# Patient Record
Sex: Female | Born: 1945 | Race: White | Hispanic: No | Marital: Married | Smoking: Never smoker
Health system: Southern US, Community
[De-identification: ages and names within clinical notes are randomized; demographics above are authoritative.]

## PROBLEM LIST (undated history)

## (undated) DIAGNOSIS — K219 Gastro-esophageal reflux disease without esophagitis: Secondary | ICD-10-CM

## (undated) DIAGNOSIS — Z860100 Personal history of colon polyps, unspecified: Secondary | ICD-10-CM

## (undated) DIAGNOSIS — E785 Hyperlipidemia, unspecified: Secondary | ICD-10-CM

## (undated) DIAGNOSIS — M349 Systemic sclerosis, unspecified: Secondary | ICD-10-CM

## (undated) DIAGNOSIS — M810 Age-related osteoporosis without current pathological fracture: Secondary | ICD-10-CM

## (undated) DIAGNOSIS — G3184 Mild cognitive impairment, so stated: Secondary | ICD-10-CM

## (undated) DIAGNOSIS — R4189 Other symptoms and signs involving cognitive functions and awareness: Secondary | ICD-10-CM

## (undated) DIAGNOSIS — I7300 Raynaud's syndrome without gangrene: Secondary | ICD-10-CM

## (undated) DIAGNOSIS — E7800 Pure hypercholesterolemia, unspecified: Secondary | ICD-10-CM

## (undated) DIAGNOSIS — M25572 Pain in left ankle and joints of left foot: Secondary | ICD-10-CM

## (undated) DIAGNOSIS — I609 Nontraumatic subarachnoid hemorrhage, unspecified: Secondary | ICD-10-CM

## (undated) DIAGNOSIS — M21612 Bunion of left foot: Secondary | ICD-10-CM

## (undated) DIAGNOSIS — I10 Essential (primary) hypertension: Secondary | ICD-10-CM

## (undated) DIAGNOSIS — M2010 Hallux valgus (acquired), unspecified foot: Secondary | ICD-10-CM

## (undated) DIAGNOSIS — E0590 Thyrotoxicosis, unspecified without thyrotoxic crisis or storm: Secondary | ICD-10-CM

## (undated) DIAGNOSIS — H3530 Unspecified macular degeneration: Secondary | ICD-10-CM

## (undated) DIAGNOSIS — L821 Other seborrheic keratosis: Secondary | ICD-10-CM

## (undated) DIAGNOSIS — J310 Chronic rhinitis: Secondary | ICD-10-CM

## (undated) DIAGNOSIS — M109 Gout, unspecified: Secondary | ICD-10-CM

## (undated) HISTORY — DX: Systemic sclerosis, unspecified: M34.9

## (undated) HISTORY — PX: CHOLECYSTECTOMY: SHX55

## (undated) HISTORY — DX: Hallux valgus (acquired), unspecified foot: M20.10

## (undated) HISTORY — DX: Personal history of colon polyps, unspecified: Z86.0100

## (undated) HISTORY — PX: MASTECTOMY PARTIAL / LUMPECTOMY: SUR851

## (undated) HISTORY — DX: Mild cognitive impairment of uncertain or unknown etiology: G31.84

## (undated) HISTORY — DX: Raynaud's syndrome without gangrene: I73.00

## (undated) HISTORY — DX: Age-related osteoporosis without current pathological fracture: M81.0

## (undated) HISTORY — DX: Hyperlipidemia, unspecified: E78.5

## (undated) HISTORY — DX: Other seborrheic keratosis: L82.1

## (undated) HISTORY — DX: Personal history of colonic polyps: Z86.010

## (undated) HISTORY — DX: Nontraumatic subarachnoid hemorrhage, unspecified: I60.9

## (undated) HISTORY — DX: Unspecified macular degeneration: H35.30

## (undated) HISTORY — DX: Essential (primary) hypertension: I10

## (undated) HISTORY — DX: Pure hypercholesterolemia, unspecified: E78.00

## (undated) HISTORY — DX: Bunion of left foot: M21.612

## (undated) HISTORY — DX: Gastro-esophageal reflux disease without esophagitis: K21.9

## (undated) HISTORY — DX: Other symptoms and signs involving cognitive functions and awareness: R41.89

## (undated) HISTORY — DX: Gout, unspecified: M10.9

## (undated) HISTORY — DX: Chronic rhinitis: J31.0

## (undated) HISTORY — PX: TUBAL LIGATION: SHX77

## (undated) HISTORY — DX: Thyrotoxicosis, unspecified without thyrotoxic crisis or storm: E05.90

## (undated) HISTORY — DX: Pain in left ankle and joints of left foot: M25.572

---

## 1998-07-25 ENCOUNTER — Encounter: Admission: RE | Admit: 1998-07-25 | Discharge: 1998-10-23 | Payer: Self-pay | Admitting: Family Medicine

## 1998-08-08 ENCOUNTER — Other Ambulatory Visit: Admission: RE | Admit: 1998-08-08 | Discharge: 1998-08-08 | Payer: Self-pay | Admitting: Obstetrics and Gynecology

## 1998-10-27 ENCOUNTER — Ambulatory Visit (HOSPITAL_BASED_OUTPATIENT_CLINIC_OR_DEPARTMENT_OTHER): Admission: RE | Admit: 1998-10-27 | Discharge: 1998-10-27 | Payer: Self-pay | Admitting: General Surgery

## 1999-08-17 ENCOUNTER — Other Ambulatory Visit: Admission: RE | Admit: 1999-08-17 | Discharge: 1999-08-17 | Payer: Self-pay | Admitting: General Surgery

## 1999-08-30 ENCOUNTER — Other Ambulatory Visit: Admission: RE | Admit: 1999-08-30 | Discharge: 1999-08-30 | Payer: Self-pay | Admitting: Obstetrics and Gynecology

## 1999-10-25 ENCOUNTER — Encounter (INDEPENDENT_AMBULATORY_CARE_PROVIDER_SITE_OTHER): Payer: Self-pay | Admitting: *Deleted

## 1999-10-25 ENCOUNTER — Ambulatory Visit (HOSPITAL_BASED_OUTPATIENT_CLINIC_OR_DEPARTMENT_OTHER): Admission: RE | Admit: 1999-10-25 | Discharge: 1999-10-25 | Payer: Self-pay | Admitting: General Surgery

## 2000-09-16 ENCOUNTER — Other Ambulatory Visit: Admission: RE | Admit: 2000-09-16 | Discharge: 2000-09-16 | Payer: Self-pay | Admitting: Family Medicine

## 2001-03-23 ENCOUNTER — Other Ambulatory Visit: Admission: RE | Admit: 2001-03-23 | Discharge: 2001-03-23 | Payer: Self-pay | Admitting: Obstetrics and Gynecology

## 2001-04-30 ENCOUNTER — Ambulatory Visit (HOSPITAL_COMMUNITY): Admission: RE | Admit: 2001-04-30 | Discharge: 2001-04-30 | Payer: Self-pay | Admitting: Gastroenterology

## 2001-06-12 ENCOUNTER — Ambulatory Visit (HOSPITAL_COMMUNITY): Admission: RE | Admit: 2001-06-12 | Discharge: 2001-06-12 | Payer: Self-pay | Admitting: Rheumatology

## 2001-06-29 ENCOUNTER — Encounter: Admission: RE | Admit: 2001-06-29 | Discharge: 2001-06-29 | Payer: Self-pay | Admitting: Rheumatology

## 2001-06-29 ENCOUNTER — Encounter: Payer: Self-pay | Admitting: Rheumatology

## 2002-10-14 ENCOUNTER — Ambulatory Visit (HOSPITAL_COMMUNITY): Admission: RE | Admit: 2002-10-14 | Discharge: 2002-10-14 | Payer: Self-pay | Admitting: Rheumatology

## 2003-12-31 HISTORY — PX: BREAST EXCISIONAL BIOPSY: SUR124

## 2004-01-19 ENCOUNTER — Other Ambulatory Visit: Admission: RE | Admit: 2004-01-19 | Discharge: 2004-01-19 | Payer: Self-pay | Admitting: Family Medicine

## 2004-01-25 ENCOUNTER — Encounter: Admission: RE | Admit: 2004-01-25 | Discharge: 2004-01-25 | Payer: Self-pay | Admitting: Family Medicine

## 2004-03-13 ENCOUNTER — Ambulatory Visit (HOSPITAL_COMMUNITY): Admission: RE | Admit: 2004-03-13 | Discharge: 2004-03-13 | Payer: Self-pay | Admitting: Rheumatology

## 2005-06-20 ENCOUNTER — Encounter: Admission: RE | Admit: 2005-06-20 | Discharge: 2005-06-20 | Payer: Self-pay | Admitting: Rheumatology

## 2006-09-15 ENCOUNTER — Ambulatory Visit: Payer: Self-pay | Admitting: Family Medicine

## 2006-10-09 ENCOUNTER — Encounter: Payer: Self-pay | Admitting: Family Medicine

## 2006-11-05 ENCOUNTER — Ambulatory Visit: Payer: Self-pay | Admitting: Family Medicine

## 2006-11-18 ENCOUNTER — Ambulatory Visit: Payer: Self-pay | Admitting: Family Medicine

## 2006-11-18 LAB — CONVERTED CEMR LAB
Chloride: 107 meq/L (ref 96–112)
Creatinine, Ser: 0.9 mg/dL (ref 0.4–1.2)
Glomerular Filtration Rate, Af Am: 82 mL/min/{1.73_m2}
Glucose, Bld: 71 mg/dL (ref 70–99)
MCHC: 33.3 g/dL (ref 30.0–36.0)
MCV: 91 fL (ref 78.0–100.0)
RBC: 4.48 M/uL (ref 3.87–5.11)
RDW: 11.9 % (ref 11.5–14.6)
Sodium: 143 meq/L (ref 135–145)

## 2006-11-26 ENCOUNTER — Ambulatory Visit: Payer: Self-pay | Admitting: Family Medicine

## 2006-12-17 ENCOUNTER — Ambulatory Visit: Payer: Self-pay | Admitting: Family Medicine

## 2006-12-17 LAB — CONVERTED CEMR LAB
ALT: 20 units/L (ref 0–40)
Creatinine, Ser: 0.9 mg/dL (ref 0.4–1.2)
Creatinine,U: 221 mg/dL
HDL: 38.4 mg/dL — ABNORMAL LOW (ref >39.0)
LDL Cholesterol: 67 mg/dL (ref 0–99)
Potassium: 4.2 meq/L (ref 3.5–5.1)
Sodium: 144 meq/L (ref 135–145)
Triglyceride fasting, serum: 116 mg/dL (ref 0–149)
VLDL: 23 mg/dL (ref 0–40)
Vitamin B-12: 380 pg/mL (ref 211–911)

## 2006-12-25 ENCOUNTER — Encounter: Admission: RE | Admit: 2006-12-25 | Discharge: 2006-12-25 | Payer: Self-pay | Admitting: Family Medicine

## 2007-01-21 DIAGNOSIS — I10 Essential (primary) hypertension: Secondary | ICD-10-CM | POA: Insufficient documentation

## 2007-01-21 DIAGNOSIS — E785 Hyperlipidemia, unspecified: Secondary | ICD-10-CM | POA: Insufficient documentation

## 2007-01-21 DIAGNOSIS — E119 Type 2 diabetes mellitus without complications: Secondary | ICD-10-CM | POA: Insufficient documentation

## 2007-01-21 HISTORY — DX: Essential (primary) hypertension: I10

## 2007-01-21 HISTORY — DX: Type 2 diabetes mellitus without complications: E11.9

## 2007-01-21 HISTORY — DX: Hyperlipidemia, unspecified: E78.5

## 2007-02-17 ENCOUNTER — Encounter (INDEPENDENT_AMBULATORY_CARE_PROVIDER_SITE_OTHER): Payer: Self-pay | Admitting: Family Medicine

## 2007-02-17 ENCOUNTER — Other Ambulatory Visit: Admission: RE | Admit: 2007-02-17 | Discharge: 2007-02-17 | Payer: Self-pay | Admitting: Family Medicine

## 2007-02-17 ENCOUNTER — Ambulatory Visit: Payer: Self-pay | Admitting: Family Medicine

## 2007-02-17 LAB — CONVERTED CEMR LAB
ALT: 24 units/L (ref 0–40)
BUN: 8 mg/dL (ref 6–23)
Basophils Absolute: 0.2 10*3/uL — ABNORMAL HIGH (ref 0.0–0.1)
Cholesterol: 142 mg/dL (ref 0–200)
Eosinophils Absolute: 0.1 10*3/uL (ref 0.0–0.6)
GFR calc Af Amer: 94 mL/min
GFR calc non Af Amer: 78 mL/min
HDL: 39 mg/dL (ref >39.0)
Hemoglobin: 13.4 g/dL (ref 12.0–15.0)
Hgb A1c MFr Bld: 5.6 % (ref 4.6–6.0)
LDL Cholesterol: 79 mg/dL (ref 0–99)
Lymphocytes Relative: 14.9 % (ref 12.0–46.0)
MCHC: 33.2 g/dL (ref 30.0–36.0)
MCV: 90.9 fL (ref 78.0–100.0)
Monocytes Absolute: 0.5 10*3/uL (ref 0.2–0.7)
Monocytes Relative: 5.9 % (ref 3.0–11.0)
Neutro Abs: 6 10*3/uL (ref 1.4–7.7)
Potassium: 4.3 meq/L (ref 3.5–5.1)
RDW: 12 % (ref 11.5–14.6)

## 2007-02-23 ENCOUNTER — Ambulatory Visit: Payer: Self-pay | Admitting: Family Medicine

## 2007-02-27 ENCOUNTER — Ambulatory Visit: Payer: Self-pay | Admitting: Family Medicine

## 2007-04-24 ENCOUNTER — Ambulatory Visit: Payer: Self-pay | Admitting: Family Medicine

## 2007-05-27 ENCOUNTER — Encounter (INDEPENDENT_AMBULATORY_CARE_PROVIDER_SITE_OTHER): Payer: Self-pay | Admitting: Family Medicine

## 2007-05-27 ENCOUNTER — Encounter: Payer: Self-pay | Admitting: Family Medicine

## 2007-05-27 ENCOUNTER — Ambulatory Visit: Payer: Self-pay | Admitting: Family Medicine

## 2007-05-28 LAB — CONVERTED CEMR LAB
Calcium: 9 mg/dL (ref 8.4–10.5)
Chloride: 109 meq/L (ref 96–112)
Creatinine,U: 174.8 mg/dL
GFR calc Af Amer: 93 mL/min
GFR calc non Af Amer: 77 mL/min
Hgb A1c MFr Bld: 6.1 % — ABNORMAL HIGH (ref 4.6–6.0)
LDL Cholesterol: 115 mg/dL — ABNORMAL HIGH (ref 0–99)
Microalb Creat Ratio: 3.4 mg/g (ref 0.0–30.0)
Total CHOL/HDL Ratio: 4.3
VLDL: 21 mg/dL (ref 0–40)

## 2007-05-29 ENCOUNTER — Telehealth (INDEPENDENT_AMBULATORY_CARE_PROVIDER_SITE_OTHER): Payer: Self-pay | Admitting: *Deleted

## 2007-06-09 ENCOUNTER — Telehealth (INDEPENDENT_AMBULATORY_CARE_PROVIDER_SITE_OTHER): Payer: Self-pay | Admitting: *Deleted

## 2007-09-01 ENCOUNTER — Ambulatory Visit: Payer: Self-pay | Admitting: Family Medicine

## 2007-09-01 ENCOUNTER — Telehealth (INDEPENDENT_AMBULATORY_CARE_PROVIDER_SITE_OTHER): Payer: Self-pay | Admitting: *Deleted

## 2007-09-02 ENCOUNTER — Telehealth (INDEPENDENT_AMBULATORY_CARE_PROVIDER_SITE_OTHER): Payer: Self-pay | Admitting: *Deleted

## 2007-09-02 LAB — CONVERTED CEMR LAB
Chloride: 109 meq/L (ref 96–112)
GFR calc Af Amer: 93 mL/min
GFR calc non Af Amer: 77 mL/min
Glucose, Bld: 115 mg/dL — ABNORMAL HIGH (ref 70–99)
HDL: 34.6 mg/dL — ABNORMAL LOW (ref >39.0)
Hgb A1c MFr Bld: 5.7 % (ref 4.6–6.0)
LDL Cholesterol: 82 mg/dL (ref 0–99)
Sodium: 146 meq/L — ABNORMAL HIGH (ref 135–145)
Total CHOL/HDL Ratio: 4.3
VLDL: 33 mg/dL (ref 0–40)

## 2007-09-09 ENCOUNTER — Ambulatory Visit: Payer: Self-pay | Admitting: Family Medicine

## 2007-09-09 LAB — CONVERTED CEMR LAB: Potassium: 4.6 meq/L (ref 3.5–5.1)

## 2007-09-10 ENCOUNTER — Telehealth (INDEPENDENT_AMBULATORY_CARE_PROVIDER_SITE_OTHER): Payer: Self-pay | Admitting: *Deleted

## 2007-10-13 ENCOUNTER — Encounter (INDEPENDENT_AMBULATORY_CARE_PROVIDER_SITE_OTHER): Payer: Self-pay | Admitting: Family Medicine

## 2007-10-27 ENCOUNTER — Ambulatory Visit: Payer: Self-pay | Admitting: Family Medicine

## 2007-11-20 ENCOUNTER — Encounter (INDEPENDENT_AMBULATORY_CARE_PROVIDER_SITE_OTHER): Payer: Self-pay | Admitting: Family Medicine

## 2007-12-02 ENCOUNTER — Ambulatory Visit: Payer: Self-pay | Admitting: Family Medicine

## 2007-12-02 DIAGNOSIS — J309 Allergic rhinitis, unspecified: Secondary | ICD-10-CM

## 2007-12-02 HISTORY — DX: Allergic rhinitis, unspecified: J30.9

## 2007-12-03 ENCOUNTER — Encounter (INDEPENDENT_AMBULATORY_CARE_PROVIDER_SITE_OTHER): Payer: Self-pay | Admitting: Family Medicine

## 2007-12-03 LAB — CONVERTED CEMR LAB
CO2: 28 meq/L (ref 19–32)
Chloride: 104 meq/L (ref 96–112)
Creatinine, Ser: 0.6 mg/dL (ref 0.4–1.2)
Glucose, Bld: 144 mg/dL — ABNORMAL HIGH (ref 70–99)
Hgb A1c MFr Bld: 5.7 % (ref 4.6–6.0)
Potassium: 4.4 meq/L (ref 3.5–5.1)
Sodium: 140 meq/L (ref 135–145)

## 2007-12-04 ENCOUNTER — Telehealth (INDEPENDENT_AMBULATORY_CARE_PROVIDER_SITE_OTHER): Payer: Self-pay | Admitting: *Deleted

## 2007-12-04 ENCOUNTER — Encounter (INDEPENDENT_AMBULATORY_CARE_PROVIDER_SITE_OTHER): Payer: Self-pay | Admitting: *Deleted

## 2007-12-25 ENCOUNTER — Telehealth (INDEPENDENT_AMBULATORY_CARE_PROVIDER_SITE_OTHER): Payer: Self-pay | Admitting: *Deleted

## 2007-12-29 ENCOUNTER — Telehealth (INDEPENDENT_AMBULATORY_CARE_PROVIDER_SITE_OTHER): Payer: Self-pay | Admitting: *Deleted

## 2008-02-02 ENCOUNTER — Telehealth (INDEPENDENT_AMBULATORY_CARE_PROVIDER_SITE_OTHER): Payer: Self-pay | Admitting: *Deleted

## 2008-03-08 ENCOUNTER — Ambulatory Visit: Payer: Self-pay | Admitting: Family Medicine

## 2008-03-08 DIAGNOSIS — L821 Other seborrheic keratosis: Secondary | ICD-10-CM | POA: Insufficient documentation

## 2008-03-08 HISTORY — DX: Other seborrheic keratosis: L82.1

## 2008-03-08 LAB — CONVERTED CEMR LAB
AST: 23 units/L (ref 0–37)
BUN: 10 mg/dL (ref 6–23)
CO2: 31 meq/L (ref 19–32)
Calcium: 9.7 mg/dL (ref 8.4–10.5)
Creatinine,U: 344.2 mg/dL
GFR calc Af Amer: 109 mL/min
GFR calc non Af Amer: 90 mL/min
LDL Cholesterol: 72 mg/dL (ref 0–99)
Microalb Creat Ratio: 18.6 mg/g (ref 0.0–30.0)
Microalb, Ur: 6.4 mg/dL — ABNORMAL HIGH (ref 0.0–1.9)
Potassium: 5 meq/L (ref 3.5–5.1)
Total CHOL/HDL Ratio: 4
Triglycerides: 152 mg/dL — ABNORMAL HIGH (ref 0–149)
VLDL: 30 mg/dL (ref 0–40)

## 2008-03-09 ENCOUNTER — Encounter (INDEPENDENT_AMBULATORY_CARE_PROVIDER_SITE_OTHER): Payer: Self-pay | Admitting: *Deleted

## 2008-04-12 ENCOUNTER — Telehealth (INDEPENDENT_AMBULATORY_CARE_PROVIDER_SITE_OTHER): Payer: Self-pay | Admitting: *Deleted

## 2008-07-11 ENCOUNTER — Ambulatory Visit: Payer: Self-pay | Admitting: Family Medicine

## 2008-07-11 DIAGNOSIS — M349 Systemic sclerosis, unspecified: Secondary | ICD-10-CM | POA: Insufficient documentation

## 2008-07-22 ENCOUNTER — Telehealth (INDEPENDENT_AMBULATORY_CARE_PROVIDER_SITE_OTHER): Payer: Self-pay | Admitting: *Deleted

## 2008-07-22 ENCOUNTER — Encounter (INDEPENDENT_AMBULATORY_CARE_PROVIDER_SITE_OTHER): Payer: Self-pay | Admitting: *Deleted

## 2008-07-22 LAB — CONVERTED CEMR LAB
ALT: 25 units/L (ref 0–35)
CO2: 31 meq/L (ref 19–32)
Calcium: 9.8 mg/dL (ref 8.4–10.5)
Cholesterol: 147 mg/dL (ref 0–200)
Creatinine, Ser: 0.8 mg/dL (ref 0.4–1.2)
Creatinine,U: 81.2 mg/dL
GFR calc Af Amer: 93 mL/min
HDL: 32.5 mg/dL — ABNORMAL LOW (ref >39.0)
Microalb Creat Ratio: 2.5 mg/g (ref 0.0–30.0)
Microalb, Ur: 0.2 mg/dL (ref 0.0–1.9)
Sodium: 143 meq/L (ref 135–145)
Total Bilirubin: 1.1 mg/dL (ref 0.3–1.2)
Total CHOL/HDL Ratio: 4.5
Total Protein: 7.5 g/dL (ref 6.0–8.3)
Triglycerides: 203 mg/dL (ref 0–149)

## 2008-09-22 ENCOUNTER — Ambulatory Visit: Payer: Self-pay | Admitting: Family Medicine

## 2008-10-20 ENCOUNTER — Encounter (INDEPENDENT_AMBULATORY_CARE_PROVIDER_SITE_OTHER): Payer: Self-pay | Admitting: *Deleted

## 2008-10-20 ENCOUNTER — Ambulatory Visit: Payer: Self-pay | Admitting: Family Medicine

## 2008-10-20 ENCOUNTER — Encounter: Payer: Self-pay | Admitting: Family Medicine

## 2008-10-20 DIAGNOSIS — F329 Major depressive disorder, single episode, unspecified: Secondary | ICD-10-CM | POA: Insufficient documentation

## 2008-10-20 HISTORY — DX: Major depressive disorder, single episode, unspecified: F32.9

## 2008-10-20 LAB — CONVERTED CEMR LAB
Alkaline Phosphatase: 63 units/L (ref 39–117)
Bilirubin, Direct: 0.2 mg/dL (ref 0.0–0.3)
Chloride: 107 meq/L (ref 96–112)
GFR calc Af Amer: 81 mL/min
GFR calc non Af Amer: 67 mL/min
LDL Cholesterol: 77 mg/dL (ref 0–99)
Potassium: 4.4 meq/L (ref 3.5–5.1)
Sodium: 142 meq/L (ref 135–145)
Total Bilirubin: 1.1 mg/dL (ref 0.3–1.2)
VLDL: 28 mg/dL (ref 0–40)

## 2008-10-21 ENCOUNTER — Encounter: Payer: Self-pay | Admitting: Family Medicine

## 2008-10-25 ENCOUNTER — Telehealth (INDEPENDENT_AMBULATORY_CARE_PROVIDER_SITE_OTHER): Payer: Self-pay | Admitting: *Deleted

## 2009-01-13 ENCOUNTER — Telehealth (INDEPENDENT_AMBULATORY_CARE_PROVIDER_SITE_OTHER): Payer: Self-pay | Admitting: *Deleted

## 2009-01-26 ENCOUNTER — Ambulatory Visit: Payer: Self-pay | Admitting: Family Medicine

## 2009-01-26 DIAGNOSIS — R252 Cramp and spasm: Secondary | ICD-10-CM | POA: Insufficient documentation

## 2009-01-27 ENCOUNTER — Encounter (INDEPENDENT_AMBULATORY_CARE_PROVIDER_SITE_OTHER): Payer: Self-pay | Admitting: *Deleted

## 2009-01-27 LAB — CONVERTED CEMR LAB
BUN: 9 mg/dL (ref 6–23)
CO2: 31 meq/L (ref 19–32)
Chloride: 108 meq/L (ref 96–112)
GFR calc non Af Amer: 90 mL/min
Glucose, Bld: 111 mg/dL — ABNORMAL HIGH (ref 70–99)
Potassium: 4.6 meq/L (ref 3.5–5.1)

## 2009-02-27 ENCOUNTER — Telehealth (INDEPENDENT_AMBULATORY_CARE_PROVIDER_SITE_OTHER): Payer: Self-pay | Admitting: *Deleted

## 2009-03-02 ENCOUNTER — Telehealth (INDEPENDENT_AMBULATORY_CARE_PROVIDER_SITE_OTHER): Payer: Self-pay | Admitting: *Deleted

## 2009-03-03 ENCOUNTER — Telehealth (INDEPENDENT_AMBULATORY_CARE_PROVIDER_SITE_OTHER): Payer: Self-pay | Admitting: *Deleted

## 2009-03-14 ENCOUNTER — Encounter: Payer: Self-pay | Admitting: Family Medicine

## 2009-03-14 ENCOUNTER — Ambulatory Visit: Payer: Self-pay | Admitting: Family Medicine

## 2009-04-27 ENCOUNTER — Telehealth: Payer: Self-pay | Admitting: Family Medicine

## 2009-04-28 ENCOUNTER — Other Ambulatory Visit: Admission: RE | Admit: 2009-04-28 | Discharge: 2009-04-28 | Payer: Self-pay | Admitting: Family Medicine

## 2009-04-28 ENCOUNTER — Encounter: Payer: Self-pay | Admitting: Family Medicine

## 2009-04-28 ENCOUNTER — Ambulatory Visit: Payer: Self-pay | Admitting: Family Medicine

## 2009-05-01 ENCOUNTER — Encounter (INDEPENDENT_AMBULATORY_CARE_PROVIDER_SITE_OTHER): Payer: Self-pay | Admitting: *Deleted

## 2009-05-01 LAB — CONVERTED CEMR LAB
ALT: 21 units/L (ref 0–35)
Basophils Relative: 0.2 % (ref 0.0–3.0)
Chloride: 107 meq/L (ref 96–112)
Eosinophils Relative: 0.9 % (ref 0.0–5.0)
GFR calc non Af Amer: 66.97 mL/min (ref >60)
Glucose, Bld: 156 mg/dL — ABNORMAL HIGH (ref 70–99)
HDL: 40.9 mg/dL (ref >39.00)
LDL Cholesterol: 102 mg/dL — ABNORMAL HIGH (ref 0–99)
Lymphocytes Relative: 19.6 % (ref 12.0–46.0)
MCV: 92.6 fL (ref 78.0–100.0)
Neutrophils Relative %: 72.5 % (ref 43.0–77.0)
Potassium: 4.9 meq/L (ref 3.5–5.1)
RBC: 4.46 M/uL (ref 3.87–5.11)
Sodium: 144 meq/L (ref 135–145)
Total Bilirubin: 1.2 mg/dL (ref 0.3–1.2)
Total Protein: 7.5 g/dL (ref 6.0–8.3)
VLDL: 21.2 mg/dL (ref 0.0–40.0)
WBC: 6.4 10*3/uL (ref 4.5–10.5)

## 2009-05-03 ENCOUNTER — Encounter (INDEPENDENT_AMBULATORY_CARE_PROVIDER_SITE_OTHER): Payer: Self-pay | Admitting: *Deleted

## 2009-07-27 ENCOUNTER — Ambulatory Visit: Payer: Self-pay | Admitting: Family Medicine

## 2009-07-27 DIAGNOSIS — L989 Disorder of the skin and subcutaneous tissue, unspecified: Secondary | ICD-10-CM | POA: Insufficient documentation

## 2009-07-31 ENCOUNTER — Encounter (INDEPENDENT_AMBULATORY_CARE_PROVIDER_SITE_OTHER): Payer: Self-pay | Admitting: *Deleted

## 2009-07-31 LAB — CONVERTED CEMR LAB: Hgb A1c MFr Bld: 5.7 % (ref 4.6–6.5)

## 2009-08-09 ENCOUNTER — Telehealth (INDEPENDENT_AMBULATORY_CARE_PROVIDER_SITE_OTHER): Payer: Self-pay | Admitting: *Deleted

## 2009-08-21 ENCOUNTER — Telehealth (INDEPENDENT_AMBULATORY_CARE_PROVIDER_SITE_OTHER): Payer: Self-pay | Admitting: *Deleted

## 2009-09-28 ENCOUNTER — Ambulatory Visit: Payer: Self-pay | Admitting: Family Medicine

## 2009-10-23 ENCOUNTER — Encounter: Payer: Self-pay | Admitting: Family Medicine

## 2009-10-25 ENCOUNTER — Encounter (INDEPENDENT_AMBULATORY_CARE_PROVIDER_SITE_OTHER): Payer: Self-pay | Admitting: *Deleted

## 2009-11-14 ENCOUNTER — Ambulatory Visit: Payer: Self-pay | Admitting: Family

## 2009-11-14 LAB — CONVERTED CEMR LAB
ALT: 19 units/L (ref 0–35)
AST: 25 units/L (ref 0–37)
Chloride: 109 meq/L (ref 96–112)
HDL: 40.3 mg/dL (ref >39.00)
Hgb A1c MFr Bld: 5.5 % (ref 4.6–6.5)
Potassium: 5.2 meq/L — ABNORMAL HIGH (ref 3.5–5.1)
Triglycerides: 127 mg/dL (ref 0.0–149.0)

## 2009-11-15 ENCOUNTER — Encounter: Payer: Self-pay | Admitting: Family

## 2009-11-27 ENCOUNTER — Ambulatory Visit: Payer: Self-pay | Admitting: Family

## 2009-11-27 DIAGNOSIS — E875 Hyperkalemia: Secondary | ICD-10-CM | POA: Insufficient documentation

## 2009-11-27 HISTORY — DX: Hyperkalemia: E87.5

## 2009-11-27 LAB — CONVERTED CEMR LAB
CO2: 29 meq/L (ref 19–32)
Calcium: 9.2 mg/dL (ref 8.4–10.5)
Creatinine, Ser: 0.8 mg/dL (ref 0.4–1.2)
Glucose, Bld: 162 mg/dL — ABNORMAL HIGH (ref 70–99)
Sodium: 142 meq/L (ref 135–145)

## 2009-12-12 ENCOUNTER — Ambulatory Visit: Payer: Self-pay | Admitting: Family

## 2009-12-12 DIAGNOSIS — J329 Chronic sinusitis, unspecified: Secondary | ICD-10-CM | POA: Insufficient documentation

## 2009-12-12 HISTORY — DX: Chronic sinusitis, unspecified: J32.9

## 2010-01-25 ENCOUNTER — Telehealth (INDEPENDENT_AMBULATORY_CARE_PROVIDER_SITE_OTHER): Payer: Self-pay | Admitting: *Deleted

## 2010-02-13 ENCOUNTER — Encounter: Payer: Self-pay | Admitting: Family Medicine

## 2010-02-14 ENCOUNTER — Ambulatory Visit: Payer: Self-pay | Admitting: Family

## 2010-02-14 LAB — CONVERTED CEMR LAB
Alkaline Phosphatase: 76 units/L (ref 39–117)
Bilirubin, Direct: 0.1 mg/dL (ref 0.0–0.3)
CO2: 30 meq/L (ref 19–32)
Calcium: 9.2 mg/dL (ref 8.4–10.5)
Creatinine,U: 175.3 mg/dL
HDL: 45 mg/dL (ref >39.00)
Microalb Creat Ratio: 10.3 mg/g (ref 0.0–30.0)
Sodium: 144 meq/L (ref 135–145)
Total CHOL/HDL Ratio: 3
Total Protein: 7.2 g/dL (ref 6.0–8.3)

## 2010-03-14 ENCOUNTER — Ambulatory Visit: Payer: Self-pay | Admitting: Family

## 2010-03-29 ENCOUNTER — Encounter (INDEPENDENT_AMBULATORY_CARE_PROVIDER_SITE_OTHER): Payer: Self-pay | Admitting: *Deleted

## 2010-04-10 ENCOUNTER — Encounter: Payer: Self-pay | Admitting: Family Medicine

## 2010-04-19 ENCOUNTER — Ambulatory Visit: Payer: Self-pay | Admitting: Family Medicine

## 2010-04-19 ENCOUNTER — Telehealth (INDEPENDENT_AMBULATORY_CARE_PROVIDER_SITE_OTHER): Payer: Self-pay | Admitting: *Deleted

## 2010-04-19 DIAGNOSIS — M25579 Pain in unspecified ankle and joints of unspecified foot: Secondary | ICD-10-CM | POA: Insufficient documentation

## 2010-05-23 ENCOUNTER — Telehealth (INDEPENDENT_AMBULATORY_CARE_PROVIDER_SITE_OTHER): Payer: Self-pay | Admitting: *Deleted

## 2010-05-29 ENCOUNTER — Ambulatory Visit: Payer: Self-pay | Admitting: Family Medicine

## 2010-05-30 ENCOUNTER — Telehealth (INDEPENDENT_AMBULATORY_CARE_PROVIDER_SITE_OTHER): Payer: Self-pay | Admitting: *Deleted

## 2010-05-30 LAB — CONVERTED CEMR LAB: Hgb A1c MFr Bld: 6 % (ref 4.6–6.5)

## 2010-08-29 ENCOUNTER — Ambulatory Visit: Payer: Self-pay | Admitting: Family Medicine

## 2010-08-30 LAB — CONVERTED CEMR LAB
ALT: 18 units/L (ref 0–35)
AST: 19 units/L (ref 0–37)
Albumin: 3.7 g/dL (ref 3.5–5.2)
Basophils Absolute: 0.1 10*3/uL (ref 0.0–0.1)
CO2: 28 meq/L (ref 19–32)
Calcium: 9.2 mg/dL (ref 8.4–10.5)
Cholesterol: 145 mg/dL (ref 0–200)
Eosinophils Relative: 1.9 % (ref 0.0–5.0)
GFR calc non Af Amer: 66.69 mL/min (ref >60)
HDL: 40.1 mg/dL (ref >39.00)
Hgb A1c MFr Bld: 6.2 % (ref 4.6–6.5)
Monocytes Absolute: 0.4 10*3/uL (ref 0.1–1.0)
Monocytes Relative: 6.1 % (ref 3.0–12.0)
Neutrophils Relative %: 59.6 % (ref 43.0–77.0)
Platelets: 180 10*3/uL (ref 150.0–400.0)
RDW: 13.1 % (ref 11.5–14.6)
Sodium: 142 meq/L (ref 135–145)
TSH: 2.31 microintl units/mL (ref 0.35–5.50)
Total Protein: 6.9 g/dL (ref 6.0–8.3)
Triglycerides: 113 mg/dL (ref 0.0–149.0)
WBC: 5.9 10*3/uL (ref 4.5–10.5)

## 2010-10-24 ENCOUNTER — Encounter: Payer: Self-pay | Admitting: Family Medicine

## 2010-11-01 ENCOUNTER — Encounter (INDEPENDENT_AMBULATORY_CARE_PROVIDER_SITE_OTHER): Payer: Self-pay | Admitting: *Deleted

## 2010-11-14 ENCOUNTER — Ambulatory Visit: Payer: Self-pay | Admitting: Family Medicine

## 2010-11-14 DIAGNOSIS — J069 Acute upper respiratory infection, unspecified: Secondary | ICD-10-CM

## 2010-11-14 HISTORY — DX: Acute upper respiratory infection, unspecified: J06.9

## 2010-11-29 ENCOUNTER — Ambulatory Visit: Payer: Self-pay | Admitting: Family Medicine

## 2010-11-30 LAB — CONVERTED CEMR LAB: Hgb A1c MFr Bld: 6 % (ref 4.6–6.5)

## 2011-01-20 ENCOUNTER — Encounter: Payer: Self-pay | Admitting: Rheumatology

## 2011-01-22 ENCOUNTER — Telehealth (INDEPENDENT_AMBULATORY_CARE_PROVIDER_SITE_OTHER): Payer: Self-pay | Admitting: *Deleted

## 2011-01-29 NOTE — Assessment & Plan Note (Signed)
Summary: cough/cbs   Vital Signs:  Patient profile:   65 year old female Menstrual status:  postmenopausal Weight:      154 pounds O2 Sat:      98 % on Room air Temp:     98.5 degrees F oral Pulse rate:   94 / minute BP sitting:   124 / 80  (left arm)  Vitals Entered By: Doristine Devoid CMA (November 14, 2010 1:59 PM)  O2 Flow:  Room air CC: cough mainly dry but has been keeping pt up at night   History of Present Illness: 65 yo woman here today for cough.  sxs started Saturday w/ acid reflux but cough has persisted.  no fever.  mild nasal congestion, no ear pain.  cough is intermittantly productive.  + sick contacts.  cough keeping pt awake at night.  using [facility] w/out relief.  heading out of town this weekend.  Current Medications (verified): 1)  Glucotrol Xl 5 Mg  Tb24 (Glipizide) .... Take One Tablet By Mouth Daily 2)  Zocor 80 Mg  Tabs (Simvastatin) .... Take One Tablet Daily 3)  Prilosec 40 Mg  Cpdr (Omeprazole) .... Take 2  Tablets Daily 4)  Fluticasone Propionate 50 Mcg/act  Susp (Fluticasone Propionate) .... Spray 2 Sprays in Each Nostril Daily 5)  Onetouch Ultra Test  Strp (Glucose Blood) .... Test Twice Daily 6)  Bayer Aspirin 325 Mg  Tabs (Aspirin) .Marland Kitchen.. 1 By Mouth Once Daily 7)  Vitamin D 1000 Unit Caps (Cholecalciferol) .... Once A Day By Mouth 8)  Daily Vitamins For Women  Tabs (Multiple Vitamins-Calcium) .Marland Kitchen.. 1 Per Day Po 9)  Lisinopril 10 Mg Tabs (Lisinopril) .... Take One Tablet Daily 10)  Lopressor 50 Mg Tabs (Metoprolol Tartrate) .... Take 1 Tablet By Mouth Once A Day  Allergies (verified): No Known Drug Allergies  Review of Systems      See HPI  Physical Exam  General:  Well-developed,well-nourished,in no acute distress; alert,appropriate and cooperative throughout examination Head:  Normocephalic and atraumatic without obvious abnormalities. No apparent alopecia or balding.  no TTP over sinuses Eyes:  no injxn or inflammation Ears:  External ear exam  shows no significant lesions or deformities.  Otoscopic examination reveals clear canals, tympanic membranes are intact bilaterally without bulging, retraction, inflammation or discharge. Hearing is grossly normal bilaterally. Nose:  External nasal examination shows no deformity or inflammation. Nasal mucosa are pink and moist without lesions or exudates. Mouth:  Oral mucosa and oropharynx without lesions or exudates.  Teeth in good repair.  + PND Neck:  No deformities, masses, or tenderness noted. Lungs:  Normal respiratory effort, chest expands symmetrically. Lungs are clear to auscultation, no crackles or wheezes.  + dry cough Heart:  Normal rate and regular rhythm. S1 and S2 normal without gallop, murmur, click, rub or other extra sounds.   Impression & Recommendations:  Problem # 1:  URI (ICD-465.9) Assessment New pt's sxs likely viral at this time.  as pt is going out of town script give for Azithromycin if no improvement.  reviewed supportive care and red flags that should prompt return.  Pt expresses understanding and is in agreement w/ this plan. Her updated medication list for this problem includes:    Bayer Aspirin 325 Mg Tabs (Aspirin) .Marland Kitchen... 1 by mouth once daily    Tessalon 200 Mg Caps (Benzonatate) .Marland Kitchen... Take one capsule by mouth three times a day as needed for cough    Cheratussin Ac 100-10 Mg/84ml Syrp (Guaifenesin-codeine) .Marland Kitchen... 1-2  tsps q4-6 as needed for cough.  will cause drowsiness.  Complete Medication List: 1)  Glucotrol Xl 5 Mg Tb24 (Glipizide) .... Take one tablet by mouth daily 2)  Zocor 80 Mg Tabs (Simvastatin) .... Take one tablet daily 3)  Prilosec 40 Mg Cpdr (Omeprazole) .... Take 2  tablets daily 4)  Fluticasone Propionate 50 Mcg/act Susp (Fluticasone propionate) .... Spray 2 sprays in each nostril daily 5)  Onetouch Ultra Test Strp (Glucose blood) .... Test twice daily 6)  Bayer Aspirin 325 Mg Tabs (Aspirin) .Marland Kitchen.. 1 by mouth once daily 7)  Vitamin D 1000 Unit  Caps (cholecalciferol)  .... Once a day by mouth 8)  Daily Vitamins For Women Tabs (Multiple vitamins-calcium) .Marland Kitchen.. 1 per day po 9)  Lisinopril 10 Mg Tabs (Lisinopril) .... Take one tablet daily 10)  Lopressor 50 Mg Tabs (Metoprolol tartrate) .... Take 1 tablet by mouth once a day 11)  Azithromycin 250 Mg Tabs (Azithromycin) .... 2 by  mouth today and then 1 daily for 4 days 12)  Tessalon 200 Mg Caps (Benzonatate) .... Take one capsule by mouth three times a day as needed for cough 13)  Cheratussin Ac 100-10 Mg/4ml Syrp (Guaifenesin-codeine) .Marland Kitchen.. 1-2 tsps q4-6 as needed for cough.  will cause drowsiness.  Patient Instructions: 1)  This is likely viral and should improve w/ time 2)  If cough is worsening or not improving by this weekend start the Azithromycin 3)  Use the tessalon as needed for daytime cough and the cheratussin  as needed for night cough (will cause drowsiness) 4)  Drink plenty of fluid 5)  Mucinex to thin congestion 6)  Hang in there!! Prescriptions: TESSALON 200 MG CAPS (BENZONATATE) Take one capsule by mouth three times a day as needed for cough  #60 x 0   Entered and Authorized by:   Neena Rhymes MD   Signed by:   Neena Rhymes MD on 11/14/2010   Method used:   Print then Give to Patient   RxID:   [id number] AZITHROMYCIN 250 MG  TABS (AZITHROMYCIN) 2 by  mouth today and then 1 daily for 4 days  #6 x 0   Entered and Authorized by:   Neena Rhymes MD   Signed by:   Neena Rhymes MD on 11/14/2010   Method used:   Print then Give to Patient   RxID:   [id number] CHERATUSSIN AC 100-10 MG/5ML SYRP (GUAIFENESIN-CODEINE) 1-2 tsps q4-6 as needed for cough.  will cause drowsiness.  #177ml x 0   Entered and Authorized by:   Neena Rhymes MD   Signed by:   Neena Rhymes MD on 11/14/2010   Method used:   Print then Give to Patient   RxID:   [id number]    Orders Added: 1)  Est. Patient Level III [56433]

## 2011-01-29 NOTE — Progress Notes (Signed)
Summary: zocor refill   Phone Note Refill Request Call back at [phone] Message from:  Pharmacy on May 30, 2010 12:40 PM  Refills Requested: Medication #1:  ZOCOR 80 MG  TABS Take one tablet daily   Dosage confirmed as above?Dosage Confirmed   Brand Name Necessary? No   Supply Requested: 3 months   Last Refilled: 02/24/2010 [facility] [address]  Next Appointment Scheduled: 8.31.11 Initial call taken by: Harold Barban,  May 30, 2010 12:40 PM    Prescriptions: ZOCOR 80 MG  TABS (SIMVASTATIN) Take one tablet daily  #90 x 0   Entered by:   Doristine Devoid   Authorized by:   Neena Rhymes MD   Signed by:   Doristine Devoid on 05/30/2010   Method used:   Electronically to        [facility] [phone]* (retail)       [address]       [place], [place]  [address]       Ph: [phone]       Fax: [phone]   RxID:   [id number]

## 2011-01-29 NOTE — Progress Notes (Signed)
Summary: xray results  Phone Note Outgoing Call   Call placed by: Doristine Devoid,  April 19, 2010 4:35 PM Call placed to: Patient Summary of Call: no obvious fx or injury.  should continue w/ plan discussed in office.  Follow-up for Phone Call        patient aware of results.......Marland KitchenDoristine Devoid  April 19, 2010 4:36 PM

## 2011-01-29 NOTE — Miscellaneous (Signed)
  Clinical Lists Changes  Observations: Added new observation of MAMMOGRAM: normal (10/24/2010 11:19)      Preventive Care Screening  Mammogram:    Date:  10/24/2010    Results:  normal

## 2011-01-29 NOTE — Assessment & Plan Note (Signed)
Summary: 3 mth fu/kdc   Vital Signs:  Patient profile:   65 year old female Menstrual status:  postmenopausal Weight:      152 pounds Pulse rate:   66 / minute BP sitting:   110 / 64  (left arm)  Vitals Entered By: Doristine Devoid (May 29, 2010 9:03 AM) CC: 3 month roa    History of Present Illness: 65 yo woman here today for 3 month f/u.  1) HTN- BP excellently controlled.  doing well on Norvasc and Lopressor.  no CP, SOB, HAs, visual changes  2) DM- CBGs are labile, highest has been 190.  lowest 108.  UTD on eye exam.  feels CBGs elevate in times of stress.  Current Medications (verified): 1)  Glucotrol Xl 5 Mg  Tb24 (Glipizide) .... Take One Tablet By Mouth Daily 2)  Zocor 80 Mg  Tabs (Simvastatin) .... Take One Tablet Daily 3)  Prilosec 40 Mg  Cpdr (Omeprazole) .... Take 2  Tablets Daily 4)  Fluticasone Propionate 50 Mcg/act  Susp (Fluticasone Propionate) .... Spray 2 Sprays in Each Nostril Daily 5)  Onetouch Ultra Test  Strp (Glucose Blood) .... Test Twice Daily 6)  Bayer Aspirin 325 Mg  Tabs (Aspirin) .Marland Kitchen.. 1 By Mouth Once Daily 7)  Vitamin D 1000 Unit Caps (Cholecalciferol) .... Once A Day By Mouth 8)  Daily Vitamins For Women  Tabs (Multiple Vitamins-Calcium) .Marland Kitchen.. 1 Per Day Po 9)  Norvasc 10 Mg Tabs (Amlodipine Besylate) .... One Tablet By Mouth Daily 10)  Lopressor 50 Mg Tabs (Metoprolol Tartrate) .... Take 1 Tablet By Mouth Once A Day 11)  Naproxen 500 Mg Tabs (Naproxen) .Marland Kitchen.. 1 Tab By Mouth Two Times A Day X7 Days and Then As Needed.  Take W/ Food.  Allergies (verified): No Known Drug Allergies  Past History:  Past Medical History: Last updated: 07/11/2008 Diabetes mellitus, type II Hyperlipidemia Hypertension Current Problems:  SEBORRHEIC KERATOSIS (ICD-702.19) PREVENTIVE HEALTH CARE (ICD-V70.0) RHINITIS (ICD-477.9) HYPERTENSION (ICD-401.9) HYPERLIPIDEMIA (ICD-272.4) DIABETES MELLITUS, TYPE II (ICD-250.00) Scleraderma  Review of Systems      See  HPI  Physical Exam  General:  Well-developed,well-nourished,in no acute distress; alert,appropriate and cooperative throughout examination Neck:  No deformities, masses, or tenderness noted. Lungs:  Normal respiratory effort, chest expands symmetrically. Lungs are clear to auscultation, no crackles or wheezes. Heart:  Normal rate and regular rhythm. S1 and S2 normal without gallop, murmur, click, rub or other extra sounds. Pulses:  +2 DP/PT Extremities:  some callouses on right second and third toes.  No open areas noted.    Impression & Recommendations:  Problem # 1:  HYPERTENSION (ICD-401.9) Assessment Unchanged BP well controlled.  asymptomatic.  no changes. Her updated medication list for this problem includes:    Norvasc 10 Mg Tabs (Amlodipine besylate) ..... One tablet by mouth daily    Lopressor 50 Mg Tabs (Metoprolol tartrate) .Marland Kitchen... Take 1 tablet by mouth once a day  Problem # 2:  DIABETES MELLITUS, TYPE II (ICD-250.00) Assessment: Unchanged labile CBGs due to stress.  check A1C and adjust meds as needed. Her updated medication list for this problem includes:    Glucotrol Xl 5 Mg Tb24 (Glipizide) .Marland Kitchen... Take one tablet by mouth daily    Bayer Aspirin 325 Mg Tabs (Aspirin) .Marland Kitchen... 1 by mouth once daily  Orders: Venipuncture ([address]) TLB-A1C / Hgb A1C (Glycohemoglobin) (83036-A1C)  Complete Medication List: 1)  Glucotrol Xl 5 Mg Tb24 (Glipizide) .... Take one tablet by mouth daily 2)  Zocor 80 Mg  Tabs (Simvastatin) .... Take one tablet daily 3)  Prilosec 40 Mg Cpdr (Omeprazole) .... Take 2  tablets daily 4)  Fluticasone Propionate 50 Mcg/act Susp (Fluticasone propionate) .... Spray 2 sprays in each nostril daily 5)  Onetouch Ultra Test Strp (Glucose blood) .... Test twice daily 6)  Bayer Aspirin 325 Mg Tabs (Aspirin) .Marland Kitchen.. 1 by mouth once daily 7)  Vitamin D 1000 Unit Caps (cholecalciferol)  .... Once a day by mouth 8)  Daily Vitamins For Women Tabs (Multiple  vitamins-calcium) .Marland Kitchen.. 1 per day po 9)  Norvasc 10 Mg Tabs (Amlodipine besylate) .... One tablet by mouth daily 10)  Lopressor 50 Mg Tabs (Metoprolol tartrate) .... Take 1 tablet by mouth once a day 11)  Naproxen 500 Mg Tabs (Naproxen) .Marland Kitchen.. 1 tab by mouth two times a day x7 days and then as needed.  take w/ food.  Patient Instructions: 1)  Please schedule a follow-up appointment in 3 months for your complete physical- do not eat before this appt. 2)  When you have 2 weeks of Norvasc remaining, please call and we'll call in your Lisinopril 10mg  3)  We'll notify you of your A1C 4)  Keep up the good work on diet and exercise- this will make a huge difference in your A1C 5)  Hang in there!!

## 2011-01-29 NOTE — Progress Notes (Signed)
Summary: glucotrol refill   Phone Note Refill Request Message from:  Fax from Pharmacy on May 23, 2010 2:04 PM  Refills Requested: Medication #1:  GLUCOTROL XL 5 MG  TB24 Take one tablet by mouth daily fax from [facility] - fax [phone]  - phone [phone]   Method Requested: Fax to Local Pharmacy Initial call taken by: Okey Regal [place],  May 23, 2010 2:06 PM    Prescriptions: GLUCOTROL XL 5 MG  TB24 (GLIPIZIDE) Take one tablet by mouth daily  #30 Tablet x 3   Entered by:   Doristine Devoid   Authorized by:   Neena Rhymes MD   Signed by:   Doristine Devoid on 05/23/2010   Method used:   Electronically to        [facility] [phone]* (retail)       [address]       [place], [place]  [address]       Ph: [phone]       Fax: [phone]   RxID:   [id number]

## 2011-01-29 NOTE — Assessment & Plan Note (Signed)
Summary: 3 mth fu/ns/kdc   Vital Signs:  Patient profile:   65 year old female Menstrual status:  postmenopausal Weight:      155 pounds Pulse rate:   72 / minute BP sitting:   140 / 78  (left arm)  Vitals Entered By: Doristine Devoid (February 14, 2010 8:13 AM) CC: 3 month roa    Primary Care Provider:  Laury Axon  CC:  3 month roa .  History of Present Illness: Ms Andrea Heath is a 65 year old female who presents today for routine follow up.  HTN-  has been limiting sodium, takes norvasc routinely.  DM2-  sugars have been "up and down",  Fasting 150 was the highest that she has seen, usually about 110 fasting.  Doesn't check regularly in PM unless AM numbers have been high.  Notes + compliance with DM diet.  Notes that she had a few times while exercising that she felt like her sugar was low- wasn't able to check.  Last dilated eye exam was October,  has not see podiatry- she declines referral to podiatry.    Depression- reports that this is well controlled.    Allergies: No Known Drug Allergies  Review of Systems       Denies edema, denies nocturia  Physical Exam  General:  Well-developed,well-nourished,in no acute distress; alert,appropriate and cooperative throughout examination Lungs:  Normal respiratory effort, chest expands symmetrically. Lungs are clear to auscultation, no crackles or wheezes. Heart:  Normal rate and regular rhythm. S1 and S2 normal without gallop, murmur, click, rub or other extra sounds. Extremities:  some callouses on right second and third toes.  No open areas noted.    Impression & Recommendations:  Problem # 1:  HYPERTENSION (ICD-401.9) Assessment Deteriorated Will add BB.  Patient has been told to avoid diuretics due to history of Lupus.  + history of hyperkalemia, will try to avoid ace Her updated medication list for this problem includes:    Norvasc 10 Mg Tabs (Amlodipine besylate) ..... One tablet by mouth daily    Lopressor 50 Mg Tabs  (Metoprolol tartrate) ..... One tablet by mouth bid  BP today: 140/78 Prior BP: 130/70 (12/12/2009)  Labs Reviewed: K+: 4.6 (11/27/2009) Creat: : 0.8 (11/27/2009)   Chol: 147 (11/14/2009)   HDL: 40.30 (11/14/2009)   LDL: 81 (11/14/2009)   TG: 127.0 (11/14/2009)  Problem # 2:  DIABETES MELLITUS, TYPE II (ICD-250.00) Assessment: Comment Only Repeat f/u A1C, declined podiatry, will also check urine for microalbumin. Her updated medication list for this problem includes:    Glucotrol Xl 5 Mg Tb24 (Glipizide) .Marland Kitchen... Take one tablet by mouth daily    Bayer Aspirin 325 Mg Tabs (Aspirin) .Marland Kitchen... 1 by mouth once daily  Labs Reviewed: Creat: 0.8 (11/27/2009)    Reviewed HgBA1c results: 5.5 (11/14/2009)  5.7 (07/27/2009)  Orders: TLB-BMP (Basic Metabolic Panel-BMET) (80048-METABOL) TLB-A1C / Hgb A1C (Glycohemoglobin) (83036-A1C) TLB-Microalbumin/Creat Ratio, Urine (82043-MALB)  Problem # 3:  HYPERLIPIDEMIA (ICD-272.4) Assessment: Comment Only Check FLP and LFT's Her updated medication list for this problem includes:    Zocor 80 Mg Tabs (Simvastatin) .Marland Kitchen... Take one tablet daily  Labs Reviewed: SGOT: 25 (11/14/2009)   SGPT: 19 (11/14/2009)   HDL:40.30 (11/14/2009), 40.90 (04/28/2009)  LDL:81 (11/14/2009), 102 (16/09/9603)  Chol:147 (11/14/2009), 164 (04/28/2009)  Trig:127.0 (11/14/2009), 106.0 (04/28/2009)  Orders: Venipuncture (54098) TLB-Lipid Panel (80061-LIPID) TLB-Hepatic/Liver Function Pnl (80076-HEPATIC)  Complete Medication List: 1)  Glucotrol Xl 5 Mg Tb24 (Glipizide) .... Take one tablet by mouth daily 2)  Zocor 80 Mg Tabs (Simvastatin) .... Take one tablet daily 3)  Prilosec 40 Mg Cpdr (Omeprazole) .... Take 2  tablets daily 4)  Fluticasone Propionate 50 Mcg/act Susp (Fluticasone propionate) .... Spray 2 sprays in each nostril daily 5)  Onetouch Ultra Test Strp (Glucose blood) .... Test twice daily 6)  Bayer Aspirin 325 Mg Tabs (Aspirin) .Marland Kitchen.. 1 by mouth once daily 7)   Vitamin D 400 Unit Caps (Cholecalciferol) .... Once a day by mouth 8)  Daily Vitamins For Women Tabs (Multiple vitamins-calcium) .Marland Kitchen.. 1 per day po 9)  Norvasc 10 Mg Tabs (Amlodipine besylate) .... One tablet by mouth daily 10)  Lopressor 50 Mg Tabs (Metoprolol tartrate) .... One tablet by mouth bid  Patient Instructions: 1)  Complete lab work today. 2)  Please schedule nurse visit for BP check in 1 month. 3)  Please schedule a follow-up appointment in 3 months. Prescriptions: NORVASC 10 MG TABS (AMLODIPINE BESYLATE) one tablet by mouth daily  #30 x 1   Entered and Authorized by:   Lemont Fillers FNP   Signed by:   Lemont Fillers FNP on 02/14/2010   Method used:   Electronically to        [facility] [phone]* (retail)       [address]       [place], [place]  [address]       Ph: [phone]       Fax: [phone]   RxID:   [id number] FLUTICASONE PROPIONATE 50 MCG/ACT  SUSP (FLUTICASONE PROPIONATE) Spray 2 sprays in each nostril daily  #1 x 3   Entered and Authorized by:   Lemont Fillers FNP   Signed by:   Lemont Fillers FNP on 02/14/2010   Method used:   Electronically to        [facility] [phone]* (retail)       [address]       [place], [place]  [address]       Ph: [phone]       Fax: [phone]   RxID:   [id number] LOPRESSOR 50 MG TABS (METOPROLOL TARTRATE) one tablet by mouth BID  #60 x 1   Entered and Authorized by:   Lemont Fillers FNP   Signed by:   Lemont Fillers FNP on 02/14/2010   Method used:   Electronically to        [facility] [phone]* (retail)       [address]       [place], [place]  [address]       Ph: [phone]       Fax: [phone]   RxID:   [id number]

## 2011-01-29 NOTE — Miscellaneous (Signed)
Summary: colonoscopy  Clinical Lists Changes  Observations: Added new observation of COLONOSCOPY:  Results: Normal. Location: [facility] Dr. Rodena Piety  (03/29/2010 16:27)        Colonoscopy  Procedure date:  03/29/2010  Findings:       Results: Normal. Location: [facility] Dr. Rodena Piety

## 2011-01-29 NOTE — Letter (Signed)
Summary: Letter Regarding Disease Mgmt Program/[facility]  Letter Regarding Disease Mgmt Program/[facility]   Imported By: Lanelle Bal 02/19/2010 12:44:03  _____________________________________________________________________  External Attachment:    Type:   Image     Comment:   External Document

## 2011-01-29 NOTE — Letter (Signed)
Summary: Cancer Screening/[employer] Personal[facility] Risk Profile  Cancer Screening/[facility] [employer] [facility] Risk Profile   Imported By: Lanelle Bal 09/06/2010 13:57:14  _____________________________________________________________________  External Attachment:    Type:   Image     Comment:   External Document

## 2011-01-29 NOTE — Assessment & Plan Note (Signed)
Summary: rto 3 months.pneumovax/cbs   Vital Signs:  Patient profile:   65 year old female Menstrual status:  postmenopausal Weight:      155 pounds BP sitting:   114 / 72  (left arm)  Vitals Entered By: Doristine Devoid CMA (November 29, 2010 9:04 AM) CC: 3 month roa- pneumonia shot    History of Present Illness: 65 yo woman here today for f/u on DM.  CBGs remain labile.  'i just don't eat like i'm supposed'.  going to [place] 3x/[place].  no symptomatic lows, CP, SOB, HAs, visual changes, edema.  Allergies (verified): No Known Drug Allergies  Review of Systems      See HPI  Physical Exam  General:  Well-developed,well-nourished,in no acute distress; alert,appropriate and cooperative throughout examination Head:  Normocephalic and atraumatic without obvious abnormalities. No apparent alopecia or balding. Neck:  No deformities, masses, or tenderness noted. Lungs:  Normal respiratory effort, chest expands symmetrically. Lungs are clear to auscultation, no crackles or wheezes. Heart:  Normal rate and regular rhythm. S1 and S2 normal without gallop, murmur, click, rub or other extra sounds. Pulses:  +2 carotid, radial, DP Extremities:  No clubbing, cyanosis, edema, or deformity noted   Diabetes Management Exam:    Foot Exam (with socks and/or shoes not present):       Sensory-Pinprick/Light touch:          Left medial foot (L-4): normal          Left dorsal foot (L-5): normal          Left lateral foot (S-1): normal          Right medial foot (L-4): normal          Right dorsal foot (L-5): normal          Right lateral foot (S-1): normal       Sensory-Monofilament:          Left foot: normal          Right foot: normal       Inspection:          Left foot: normal          Right foot: normal       Nails:          Left foot: normal          Right foot: normal   Impression & Recommendations:  Problem # 1:  DIABETES MELLITUS, TYPE II (ICD-250.00) Assessment  Unchanged asymptomatic.  check labs.  adjust meds as needed. Her updated medication list for this problem includes:    Glucotrol Xl 5 Mg Tb24 (Glipizide) .Marland Kitchen... Take one tablet by mouth daily    Bayer Aspirin 325 Mg Tabs (Aspirin) .Marland Kitchen... 1 by mouth once daily    Lisinopril 10 Mg Tabs (Lisinopril) .Marland Kitchen... Take one tablet daily  Orders: Venipuncture (32440) TLB-A1C / Hgb A1C (Glycohemoglobin) (83036-A1C) Specimen Handling (10272) Venipuncture (53664) TLB-A1C / Hgb A1C (Glycohemoglobin) (83036-A1C)  Complete Medication List: 1)  Glucotrol Xl 5 Mg Tb24 (Glipizide) .... Take one tablet by mouth daily 2)  Zocor 80 Mg Tabs (Simvastatin) .... Take one tablet daily 3)  Prilosec 40 Mg Cpdr (Omeprazole) .... Take 2  tablets daily 4)  Fluticasone Propionate 50 Mcg/act Susp (Fluticasone propionate) .... Spray 2 sprays in each nostril daily 5)  Onetouch Ultra Test Strp (Glucose blood) .... Test twice daily 6)  Bayer Aspirin 325 Mg Tabs (Aspirin) .Marland Kitchen.. 1 by mouth once daily 7)  Vitamin D 1000  Unit Caps (cholecalciferol)  .... Once a day by mouth 8)  Daily Vitamins For Women Tabs (Multiple vitamins-calcium) .Marland Kitchen.. 1 per day po 9)  Lisinopril 10 Mg Tabs (Lisinopril) .... Take one tablet daily 10)  Lopressor 50 Mg Tabs (Metoprolol tartrate) .... Take 1 tablet by mouth once a day 11)  Tessalon 200 Mg Caps (Benzonatate) .... Take one capsule by mouth three times a day as needed for cough 12)  Cheratussin Ac 100-10 Mg/29ml Syrp (Guaifenesin-codeine) .Marland Kitchen.. 1-2 tsps q4-6 as needed for cough.  will cause drowsiness.  Other Orders: Pneumococcal Vaccine (09735) Admin 1st Vaccine (32992)  Patient Instructions: 1)  Follow up in 3 months for the diabetes and cholesterol- don't eat before this appt 2)  We'll notify you of your lab results 3)  Keep up the good work 4)  Call with any questions or concerns 5)  Happy Holidays!   Orders Added: 1)  Pneumococcal Vaccine [90732] 2)  Admin 1st Vaccine [90471] 3)   Venipuncture [36415] 4)  TLB-A1C / Hgb A1C (Glycohemoglobin) [83036-A1C] 5)  Specimen Handling [99000] 6)  Venipuncture [36415] 7)  TLB-A1C / Hgb A1C (Glycohemoglobin) [83036-A1C] 8)  Est. Patient Level III [42683]   Immunizations Administered:  Pneumonia Vaccine:    Vaccine Type: Pneumovax    Site: right deltoid    Mfr: Merck    Dose: 0.5 ml    Route: IM    Given by: Doristine Devoid CMA    Exp. Date: 04/30/2012    Lot #: [id number]   Immunizations Administered:  Pneumonia Vaccine:    Vaccine Type: Pneumovax    Site: right deltoid    Mfr: Merck    Dose: 0.5 ml    Route: IM    Given by: Doristine Devoid CMA    Exp. Date: 04/30/2012    Lot #: [id number]

## 2011-01-29 NOTE — Assessment & Plan Note (Signed)
Summary: bp check/kdc   Vital Signs:  Patient profile:   65 year old female Menstrual status:  postmenopausal Weight:      155.38 pounds BMI:     26.35 O2 Sat:      99 % on Room air Temp:     97.8 degrees F p Pulse rate:   78 / minute Pulse rhythm:   regular Resp:     16 per minute BP sitting:   120 / 64  (right arm) Cuff size:   regular  Vitals Entered By: Mervin Kung CMA (March 14, 2010 8:07 AM)  O2 Flow:  Room air CC: room 17 Nurse visit for blood Pressure check Is Patient Diabetic? Yes   CC:  room 17 Nurse visit for blood Pressure check.  Preventive Screening-Counseling & Management  Alcohol-Tobacco     Smoking Status: never  Allergies (verified): No Known Drug Allergies   Impression & Recommendations:  Problem # 1:  HYPERTENSION (ICD-401.9) Assessment Improved  Her updated medication list for this problem includes:    Norvasc 10 Mg Tabs (Amlodipine besylate) ..... One tablet by mouth daily    Lopressor 50 Mg Tabs (Metoprolol tartrate) .Marland Kitchen... Take 1 tablet by mouth once a day  Complete Medication List: 1)  Glucotrol Xl 5 Mg Tb24 (Glipizide) .... Take one tablet by mouth daily 2)  Zocor 80 Mg Tabs (Simvastatin) .... Take one tablet daily 3)  Prilosec 40 Mg Cpdr (Omeprazole) .... Take 2  tablets daily 4)  Fluticasone Propionate 50 Mcg/act Susp (Fluticasone propionate) .... Spray 2 sprays in each nostril daily 5)  Onetouch Ultra Test Strp (Glucose blood) .... Test twice daily 6)  Bayer Aspirin 325 Mg Tabs (Aspirin) .Marland Kitchen.. 1 by mouth once daily 7)  Vitamin D 1000 Unit Caps (cholecalciferol)  .... Once a day by mouth 8)  Daily Vitamins For Women Tabs (Multiple vitamins-calcium) .Marland Kitchen.. 1 per day po 9)  Norvasc 10 Mg Tabs (Amlodipine besylate) .... One tablet by mouth daily 10)  Lopressor 50 Mg Tabs (Metoprolol tartrate) .... Take 1 tablet by mouth once a day Prescriptions: LOPRESSOR 50 MG TABS (METOPROLOL TARTRATE) Take 1 tablet by mouth once a day  #60 x 2  Entered and Authorized by:   Lemont Fillers FNP   Signed by:   Lemont Fillers FNP on 03/14/2010   Method used:   Electronically to        [facility] [phone]* (retail)       [address]       [place], [place]  [address]       Ph: [phone]       Fax: [phone]   RxID:   [id number]    Current Allergies (reviewed today): No known allergies

## 2011-01-29 NOTE — Consult Note (Signed)
Summary: [facility]  [facility]   Imported By: Lanelle Bal 04/18/2010 12:46:34  _____________________________________________________________________  External Attachment:    Type:   Image     Comment:   External Document

## 2011-01-29 NOTE — Progress Notes (Signed)
Summary: glipizide refill   Phone Note Refill Request Message from:  Fax from Pharmacy on [address] fax [phone]  glipizide xl 5mg   Initial call taken by: Barb Merino,  January 25, 2010 8:24 AM    Prescriptions: GLUCOTROL XL 5 MG  TB24 (GLIPIZIDE) Take one tablet by mouth daily  #30 Tablet x 3   Entered by:   Doristine Devoid   Authorized by:   Neena Rhymes MD   Signed by:   Doristine Devoid on 01/26/2010   Method used:   Electronically to        [facility] [phone]* (retail)       [address]       [place], [place]  [address]       Ph: [phone]       Fax: [phone]   RxID:   [id number]

## 2011-01-29 NOTE — Assessment & Plan Note (Signed)
Summary: left foot pain//lch   Vital Signs:  Patient profile:   65 year old female Menstrual status:  postmenopausal Weight:      150.06 pounds BP sitting:   134 / 80  (left arm)  Vitals Entered By: Doristine Devoid (April 19, 2010 1:36 PM) CC: L foot painful x4 days no w/ swellen that just started    History of Present Illness: 65 yo woman here today w/ L foot pain.  woke up Monday w/ cramp in leg and when she stood up had foot pain.  swelling developed yesterday.  no injury to foot.  took 2 ibuprofen w/ unknown results- hurting less today but pt not sure if it was the meds.  reports pain increases after periods of rest and then getting up again.  pt thinks the pain is on the top of the foot but isn't sure.  Current Medications (verified): 1)  Glucotrol Xl 5 Mg  Tb24 (Glipizide) .... Take One Tablet By Mouth Daily 2)  Zocor 80 Mg  Tabs (Simvastatin) .... Take One Tablet Daily 3)  Prilosec 40 Mg  Cpdr (Omeprazole) .... Take 2  Tablets Daily 4)  Fluticasone Propionate 50 Mcg/act  Susp (Fluticasone Propionate) .... Spray 2 Sprays in Each Nostril Daily 5)  Onetouch Ultra Test  Strp (Glucose Blood) .... Test Twice Daily 6)  Bayer Aspirin 325 Mg  Tabs (Aspirin) .Marland Kitchen.. 1 By Mouth Once Daily 7)  Vitamin D 1000 Unit Caps (Cholecalciferol) .... Once A Day By Mouth 8)  Daily Vitamins For Women  Tabs (Multiple Vitamins-Calcium) .Marland Kitchen.. 1 Per Day Po 9)  Norvasc 10 Mg Tabs (Amlodipine Besylate) .... One Tablet By Mouth Daily 10)  Lopressor 50 Mg Tabs (Metoprolol Tartrate) .... Take 1 Tablet By Mouth Once A Day 11)  Naproxen 500 Mg Tabs (Naproxen) .Marland Kitchen.. 1 Tab By Mouth Two Times A Day X7 Days and Then As Needed.  Take W/ Food.  Allergies (verified): No Known Drug Allergies  Review of Systems      See HPI  Physical Exam  General:  Well-developed,well-nourished,in no acute distress; alert,appropriate and cooperative throughout examination Msk:  +1 edema of L foot w/ TTP at base of 4th metatarsal.  no  TTP along plantar fascia, either malleolus, or calcaneus Pulses:  +2 PT/PT   Impression & Recommendations:  Problem # 1:  FOOT, PAIN (ICD-719.47) Assessment New pt w/ new onset pain and swelling.  no hx of injury.  pt very active in caring for grandchildren- need to r/o stress fx.  check xrays.  start ice, elevation, scheduled NSAID.  reviewed red flags that should prompt return.  Pt expresses understanding and is in agreement w/ this plan. Orders: T-Foot Left Min 3 Views (73630TC)  Complete Medication List: 1)  Glucotrol Xl 5 Mg Tb24 (Glipizide) .... Take one tablet by mouth daily 2)  Zocor 80 Mg Tabs (Simvastatin) .... Take one tablet daily 3)  Prilosec 40 Mg Cpdr (Omeprazole) .... Take 2  tablets daily 4)  Fluticasone Propionate 50 Mcg/act Susp (Fluticasone propionate) .... Spray 2 sprays in each nostril daily 5)  Onetouch Ultra Test Strp (Glucose blood) .... Test twice daily 6)  Bayer Aspirin 325 Mg Tabs (Aspirin) .Marland Kitchen.. 1 by mouth once daily 7)  Vitamin D 1000 Unit Caps (cholecalciferol)  .... Once a day by mouth 8)  Daily Vitamins For Women Tabs (Multiple vitamins-calcium) .Marland Kitchen.. 1 per day po 9)  Norvasc 10 Mg Tabs (Amlodipine besylate) .... One tablet by mouth daily 10)  Lopressor 50  Mg Tabs (Metoprolol tartrate) .... Take 1 tablet by mouth once a day 11)  Naproxen 500 Mg Tabs (Naproxen) .Marland Kitchen.. 1 tab by mouth two times a day x7 days and then as needed.  take w/ food.  Patient Instructions: 1)  Go to [address] for the xray- we'll call you with those results 2)  Start the Naproxen tonight w/ dinner 3)  Ice for 20 minutes at a time 4)  Elevate as much as possible 5)  Rest if you're able 6)  If no improvement in the next week- call me! 7)  Happy Easter!!! Prescriptions: NAPROXEN 500 MG TABS (NAPROXEN) 1 tab by mouth two times a day x7 days and then as needed.  take w/ food.  #60 x 0   Entered and Authorized by:   Neena Rhymes MD   Signed by:   Neena Rhymes MD on 04/19/2010    Method used:   Electronically to        [facility] [phone]* (retail)       [address]       [place], [place]  [address]       Ph: [phone]       Fax: [phone]   RxID:   [id number]    Orders Added: 1)  T-Foot Left Min 3 Views [73630TC] 2)  Est. Patient Level III [24401]

## 2011-01-29 NOTE — Assessment & Plan Note (Signed)
Summary: cpx & lab/cbs  Flu Vaccine Consent Questions     Do you have a history of severe allergic reactions to this vaccine? no    Any prior history of allergic reactions to egg and/or gelatin? no    Do you have a sensitivity to the preservative Thimersol? no    Do you have a past history of Guillan-Barre Syndrome? no    Do you currently have an acute febrile illness? no    Have you ever had a severe reaction to latex? no    Vaccine information given and explained to patient? yes    Are you currently pregnant? no    Lot Number:[id number]   Exp Date:06/29/2011   Site Given  Left Deltoid IM    Vital Signs:  Patient profile:   65 year old female Menstrual status:  postmenopausal Height:      64.50 inches Weight:      157 pounds BMI:     26.63 Pulse rate:   61 / minute BP sitting:   136 / 67  (left arm)  Vitals Entered By: Doristine Devoid CMA (August 29, 2010 8:41 AM) CC: yearly and labs  Vision Screening:      Vision Comments: eye exam 09/2009   History of Present Illness: 65 yo woman here today for her welcome to medicare physical.  Here for Medicare AWV:  1.   Risk factors based on Past M, S, F history: DM- CBGs remain labile, tend to increase w/ stress.  no symptomatic lows.  UTD on eye exam.  no CP, SOB, HAs, visual changes, numbness/tingling HTN- well controlled on Lopressor and Lisinopril.  asymptomatic. Hyperlipidemia- on Simvastatin 80mg .  no myalgias, N/V, abd pain. 2.   Physical Activities: going to [employer] 3x/week, walking, chases grandchildren 3.   Depression/mood: mood will fluctuate, not on meds.  has a lot of family stress.  denies SI/HI 4.   Hearing: no concerns, normal to whispered voice 5.   ADL's: independent 6.   Fall Risk: not a fall risk 7.   Home Safety: no concerns, husband present, no difficulty w/ stairs 8.   Height, weight, &visual acuity: see vitals, vision corrected to 20/20 w/ glasses 9.   Counseling: reviewed diet and exercise, stress  relief 10.   Labs ordered based on risk factors: see A&P 11.           Referral Coordination: UTD on mammogram, colonoscopy, pap 12.           Care Plan: see A&P 13.           Cognitive Assessment- AAOx3, able to pay bills, balance checkbook, follow a recipe, 3/3 object recall, W-O-R-L-D  Preventive Screening-Counseling & Management  Alcohol-Tobacco     Alcohol drinks/day: <1     Smoking Status: never  Caffeine-Diet-Exercise     Does Patient Exercise: yes     Type of exercise: [employer]     Times/week: 5      Sexual History:  currently monogamous.        Drug Use:  never.    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 Take One Tablet By Mouth Daily 2)  Zocor 80 Mg  Tabs (Simvastatin) .... Take One Tablet Daily 3)  Prilosec 40 Mg  Cpdr (Omeprazole) .... Take 2  Tablets Daily 4)  Fluticasone Propionate 50 Mcg/act  Susp (Fluticasone Propionate) .... Spray 2 Sprays in Each Nostril Daily 5)  Onetouch Ultra Test  Strp (Glucose Blood) .... Test Twice Daily 6)  Bayer Aspirin 325 Mg  Tabs (Aspirin) .Marland Kitchen.. 1 By Mouth Once Daily 7)  Vitamin D 1000 Unit Caps (Cholecalciferol) .... Once A Day By Mouth 8)  Daily Vitamins For Women  Tabs (Multiple Vitamins-Calcium) .Marland Kitchen.. 1 Per Day Po 9)  Lisinopril 10 Mg Tabs (Lisinopril) .... Take One Tablet Daily 10)  Lopressor 50 Mg Tabs (Metoprolol  Tartrate) .... Take 1 Tablet By Mouth Once A Day  Allergies (verified): No Known Drug Allergies  Past History:  Past Medical History: Last updated: 07/11/2008 Diabetes mellitus, type II Hyperlipidemia Hypertension Current Problems:  SEBORRHEIC KERATOSIS (ICD-702.19) PREVENTIVE HEALTH CARE (ICD-V70.0) RHINITIS (ICD-477.9) HYPERTENSION (ICD-401.9) HYPERLIPIDEMIA (ICD-272.4) DIABETES MELLITUS, TYPE II (ICD-250.00) Scleraderma  Past Surgical History: Last updated: 10/20/2008 Cholecystectomy Tubal ligation L breast lumpectomy  Family History: Last updated: 03/08/2008 Mother: CVA, MI,  breast cancer Father: Lung cancer: Siblings: 3 brother: arrythmia  Social History: Reviewed history from 10/20/2008 and no changes required. Retired from school system Married Never Smoked Alcohol use-yes Drug use-no Regular exercise-yes daughter gave birth to triplets in summer 09- 1 boy, identical girls, 1 girl died  Review of Systems  The patient denies anorexia, fever, weight loss, weight gain, vision loss, decreased hearing, hoarseness, chest pain, syncope, dyspnea on exertion, peripheral edema, prolonged cough, headaches, abdominal pain, melena, hematochezia, severe indigestion/heartburn, hematuria, suspicious skin lesions, depression, abnormal bleeding, enlarged lymph nodes, and breast masses.    Physical Exam  General:  Well-developed,well-nourished,in no acute distress; alert,appropriate and cooperative throughout examination Head:  Normocephalic and atraumatic without obvious abnormalities. No apparent alopecia or balding. Eyes:  PERRLA without injection, fundoscopic exam deferred to ophtho Ears:  External ear exam shows no significant lesions or deformities.  Otoscopic examination reveals clear canals, tympanic membranes are intact bilaterally without bulging, retraction, inflammation or discharge. Hearing is grossly normal bilaterally. Nose:  External nasal examination shows  no deformity or inflammation. Nasal mucosa are pink and moist without lesions or exudates. Mouth:  Oral mucosa and oropharynx without lesions or exudates.  Teeth in good repair. Neck:  No deformities, masses, or tenderness noted. Breasts:  No mass, nodules, thickening, tenderness, bulging, retraction, inflamation, nipple discharge or skin changes noted.   Lungs:  Normal respiratory effort, chest expands symmetrically. Lungs are clear to auscultation, no crackles or wheezes. Heart:  Normal rate and regular rhythm. S1 and S2 normal without gallop, murmur, click, rub or other extra sounds. Abdomen:  Bowel sounds positive,abdomen soft and non-tender without masses Genitalia:  deferred Pulses:  +2 carotid, radial, DP Extremities:  No clubbing, cyanosis, edema, or deformity noted with normal full range of motion of all joints.   Neurologic:  No cranial nerve deficits noted. Station and gait are normal. Plantar reflexes are down-going bilaterally. DTRs are symmetrical throughout. Sensory, motor and coordinative functions appear intact. Skin:  Intact without suspicious lesions or rashes Cervical Nodes:  No lymphadenopathy noted Axillary Nodes:  No  palpable lymphadenopathy Psych:  Cognition and judgment appear intact. Alert and cooperative with normal attention span and concentration. No apparent delusions, illusions, hallucinations   Impression & Recommendations:  Problem # 1:  PREVENTIVE HEALTH CARE (ICD-V70.0) Assessment Unchanged pt's PE WNL.  UTD on health maintainence.  will get pneumovax next visit b/c she will get flu today.  check labs.  anticipatory guidance provided. Orders: Specimen Handling (44010) TLB-CBC Platelet - w/Differential (85025-CBCD) TLB-TSH (Thyroid Stimulating Hormone) (84443-TSH) Welcome to Medicare, Physical (U7253)  Problem # 2:  DIABETES MELLITUS, TYPE II (ICD-250.00) Assessment: Unchanged CBGs within range according to pt.  asymptomatic.  check labs and adjust meds  as needed. Her updated medication list for this problem includes:    Glucotrol Xl 5 Mg Tb24 (Glipizide) .Marland Kitchen... Take one tablet by mouth daily    Bayer Aspirin 325 Mg Tabs (Aspirin) .Marland Kitchen... 1 by mouth once daily    Lisinopril 10 Mg Tabs (Lisinopril) .Marland Kitchen... Take one tablet daily  Orders: Venipuncture (66440) Specimen Handling (34742) TLB-A1C / Hgb A1C (Glycohemoglobin) (83036-A1C) TLB-BMP (Basic Metabolic Panel-BMET) (80048-METABOL)  Problem # 3:  HYPERLIPIDEMIA (ICD-272.4) Assessment: Unchanged due for labs.  adjust meds as needed. Her updated medication list for this problem includes:    Zocor 80 Mg Tabs (Simvastatin) .Marland Kitchen... Take one tablet daily  Orders: Specimen Handling (59563) TLB-Lipid Panel (80061-LIPID) TLB-Hepatic/Liver Function Pnl (80076-HEPATIC)  Problem # 4:  HYPERTENSION (ICD-401.9) Assessment: Unchanged well controlled.  asymptomatic.  no changes at this time. Her updated medication list for this problem includes:    Lisinopril 10 Mg Tabs (Lisinopril) .Marland Kitchen... Take one tablet daily    Lopressor 50 Mg Tabs (Metoprolol tartrate) .Marland Kitchen... Take 1 tablet by mouth once a day  Orders: EKG w/ Interpretation (93000)  Problem # 5:  RHINITIS (ICD-477.9) Assessment: Unchanged  refill provided Her updated medication list for this problem includes:    Fluticasone Propionate 50 Mcg/act Susp (Fluticasone propionate) ..... Spray 2 sprays in each nostril daily  Orders: Prescription Created Electronically [phone]  Complete Medication List: 1)  Glucotrol Xl 5 Mg Tb24 (Glipizide) .... Take one tablet by mouth daily 2)  Zocor 80 Mg Tabs (Simvastatin) .... Take one tablet daily 3)  Prilosec 40 Mg Cpdr (Omeprazole) .... Take 2  tablets daily 4)  Fluticasone Propionate 50 Mcg/act Susp (Fluticasone propionate) .... Spray 2 sprays in each nostril daily 5)  Onetouch Ultra Test Strp (Glucose blood) .... Test twice daily 6)  Bayer Aspirin 325 Mg Tabs (Aspirin) .Marland Kitchen.. 1 by mouth once daily 7)   Vitamin D 1000 Unit Caps (cholecalciferol)  .... Once a day by mouth 8)  Daily Vitamins For Women Tabs (Multiple vitamins-calcium) .Marland Kitchen.. 1 per day po 9)  Lisinopril 10 Mg Tabs (Lisinopril) .... Take one tablet daily 10)  Lopressor 50 Mg Tabs (Metoprolol tartrate) .... Take 1 tablet by mouth once a day  Other Orders: Flu Vaccine 65yrs + (33295) Administration Flu vaccine - MCR (J8841)  Patient Instructions: 1)  Follow up in 3 months to recheck diabetes- we'll do the Pneumovax at that time 2)  We'll notify you of your lab results 3)  Keep up the good work on diet and exercise 4)  Call with any questions or concerns 5)  You look great! 6)  Happy Labor Day! Prescriptions: FLUTICASONE PROPIONATE 50 MCG/ACT  SUSP (FLUTICASONE PROPIONATE) Spray 2 sprays in each nostril daily  #1 x 3   Entered by:   Doristine Devoid CMA   Authorized by:   Neena Rhymes MD  Signed by:   Doristine Devoid CMA on 08/29/2010   Method used:   Electronically to        [facility] [phone]* (retail)       [address]       [place], [place]  [address]       Ph: [phone]       Fax: [phone]   RxID:   [id number] LOPRESSOR 50 MG TABS (METOPROLOL TARTRATE) Take 1 tablet by mouth once a day  #180 x 1   Entered by:   Doristine Devoid CMA   Authorized by:   Neena Rhymes MD   Signed by:   Doristine Devoid CMA on 08/29/2010   Method used:   Electronically to        [facility] [phone]* (retail)       [address]       [place], [place]  [address]       Ph: [phone]       Fax: [phone]   RxID:   [id number] LISINOPRIL 10 MG TABS (LISINOPRIL) take one tablet daily  #90 x 1   Entered by:   Doristine Devoid CMA   Authorized by:   Neena Rhymes MD   Signed by:   Doristine Devoid CMA on 08/29/2010   Method used:   Electronically to        [facility] [phone]* (retail)       [address]       [place], [place]   [address]       Ph: [phone]       Fax: [phone]   RxID:   [id number] ONETOUCH ULTRA TEST  STRP (GLUCOSE BLOOD) test twice daily  #100 x 3   Entered by:   Doristine Devoid CMA   Authorized by:   Neena Rhymes MD   Signed by:   Doristine Devoid CMA on 08/29/2010   Method used:   Electronically to        [facility] [phone]* (retail)       [address]       [place], [place]  [address]       Ph: [phone]       Fax: [phone]   RxID:   [id number] GLUCOTROL XL 5 MG  TB24 (GLIPIZIDE) Take one tablet by mouth daily  #30 Tablet x 3   Entered by:   Doristine Devoid CMA   Authorized by:   Neena Rhymes MD   Signed by:   Doristine Devoid CMA on 08/29/2010   Method used:   Electronically to        [facility] [phone]* (retail)       [address]       [place], [place]  [address]       Ph: [phone]       Fax: [phone]   RxID:   [id number] ZOCOR 80 MG  TABS (SIMVASTATIN) Take one tablet daily  #90 x 1   Entered by:   Doristine Devoid CMA   Authorized by:   Neena Rhymes MD   Signed by:   Doristine Devoid CMA on 08/29/2010   Method used:   Electronically to        [facility]  [facility] [phone]* (retail)       [address]       [place], [place]  [address]       Ph: [phone]       Fax: [phone]   RxID:   [id number]

## 2011-01-29 NOTE — Letter (Signed)
   [facility] [address] [place], [place] [address] [phone]    February 14, 2010   Andrea Heath [address] [place], [place] [address]  RE:  LAB RESULTS  Dear  Ms. Dupre,  The following is an interpretation of your most recent lab tests.  Please take note of any instructions provided or changes to medications that have resulted from your lab work.  ELECTROLYTES:  Good - no changes needed  KIDNEY FUNCTION TESTS:  Good - no changes needed  LIVER FUNCTION TESTS:  Good - no changes needed  LIPID PANEL:  Stable - no changes needed Triglyceride: 179.0   Cholesterol: 157   LDL: 76   HDL: 45.00   Chol/HDL%:  3   DIABETIC STUDIES:  Good - no changes needed Blood Glucose: 126   HgbA1C: 6.0   Microalbumin/Creatinine Ratio: 10.3     CBC:  Good - no changes needed   Sincerely Yours,    Lemont Fillers FNP

## 2011-01-31 NOTE — Progress Notes (Signed)
Summary: Refill Request  Phone Note Refill Request Call back at [phone] Message from:  Pharmacy on January 22, 2011 8:54 AM  Refills Requested: Medication #1:  GLUCOTROL XL 5 MG  TB24 Take one tablet by mouth daily   Dosage confirmed as above?Dosage Confirmed   Brand Name Necessary? No   Supply Requested: 30   Last Refilled: 12/26/2010 [facility] [address]  Next Appointment Scheduled: 3.6.12 Initial call taken by: Harold Barban,  January 22, 2011 8:54 AM    Prescriptions: GLUCOTROL XL 5 MG  TB24 (GLIPIZIDE) Take one tablet by mouth daily  #30 Tablet x 6   Entered by:   Doristine Devoid CMA   Authorized by:   Neena Rhymes MD   Signed by:   Doristine Devoid CMA on 01/22/2011   Method used:   Electronically to        [facility] [phone]* (retail)       [address]       [place], [place]  [address]       Ph: [phone]       Fax: [phone]   RxID:   [id number]

## 2011-02-19 ENCOUNTER — Telehealth (INDEPENDENT_AMBULATORY_CARE_PROVIDER_SITE_OTHER): Payer: Self-pay | Admitting: *Deleted

## 2011-02-22 ENCOUNTER — Encounter: Payer: Self-pay | Admitting: Family Medicine

## 2011-02-26 NOTE — Progress Notes (Signed)
Summary: refill  Phone Note Refill Request Message from:  Fax from Pharmacy on February 19, 2011 1:56 PM  Refills Requested: Medication #1:  ZOCOR 80 MG  TABS Take one tablet daily  Medication #2:  LISINOPRIL 10 MG TABS take one tablet daily [facility] [address] - fax [phone]  Initial call taken by: Okey Regal [place],  February 19, 2011 1:57 PM    Prescriptions: LISINOPRIL 10 MG TABS (LISINOPRIL) take one tablet daily  #90 x 1   Entered by:   Doristine Devoid CMA   Authorized by:   Neena Rhymes MD   Signed by:   Doristine Devoid CMA on 02/19/2011   Method used:   Electronically to        [facility] [phone]* (retail)       [address]       [place], [place]  [address]       Ph: [phone]       Fax: [phone]   RxID:   [id number] ZOCOR 80 MG  TABS (SIMVASTATIN) Take one tablet daily  #90 x 1   Entered by:   Doristine Devoid CMA   Authorized by:   Neena Rhymes MD   Signed by:   Doristine Devoid CMA on 02/19/2011   Method used:   Electronically to        [facility] [phone]* (retail)       [address]       [place], [place]  [address]       Ph: [phone]       Fax: [phone]   RxID:   [id number]

## 2011-03-05 ENCOUNTER — Other Ambulatory Visit: Payer: Self-pay | Admitting: Family Medicine

## 2011-03-05 ENCOUNTER — Ambulatory Visit (INDEPENDENT_AMBULATORY_CARE_PROVIDER_SITE_OTHER): Payer: Medicare Other | Admitting: Family Medicine

## 2011-03-05 ENCOUNTER — Encounter: Payer: Self-pay | Admitting: Family Medicine

## 2011-03-05 DIAGNOSIS — I10 Essential (primary) hypertension: Secondary | ICD-10-CM

## 2011-03-05 DIAGNOSIS — E785 Hyperlipidemia, unspecified: Secondary | ICD-10-CM

## 2011-03-05 DIAGNOSIS — E119 Type 2 diabetes mellitus without complications: Secondary | ICD-10-CM

## 2011-03-05 LAB — LIPID PANEL
Cholesterol: 133 mg/dL (ref 0–200)
HDL: 36.3 mg/dL — ABNORMAL LOW
LDL Cholesterol: 69 mg/dL (ref 0–99)
Total CHOL/HDL Ratio: 4
Triglycerides: 140 mg/dL (ref 0.0–149.0)
VLDL: 28 mg/dL (ref 0.0–40.0)

## 2011-03-05 LAB — HEPATIC FUNCTION PANEL
ALT: 22 U/L (ref 0–35)
AST: 26 U/L (ref 0–37)
Alkaline Phosphatase: 64 U/L (ref 39–117)
Bilirubin, Direct: 0.1 mg/dL (ref 0.0–0.3)
Total Protein: 6.8 g/dL (ref 6.0–8.3)

## 2011-03-05 LAB — BASIC METABOLIC PANEL WITH GFR
BUN: 11 mg/dL (ref 6–23)
CO2: 27 meq/L (ref 19–32)
Calcium: 9.1 mg/dL (ref 8.4–10.5)
Chloride: 107 meq/L (ref 96–112)
Creatinine, Ser: 1 mg/dL (ref 0.4–1.2)
GFR: 60.35 mL/min
Glucose, Bld: 135 mg/dL — ABNORMAL HIGH (ref 70–99)
Potassium: 4.9 meq/L (ref 3.5–5.1)
Sodium: 141 meq/L (ref 135–145)

## 2011-03-12 NOTE — Assessment & Plan Note (Signed)
Summary: rto 3 months --CBS   Vital Signs:  Patient profile:   65 year old female Menstrual status:  postmenopausal Height:      64.50 inches (163.83 cm) Weight:      159.50 pounds (72.50 kg) BMI:     27.05 Temp:     98.4 degrees F (36.89 degrees C) oral BP sitting:   112 / 62  (left arm) Cuff size:   regular  Vitals Entered By: Lucious Groves CMA (March 05, 2011 9:19 AM) CC: 3 mo follow up./kb Is Patient Diabetic? Yes Pain Assessment Patient in pain? no        History of Present Illness: 65 yo woman here today for   1) DM- CBGs remain labile.  no symptomatic lows.  exercising regularly at [employer].  watching diet.  2) Hyperlipidemia- on Simvastatin 80mg .  no abd pain, N/V, myalgias.  3) HTN- excellent today.  on Lopressor and Lisinopril.  no CP, SOB, HAs, visual changes, edema.  Current Medications (verified): 1)  Glucotrol Xl 5 Mg  Tb24 (Glipizide) .... Take One Tablet By Mouth Daily 2)  Zocor 80 Mg  Tabs (Simvastatin) .... Take One Tablet Daily 3)  Prilosec 40 Mg  Cpdr (Omeprazole) .... Take 2  Tablets Daily 4)  Fluticasone Propionate 50 Mcg/act  Susp (Fluticasone Propionate) .... Spray 2 Sprays in Each Nostril Daily 5)  Onetouch Ultra Test  Strp (Glucose Blood) .... Test Twice Daily 6)  Bayer Aspirin 325 Mg  Tabs (Aspirin) .Marland Kitchen.. 1 By Mouth Once Daily 7)  Vitamin D 1000 Unit Caps (Cholecalciferol) .... Once A Day By Mouth 8)  Daily Vitamins For Women  Tabs (Multiple Vitamins-Calcium) .Marland Kitchen.. 1 Per Day Po 9)  Lisinopril 10 Mg Tabs (Lisinopril) .... Take One Tablet Daily 10)  Lopressor 50 Mg Tabs (Metoprolol Tartrate) .... Take 1 Tablet By Mouth Once A Day 11)  Tessalon 200 Mg Caps (Benzonatate) .... Take One Capsule By Mouth Three Times A Day As Needed For Cough  Allergies (verified): No Known Drug Allergies  Past History:  Past medical, surgical, family and social histories (including risk factors) reviewed, and no changes noted (except as noted below).  Past Medical  History: Reviewed history from 07/11/2008 and no changes required. Diabetes mellitus, type II Hyperlipidemia Hypertension Current Problems:  SEBORRHEIC KERATOSIS (ICD-702.19) PREVENTIVE HEALTH CARE (ICD-V70.0) RHINITIS (ICD-477.9) HYPERTENSION (ICD-401.9) HYPERLIPIDEMIA (ICD-272.4) DIABETES MELLITUS, TYPE II (ICD-250.00) Scleraderma  Past Surgical History: Reviewed history from 10/20/2008 and no changes required. Cholecystectomy Tubal ligation L breast lumpectomy  Family History: Reviewed history from 03/08/2008 and no changes required. Mother: CVA, MI,  breast cancer Father: Lung cancer: Siblings: 3 brother: arrythmia  Social History: Reviewed history from 08/29/2010 and no changes required. 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 No apparent alopecia or balding. Neck:  No deformities, masses, or tenderness noted. Lungs:  Normal respiratory effort, chest expands symmetrically. Lungs are clear to auscultation, no crackles or wheezes. Heart:  Normal rate and regular rhythm. S1 and S2 normal without gallop, murmur, click, rub or other extra sounds. Abdomen:  Bowel sounds positive,abdomen soft and non-tender without masses Pulses:  +2 carotid, radial, DP Extremities:  No clubbing, cyanosis, edema, or  deformity noted  Psych:  Cognition and judgment appear intact. Alert and cooperative with normal attention span and concentration. No apparent delusions, illusions, hallucinations   Impression & Recommendations:  Problem # 1:  DIABETES MELLITUS, TYPE II (ICD-250.00) Assessment Unchanged check A1C.   adjust meds as needed.  applauded her efforts on diet and exercise. Her updated medication list for this problem includes:    Glucotrol Xl 5 Mg Tb24 (Glipizide) .Marland Kitchen... Take one tablet by mouth daily    Bayer Aspirin 325 Mg Tabs (Aspirin) .Marland Kitchen... 1 by mouth once daily    Lisinopril 10 Mg Tabs (Lisinopril) .Marland Kitchen... Take one tablet daily  Orders: Venipuncture (14782) TLB-A1C / Hgb A1C (Glycohemoglobin) (83036-A1C) Specimen Handling (95621)  Problem # 2:  HYPERLIPIDEMIA (ICD-272.4) Assessment: Unchanged due for labs.  adjust meds as needed. Her updated medication list for this problem includes:    Zocor 80 Mg Tabs (Simvastatin) .Marland Kitchen... Take one tablet daily  Orders: TLB-Lipid Panel (80061-LIPID) TLB-Hepatic/Liver Function Pnl (80076-HEPATIC) Specimen Handling (30865)  Problem # 3:  HYPERTENSION (ICD-401.9) Assessment: Unchanged excellently controlled.  reports HR is very low while exercising.  will decrease Lopressor to 1/2 tab daily. Her updated medication list for this problem includes:    Lisinopril 10 Mg Tabs (Lisinopril) .Marland Kitchen... Take one tablet daily    Lopressor 50 Mg Tabs (Metoprolol tartrate) .Marland Kitchen... Take 1 tablet by mouth once a day  Orders: TLB-BMP (Basic Metabolic Panel-BMET) (80048-METABOL) Specimen Handling (78469)  Complete Medication List: 1)  Glucotrol Xl 5 Mg Tb24 (Glipizide) .... Take one tablet by mouth daily 2)  Zocor 80 Mg Tabs (Simvastatin) .... Take one tablet daily 3)  Prilosec 40 Mg Cpdr (Omeprazole) .... Take 2  tablets daily 4)  Fluticasone Propionate 50 Mcg/act Susp (Fluticasone propionate) .... Spray 2 sprays in each nostril daily 5)  Onetouch Ultra Test Strp (Glucose blood) .... Test twice daily 6)  Bayer Aspirin 325 Mg Tabs (Aspirin) .Marland Kitchen.. 1 by mouth once daily 7)  Vitamin D 1000 Unit Caps (cholecalciferol)  .... Once a day by mouth 8)  Daily Vitamins For Women Tabs (Multiple vitamins-calcium) .Marland Kitchen.. 1 per day po 9)  Lisinopril 10 Mg Tabs (Lisinopril) .... Take  one tablet daily 10)  Lopressor 50 Mg Tabs (Metoprolol tartrate) .... Take 1 tablet by mouth once a day 11)  Tessalon 200 Mg Caps (Benzonatate) .... Take one capsule by mouth three times a day as needed for cough  Patient Instructions: 1)  Follow up in 3 months to recheck diabetes 2)  We'll notify you of your lab results 3)  Keep up the good work- you look great!!! 4)  Decrease the Metoprolol to 25mg  (1/2 tab) 5)  Call with any questions or concerns 6)  Have a great trip!!!!   Orders Added: 1)  Venipuncture [36415] 2)  TLB-A1C / Hgb A1C (Glycohemoglobin) [83036-A1C] 3)  TLB-Lipid Panel [80061-LIPID] 4)  TLB-Hepatic/Liver Function Pnl [80076-HEPATIC] 5)  TLB-BMP (Basic Metabolic Panel-BMET) [80048-METABOL] 6)  Specimen Handling [99000] 7)  Est. Patient Level IV [62952]

## 2011-05-17 NOTE — Assessment & Plan Note (Signed)
[facility]                        [facility] OFFICE NOTE   NAME:Hunnell, ORPAH HAUSNER                     MRN:          [id number]  DATE:12/17/2006                            DOB:          December 11, 1946    Ms. Matteo is a 65 year old female whose visit was initially for a  complete physical examination.  It was later changed to an office visit  due to complaint of numbness and tingling of the face, hands, and upper  extremity.  This was associated with weakness.  She denies any loss of  consciousness but was equivocal on confusion.  Symptoms occurred while  she was a passenger in a car Saturday morning.  The episode lasted for  about an hour to an hour and a half.  She denied any shortness of  breath, dyspnea on exertion, chest pain, diaphoresis, or anxiety.  She  did report one episode of loose stool 30 minutes after the onset of the  above symptoms.  She had a similar episode less than a month ago.  This  most recent episode, she did check her blood sugar and her serum glucose  was 100 to 110, and her blood pressure was 120/70.  She has not had any  recurrence since.  She cannot recall any precipitating factor.   MEDICATIONS:  1. Aciphex 20 mg.  2. Aspirin 325.  3. Glucotrol XL 5 mg.  4. Lisinopril 40 mg.  5. Zocor 40 mg.   ALLERGIES:  NO KNOWN DRUG ALLERGIES.   OBJECTIVE:  VITAL SIGNS:  Blood pressure 132/90, pulse is 76,  respiratory rate of 12, weight is 181.4.  GENERAL:  We have a pleasant female in no acute distress, completely  asymptomatic today.  HEENT:  Unremarkable.  NECK:  Supple.  No lymphadenopathy, carotid bruits, or JVD.  No  thyromegaly.  LUNGS:  Clear.  HEART:  Regular rate and rhythm.  Normal S1 S2.  No murmurs, gallops, or  rubs.  NEUROLOGIC:  Cranial nerves II-XII grossly intact.  No focal sensory  motor deficits were noted.  Deep tendon reflexes were 2+ and equal  bilaterally.  No pronator drift was appreciated.   EKG shows sinus brady, no ST elevation or depression, no Q waves, no  PVCs, no PACs.   IMPRESSION:  A 65 year old female presenting with 2 episodes of sudden  onset of upper extremity and facial paresthesia associated with  weakness.   PLAN:  1. We will obtain a CT with and without contrast to rule out any      obvious pathology.  2. We will obtain TSH, B12.  Previously she did have a CBC and a      comprehensive metabolic profile which was unremarkable on November 18, 2006, after her first episode.  3. Since the patient is fasting, I will get a routine blood work (i.e.      lipids, hemoglobin A1c.)  4. I advised the patient if she has a recurrent episode, she is to be      seen in the emergency      department immediately.  5.  Further recommendations after review of the above.     Leanne Chang, M.D.  Electronically Signed    LA/MedQ  DD: 12/19/2006  DT: 12/20/2006  Job #: [id number]

## 2011-05-21 ENCOUNTER — Telehealth: Payer: Self-pay | Admitting: Family

## 2011-05-21 NOTE — Telephone Encounter (Signed)
Per 03/05/11 office note, pt was to decrease Metoprolol 50mg  in 1/2.  Spoke to pt and she verified that she has been taking 50mg  1/2 tablet daily. Pt states she will be out before her next appt. Please advise if ok to refill and how many refills?

## 2011-05-21 NOTE — Telephone Encounter (Signed)
Refill- metoprolol 50mg  tab teva. Take 1 tablet by mouth once a day. Qty 180. Last fill 4.16.11

## 2011-05-22 MED ORDER — METOPROLOL TARTRATE 50 MG PO TABS: ORAL_TABLET | ORAL | Status: DC

## 2011-05-22 NOTE — Telephone Encounter (Signed)
Ok for #90, 3 refills 

## 2011-06-06 ENCOUNTER — Encounter: Payer: Self-pay | Admitting: Family Medicine

## 2011-06-06 ENCOUNTER — Encounter: Payer: Self-pay | Admitting: *Deleted

## 2011-06-06 ENCOUNTER — Ambulatory Visit (INDEPENDENT_AMBULATORY_CARE_PROVIDER_SITE_OTHER): Payer: Medicare Other | Admitting: Family Medicine

## 2011-06-06 DIAGNOSIS — M25579 Pain in unspecified ankle and joints of unspecified foot: Secondary | ICD-10-CM

## 2011-06-06 DIAGNOSIS — E119 Type 2 diabetes mellitus without complications: Secondary | ICD-10-CM

## 2011-06-06 DIAGNOSIS — I10 Essential (primary) hypertension: Secondary | ICD-10-CM

## 2011-06-06 LAB — BASIC METABOLIC PANEL
Calcium: 8.8 mg/dL (ref 8.4–10.5)
GFR: 64.86 mL/min (ref >60.00)
Glucose, Bld: 153 mg/dL — ABNORMAL HIGH (ref 70–99)
Sodium: 143 mEq/L (ref 135–145)

## 2011-06-06 MED ORDER — METOPROLOL TARTRATE 25 MG PO TABS: ORAL_TABLET | ORAL | Status: DC

## 2011-06-06 NOTE — Patient Instructions (Signed)
Follow up in 3 months to check sugars and cholesterol We'll notify you of your lab results Call with any questions or concerns Good luck this summer!!

## 2011-06-06 NOTE — Assessment & Plan Note (Signed)
Toe obviously red and swollen, + TTP over PIP joint.  No deformity or misalignment.  Pt to buddy tape toes, ice, NSAIDs prn.  If no improvement pt to call.

## 2011-06-06 NOTE — Assessment & Plan Note (Signed)
Pt reports CBGs have been labile- will check A1C and adjust meds prn.  Pt expressed understanding and is in agreement w/ plan.

## 2011-06-06 NOTE — Assessment & Plan Note (Signed)
BP well controlled.  Asymptomatic.  No med changes.

## 2011-06-06 NOTE — Progress Notes (Signed)
  Subjective:    Patient ID: Andrea Heath, female    DOB: [birth year], 65 y.o.   MRN: [id number]  HPI DM- chronic problem for pt, reports sugars are labile due family stress.  On glipizide daily.  Exercising but not regularly.  No symptomatic lows.  HTN- chronic problem for pt, on metoprolol/lisinopril.  Wants to switch to 25 mg tabs to avoid cutting pills.  No CP, SOB, visual changes, HAs, edema.  Foot pain- woke up this AM w/ sore L 3rd toe.  Red and swollen.  Was playing w/ grandkids yesterday.  Doesn't remembering injury but 'i certainly could have'.   Review of Systems For ROS see HPI     Objective:   Physical Exam  Constitutional: She is oriented to person, place, and time. She appears well-developed and well-nourished. No distress.  HENT:  Head: Normocephalic and atraumatic.  Eyes: Conjunctivae and EOM are normal. Pupils are equal, round, and reactive to light.  Neck: Normal range of motion. Neck supple. No thyromegaly present.  Cardiovascular: Normal rate, regular rhythm, normal heart sounds and intact distal pulses.   No murmur heard. Pulmonary/Chest: Effort normal and breath sounds normal. No respiratory distress.  Abdominal: Soft. She exhibits no distension. There is no tenderness.  Musculoskeletal: She exhibits tenderness (L 3rd PIP joint on toe, + erythema and mild swelling). She exhibits no edema.  Lymphadenopathy:    She has no cervical adenopathy.  Neurological: She is alert and oriented to person, place, and time.  Skin: Skin is warm and dry.  Psychiatric: She has a normal mood and affect. Her behavior is normal.          Assessment & Plan:

## 2011-08-19 ENCOUNTER — Other Ambulatory Visit: Payer: Self-pay | Admitting: Family Medicine

## 2011-08-22 ENCOUNTER — Other Ambulatory Visit: Payer: Self-pay | Admitting: Family Medicine

## 2011-08-22 MED ORDER — METOPROLOL TARTRATE 25 MG PO TABS: ORAL_TABLET | ORAL | Status: DC

## 2011-09-10 ENCOUNTER — Ambulatory Visit: Payer: Medicare Other | Admitting: Family Medicine

## 2011-09-17 ENCOUNTER — Ambulatory Visit (INDEPENDENT_AMBULATORY_CARE_PROVIDER_SITE_OTHER): Payer: BC Managed Care – PPO | Admitting: Family Medicine

## 2011-09-17 DIAGNOSIS — E119 Type 2 diabetes mellitus without complications: Secondary | ICD-10-CM

## 2011-09-17 DIAGNOSIS — R252 Cramp and spasm: Secondary | ICD-10-CM

## 2011-09-17 DIAGNOSIS — I10 Essential (primary) hypertension: Secondary | ICD-10-CM

## 2011-09-17 LAB — BASIC METABOLIC PANEL
Calcium: 8.8 mg/dL (ref 8.4–10.5)
GFR: 63.22 mL/min (ref >60.00)
Sodium: 141 mEq/L (ref 135–145)

## 2011-09-17 LAB — HEMOGLOBIN A1C: Hgb A1c MFr Bld: 6.4 % (ref 4.6–6.5)

## 2011-09-24 NOTE — Progress Notes (Signed)
  Subjective:    Patient ID: Andrea Heath, female    DOB: 04-Jan-1946, 65 y.o.   MRN: [id number]  HPI DM- chronic problem, labile CBGs depending on stress.  On metformin, glipizide.  No CP, SOB, HAs, visual changes.  UTD on eye exam (last week).  No symptomatic lows.  HTN- chronic problem, excellent control.  On lisinopril.  Asymptomatic.  Leg cramps- intermittent.  Worse at night.  No new meds.  Reports good water intake.   Review of Systems For ROS see HPI     Objective:   Physical Exam  Vitals reviewed. Constitutional: She is oriented to person, place, and time. She appears well-developed and well-nourished. No distress.  HENT:  Head: Normocephalic and atraumatic.  Eyes: Conjunctivae and EOM are normal. Pupils are equal, round, and reactive to light.  Neck: Normal range of motion. Neck supple. No thyromegaly present.  Cardiovascular: Normal rate, regular rhythm, normal heart sounds and intact distal pulses.   No murmur heard. Pulmonary/Chest: Effort normal and breath sounds normal. No respiratory distress.  Abdominal: Soft. She exhibits no distension. There is no tenderness.  Musculoskeletal: She exhibits no edema.  Lymphadenopathy:    She has no cervical adenopathy.  Neurological: She is alert and oriented to person, place, and time.  Skin: Skin is warm and dry.  Psychiatric: She has a normal mood and affect. Her behavior is normal.          Assessment & Plan:

## 2011-09-24 NOTE — Assessment & Plan Note (Signed)
Stable, excellent control.  Asymptomatic.  No changes.

## 2011-09-24 NOTE — Assessment & Plan Note (Signed)
Intermittent.  Most likely due to low K+ or dehydration.  Increase water and K+ in diet.  Check BMP.

## 2011-09-24 NOTE — Assessment & Plan Note (Signed)
Stable.  Due for labs.  Adjust meds prn.  UTD on eye exam.

## 2011-10-10 ENCOUNTER — Ambulatory Visit (INDEPENDENT_AMBULATORY_CARE_PROVIDER_SITE_OTHER): Payer: Medicare Other

## 2011-10-10 DIAGNOSIS — Z23 Encounter for immunization: Secondary | ICD-10-CM

## 2011-11-13 ENCOUNTER — Other Ambulatory Visit: Payer: Self-pay | Admitting: Family Medicine

## 2011-11-13 NOTE — Telephone Encounter (Signed)
rx sent to pharmacy by e-script  

## 2011-11-19 ENCOUNTER — Other Ambulatory Visit: Payer: Self-pay | Admitting: Family Medicine

## 2011-11-19 ENCOUNTER — Encounter: Payer: Self-pay | Admitting: Family Medicine

## 2011-11-19 MED ORDER — LISINOPRIL 10 MG PO TABS: 10.0000 mg | ORAL_TABLET | Freq: Every day | ORAL | Status: DC

## 2011-11-19 NOTE — Telephone Encounter (Signed)
rx sent to pharmacy by e-script  

## 2011-12-03 ENCOUNTER — Encounter: Payer: Self-pay | Admitting: Family Medicine

## 2011-12-03 ENCOUNTER — Ambulatory Visit (INDEPENDENT_AMBULATORY_CARE_PROVIDER_SITE_OTHER): Payer: Medicare Other | Admitting: Family Medicine

## 2011-12-03 DIAGNOSIS — E119 Type 2 diabetes mellitus without complications: Secondary | ICD-10-CM

## 2011-12-03 DIAGNOSIS — E785 Hyperlipidemia, unspecified: Secondary | ICD-10-CM

## 2011-12-03 DIAGNOSIS — Z0000 Encounter for general adult medical examination without abnormal findings: Secondary | ICD-10-CM | POA: Insufficient documentation

## 2011-12-03 DIAGNOSIS — I10 Essential (primary) hypertension: Secondary | ICD-10-CM

## 2011-12-03 LAB — HEPATIC FUNCTION PANEL
ALT: 21 U/L (ref 0–35)
AST: 22 U/L (ref 0–37)
Bilirubin, Direct: 0.1 mg/dL (ref 0.0–0.3)
Total Protein: 7.4 g/dL (ref 6.0–8.3)

## 2011-12-03 LAB — BASIC METABOLIC PANEL
BUN: 11 mg/dL (ref 6–23)
CO2: 27 mEq/L (ref 19–32)
Chloride: 109 mEq/L (ref 96–112)
Creatinine, Ser: 0.9 mg/dL (ref 0.4–1.2)
Potassium: 4.5 mEq/L (ref 3.5–5.1)

## 2011-12-03 LAB — HEMOGLOBIN A1C: Hgb A1c MFr Bld: 6.1 % (ref 4.6–6.5)

## 2011-12-03 LAB — LIPID PANEL
LDL Cholesterol: 80 mg/dL (ref 0–99)
Total CHOL/HDL Ratio: 4
VLDL: 24 mg/dL (ref 0.0–40.0)

## 2011-12-03 LAB — CBC WITH DIFFERENTIAL/PLATELET
Basophils Absolute: 0 10*3/uL (ref 0.0–0.1)
Basophils Relative: 0.4 % (ref 0.0–3.0)
Hemoglobin: 13.5 g/dL (ref 12.0–15.0)
Lymphocytes Relative: 34.9 % (ref 12.0–46.0)
Monocytes Relative: 7 % (ref 3.0–12.0)
Neutro Abs: 2.9 10*3/uL (ref 1.4–7.7)
RBC: 4.37 Mil/uL (ref 3.87–5.11)

## 2011-12-03 NOTE — Assessment & Plan Note (Signed)
Pt's PE WNL.  UTD on health maintenance.  Check labs.  Anticipatory guidance provided.  

## 2011-12-03 NOTE — Assessment & Plan Note (Signed)
Chronic problem.  Tolerating meds w/out difficulty.  Asymptomatic.  Check labs.  Adjust meds prn

## 2011-12-03 NOTE — Progress Notes (Signed)
  Subjective:    Patient ID: Andrea Heath, female    DOB: Aug 11, 1946, 65 y.o.   MRN: [id number]  HPI Here today for CPE.  Risk Factors: DM- chronic problem, on Glipizide.  Sugars are labile day to day but A1Cs are typically well controlled.  No symptomatic lows.  UTD on eye exam. HTN- chronic problem, on Metoprolol, lisinopril.  Well controlled today.  Denies CP, SOB, HAs, visual changes. Hyperlipidemia- chronic problem, on Zocor.  Denies abd pain, N/V, myalgias. Physical Activity: very active Fall Risk: low risk Depression: chronic problem, well managed w/ church and friends.  Not on meds. Hearing: normal to conversational tones and whispered voice at 6 ft ADL's: independent Cognitive: normal linear thought process, memory and attention intact Home Safety: safe at home, lives w/ husband Height, Weight, BMI, Visual Acuity: see vitals, vision corrected to 20/20 w/ glasses Counseling: UTD on mammo, colonoscopy, due for pap next year. Labs Ordered: See A&P Care Plan: See A&P    Review of Systems Patient reports no vision/ hearing changes, adenopathy,fever, weight change,  persistant/recurrent hoarseness , swallowing issues, chest pain, palpitations, edema, persistant/recurrent cough, hemoptysis, dyspnea (rest/exertional/paroxysmal nocturnal), gastrointestinal bleeding (melena, rectal bleeding), abdominal pain, significant heartburn, bowel changes, GU symptoms (dysuria, hematuria, incontinence), Gyn symptoms (abnormal  bleeding, pain),  syncope, focal weakness, memory loss, numbness & tingling, skin/hair/nail changes, abnormal bruising or bleeding, anxiety, or depression.      Objective:   Physical Exam General Appearance:    Alert, cooperative, no distress, appears stated age  Head:    Normocephalic, without obvious abnormality, atraumatic  Eyes:    PERRL, conjunctiva/corneas clear, EOM's intact, fundi    benign, both eyes  Ears:    Normal TM's and external ear canals, both ears    Nose:   Nares normal, septum midline, mucosa normal, no drainage    or sinus tenderness  Throat:   Lips, mucosa, and tongue normal; teeth and gums normal  Neck:   Supple, symmetrical, trachea midline, no adenopathy;    Thyroid: no enlargement/tenderness/nodules  Back:     Symmetric, no curvature, ROM normal, no CVA tenderness  Lungs:     Clear to auscultation bilaterally, respirations unlabored  Chest Wall:    No tenderness or deformity   Heart:    Regular rate and rhythm, S1 and S2 normal, no murmur, rub   or gallop  Breast Exam:    Deferred due to recent mammo  Abdomen:     Soft, non-tender, bowel sounds active all four quadrants,    no masses, no organomegaly  Genitalia:    Deferred   Rectal:    Extremities:   Extremities normal, atraumatic, no cyanosis or edema  Pulses:   2+ and symmetric all extremities  Skin:   Skin color, texture, turgor normal, no rashes or lesions  Lymph nodes:   Cervical, supraclavicular, and axillary nodes normal  Neurologic:   CNII-XII intact, normal strength, sensation and reflexes    throughout          Assessment & Plan:

## 2011-12-03 NOTE — Assessment & Plan Note (Signed)
Chronic problem.  Well controlled.  Asymptomatic.  No changes. 

## 2011-12-03 NOTE — Assessment & Plan Note (Signed)
Chronic problem, typically well controlled.  UTD on eye exam.  Asymptomatic.  Check labs.  Adjust meds prn

## 2011-12-03 NOTE — Patient Instructions (Signed)
Follow up in 3 months to recheck diabetes We'll notify you of your lab results Keep up the good work!  You look great! Call with any questions or concerns Happy Holidays!!!

## 2011-12-04 ENCOUNTER — Encounter: Payer: Self-pay | Admitting: *Deleted

## 2011-12-04 LAB — TSH: TSH: 2.08 u[IU]/mL (ref 0.35–5.50)

## 2012-02-18 ENCOUNTER — Other Ambulatory Visit: Payer: Self-pay | Admitting: *Deleted

## 2012-02-18 MED ORDER — METOPROLOL TARTRATE 25 MG PO TABS: ORAL_TABLET | ORAL | Status: DC

## 2012-02-18 MED ORDER — SIMVASTATIN 80 MG PO TABS: 80.0000 mg | ORAL_TABLET | Freq: Every day | ORAL | Status: DC

## 2012-02-18 MED ORDER — GLIPIZIDE ER 5 MG PO TB24: 5.0000 mg | ORAL_TABLET | Freq: Every day | ORAL | Status: DC

## 2012-02-18 NOTE — Telephone Encounter (Signed)
Rx sent 

## 2012-03-03 ENCOUNTER — Encounter: Payer: Self-pay | Admitting: Family Medicine

## 2012-03-03 ENCOUNTER — Ambulatory Visit (INDEPENDENT_AMBULATORY_CARE_PROVIDER_SITE_OTHER): Payer: Medicare Other | Admitting: Family Medicine

## 2012-03-03 VITALS — BP 118/84 | HR 62 | Temp 98.2°F | Ht 65.5 in | Wt 156.8 lb

## 2012-03-03 DIAGNOSIS — E119 Type 2 diabetes mellitus without complications: Secondary | ICD-10-CM

## 2012-03-03 DIAGNOSIS — I10 Essential (primary) hypertension: Secondary | ICD-10-CM

## 2012-03-03 LAB — BASIC METABOLIC PANEL
CO2: 26 mEq/L (ref 19–32)
Calcium: 9 mg/dL (ref 8.4–10.5)
GFR: 66.38 mL/min (ref >60.00)
Sodium: 136 mEq/L (ref 135–145)

## 2012-03-03 LAB — HEMOGLOBIN A1C: Hgb A1c MFr Bld: 6.2 % (ref 4.6–6.5)

## 2012-03-03 NOTE — Assessment & Plan Note (Signed)
Chronic problem.  Well controlled.  Asymptomatic.  No changes. 

## 2012-03-03 NOTE — Progress Notes (Signed)
  Subjective:    Patient ID: Andrea Heath, female    DOB: 1946-01-21, 66 y.o.   MRN: [id number]  HPI DM- chronic problem.  On Glipizide daily.  Denies symptomatic lows.  No CP, SOB, HAs, visual changes, edema.  HTN- chronic problem.  Well controlled on lisinopril, metoprolol.  Asymptomatic.   Review of Systems For ROS see HPI     Objective:   Physical Exam  Vitals reviewed. Constitutional: She is oriented to person, place, and time. She appears well-developed and well-nourished. No distress.  HENT:  Head: Normocephalic and atraumatic.  Eyes: Conjunctivae and EOM are normal. Pupils are equal, round, and reactive to light.  Neck: Normal range of motion. Neck supple. No thyromegaly present.  Cardiovascular: Normal rate, regular rhythm, normal heart sounds and intact distal pulses.   No murmur heard. Pulmonary/Chest: Effort normal and breath sounds normal. No respiratory distress.  Abdominal: Soft. She exhibits no distension. There is no tenderness.  Musculoskeletal: She exhibits no edema.  Lymphadenopathy:    She has no cervical adenopathy.  Neurological: She is alert and oriented to person, place, and time.  Skin: Skin is warm and dry.  Psychiatric: She has a normal mood and affect. Her behavior is normal.          Assessment & Plan:

## 2012-03-03 NOTE — Assessment & Plan Note (Signed)
Chronic problem.  Typically well controlled.  Asymptomatic.  UTD on eye exam.  Check labs.  Adjust meds prn

## 2012-03-03 NOTE — Patient Instructions (Signed)
Follow up in 3 months to recheck DM and cholesterol You look great!  Keep up the good work! We'll notify you of your lab results Call with any questions or concerns Happy Birthday!!!

## 2012-03-04 ENCOUNTER — Encounter: Payer: Self-pay | Admitting: *Deleted

## 2012-03-19 ENCOUNTER — Other Ambulatory Visit: Payer: Self-pay | Admitting: *Deleted

## 2012-03-19 MED ORDER — GLUCOSE BLOOD VI STRP: ORAL_STRIP | Status: DC

## 2012-03-19 NOTE — Telephone Encounter (Signed)
Rx sent 

## 2012-03-23 ENCOUNTER — Other Ambulatory Visit: Payer: Self-pay | Admitting: *Deleted

## 2012-03-23 MED ORDER — GLUCOSE BLOOD VI STRP: ORAL_STRIP | Status: DC

## 2012-03-24 ENCOUNTER — Telehealth: Payer: Self-pay | Admitting: *Deleted

## 2012-03-24 MED ORDER — ONETOUCH ULTRASOFT LANCETS MISC: Status: AC

## 2012-03-24 MED ORDER — GLUCOSE BLOOD VI STRP: ORAL_STRIP | Status: DC

## 2012-03-24 NOTE — Telephone Encounter (Signed)
Spoke to Pharmacy Rx sent.

## 2012-03-24 NOTE — Telephone Encounter (Signed)
[facility] Triage Call Report Triage Record Num: [id number] Operator: Jeraldine Loots Patient Name: Andrea Heath Call Date & Time: 03/24/2012 2:52:31PM Patient Phone: [phone] PCP: Lezlie Octave Patient Gender: Female PCP Fax : [phone] Patient DOB: 1946/06/09 Practice Name: [facility] Reason for Call: Caller: Steven/; PCP: Sheliah Hatch.; CB#: [phone] calling about medication issue. Needs a dx code for pt. insurance. PLEASE CALL with this information. They are not approving the medication prescribed until they get the code. Thanks Protocol(s) Used: Office Note Recommended Outcome per Protocol: Information Noted and Sent to Office Reason for Outcome: Caller information to office Care Advice: ~ 03/24/2012 2:57:59PM

## 2012-04-30 ENCOUNTER — Encounter: Payer: Self-pay | Admitting: Family Medicine

## 2012-04-30 ENCOUNTER — Ambulatory Visit (INDEPENDENT_AMBULATORY_CARE_PROVIDER_SITE_OTHER): Payer: Medicare Other | Admitting: Family Medicine

## 2012-04-30 VITALS — BP 121/77 | HR 69 | Temp 98.2°F | Ht 65.0 in | Wt 160.8 lb

## 2012-04-30 DIAGNOSIS — H01139 Eczematous dermatitis of unspecified eye, unspecified eyelid: Secondary | ICD-10-CM

## 2012-04-30 HISTORY — DX: Eczematous dermatitis of unspecified eye, unspecified eyelid: H01.139

## 2012-04-30 NOTE — Patient Instructions (Signed)
Apply vasoline to both eyelids in a thin layer Call your eye doctor and let him know that you have eczema rash of the upper eyelids and we would like to do topical steroids but need him on board w/ this Call with any questions or concerns Hang in there!!!

## 2012-04-30 NOTE — Assessment & Plan Note (Signed)
New.  Pt needs topical steroids but due to possible eye absorption through the thin skin will have pt contact ophtho prior to starting tx.  Start vasoline in the interim.  Pt expressed understanding and is in agreement w/ plan.

## 2012-04-30 NOTE — Progress Notes (Signed)
  Subjective:    Patient ID: Andrea Heath, female    DOB: 05-Jun-1946, 66 y.o.   MRN: [id number]  HPI Rash- on eyelids bilaterally, dry, itchy, will crack and peel.  Applying neosporin daily w/out relief.  Eyes themselves not red or itchy.  No hx of eczema.  Hx of scleroderma.   Review of Systems For ROS see HPI     Objective:   Physical Exam  Vitals reviewed. Constitutional: She appears well-developed and well-nourished. No distress.  Skin: Skin is warm and dry. There is erythema (pt w/ eczematous areas on both upper lids).          Assessment & Plan:

## 2012-05-01 ENCOUNTER — Telehealth: Payer: Self-pay | Admitting: *Deleted

## 2012-05-01 NOTE — Telephone Encounter (Signed)
Called pt to clarify her request for records to be faxed to her eye doctor, pt advised that she explained to her eye MD assistant all the instructions MD Beverely Low had given her word for word however they still want to see her in the office, pt advised they offered her an apt for next week however she will be out of town and unable to attend the apt given, pt was then offered a later apt on 05-16-12 of this month, delegated instructions to pt that we can fax over her office notes to the eye doctor however MD Beverely Low does agree that the pt should be seen  by her eye MD per protocol not to dx someone over the phone is a practice that she follows as well, pt understood and advised she will call her eye MD and schedule an apt

## 2012-05-19 ENCOUNTER — Telehealth: Payer: Self-pay | Admitting: Family Medicine

## 2012-05-19 MED ORDER — SIMVASTATIN 80 MG PO TABS: 80.0000 mg | ORAL_TABLET | Freq: Every day | ORAL | Status: DC

## 2012-05-19 MED ORDER — GLIPIZIDE ER 5 MG PO TB24: 5.0000 mg | ORAL_TABLET | Freq: Every day | ORAL | Status: DC

## 2012-05-19 NOTE — Telephone Encounter (Signed)
rx sent to pharmacy by e-script  

## 2012-05-19 NOTE — Telephone Encounter (Signed)
Refill: simvastatin 80mg  tab #90. Take 1 tablet by mouth at bedtime. Last fill 02-18-12  Glipizide xl 5mg  tab #90. Take 1 tablet by mouth daily. Last fill 02-18-12

## 2012-05-20 ENCOUNTER — Other Ambulatory Visit: Payer: Self-pay | Admitting: Family Medicine

## 2012-05-20 MED ORDER — METOPROLOL TARTRATE 25 MG PO TABS: ORAL_TABLET | ORAL | Status: DC

## 2012-05-20 NOTE — Telephone Encounter (Signed)
rx sent to pharmacy by e-script  

## 2012-06-04 ENCOUNTER — Encounter: Payer: Self-pay | Admitting: *Deleted

## 2012-06-04 ENCOUNTER — Ambulatory Visit (INDEPENDENT_AMBULATORY_CARE_PROVIDER_SITE_OTHER): Payer: Medicare Other | Admitting: Family Medicine

## 2012-06-04 ENCOUNTER — Encounter: Payer: Self-pay | Admitting: Family Medicine

## 2012-06-04 VITALS — BP 124/78 | HR 66 | Temp 97.9°F | Ht 65.5 in | Wt 164.8 lb

## 2012-06-04 DIAGNOSIS — I10 Essential (primary) hypertension: Secondary | ICD-10-CM

## 2012-06-04 DIAGNOSIS — E119 Type 2 diabetes mellitus without complications: Secondary | ICD-10-CM

## 2012-06-04 DIAGNOSIS — R61 Generalized hyperhidrosis: Secondary | ICD-10-CM

## 2012-06-04 DIAGNOSIS — E785 Hyperlipidemia, unspecified: Secondary | ICD-10-CM

## 2012-06-04 LAB — CBC WITH DIFFERENTIAL/PLATELET
Basophils Relative: 0.3 % (ref 0.0–3.0)
Hemoglobin: 13.6 g/dL (ref 12.0–15.0)
Lymphocytes Relative: 31.4 % (ref 12.0–46.0)
Monocytes Relative: 7.4 % (ref 3.0–12.0)
Neutro Abs: 2.8 10*3/uL (ref 1.4–7.7)
Neutrophils Relative %: 59.4 % (ref 43.0–77.0)
RBC: 4.4 Mil/uL (ref 3.87–5.11)
WBC: 4.7 10*3/uL (ref 4.5–10.5)

## 2012-06-04 LAB — HEPATIC FUNCTION PANEL
ALT: 19 U/L (ref 0–35)
AST: 24 U/L (ref 0–37)
Bilirubin, Direct: 0.1 mg/dL (ref 0.0–0.3)
Total Protein: 6.9 g/dL (ref 6.0–8.3)

## 2012-06-04 LAB — TSH: TSH: 2.34 u[IU]/mL (ref 0.35–5.50)

## 2012-06-04 LAB — BASIC METABOLIC PANEL
BUN: 13 mg/dL (ref 6–23)
CO2: 28 mEq/L (ref 19–32)
Chloride: 110 mEq/L (ref 96–112)
Creatinine, Ser: 0.9 mg/dL (ref 0.4–1.2)
Potassium: 5 mEq/L (ref 3.5–5.1)

## 2012-06-04 LAB — LIPID PANEL
Cholesterol: 125 mg/dL (ref 0–200)
LDL Cholesterol: 60 mg/dL (ref 0–99)

## 2012-06-04 LAB — HEMOGLOBIN A1C: Hgb A1c MFr Bld: 6.2 % (ref 4.6–6.5)

## 2012-06-04 NOTE — Assessment & Plan Note (Signed)
Chronic problem.  Tolerating statin w/out difficulty.  Check labs.  Adjust meds prn  

## 2012-06-04 NOTE — Progress Notes (Signed)
  Subjective:    Patient ID: Andrea Heath, female    DOB: [birth year], 66 y.o.   MRN: [id number]  HPI DM- chronic problem, on Glipizide XL.  Denies CP, SOB, HAs, visual changes, edema.  Hyperlipidemia- chronic problem, on Zocor.  Denies abd pain, N/V, myalgias  HTN- chronic problem, well controlled today.  On lisinopril, metoprolol.  Asymptomatic.  Night sweats- chronic problem but increasing in frequency.  Now occuring 2-3x/week.  Menopausal by age 44.  Not checking sugars when they occur.  No nausea.  Not occuring during the day.   Review of Systems For ROS see HPI     Objective:   Physical Exam  Vitals reviewed. Constitutional: She is oriented to person, place, and time. She appears well-developed and well-nourished. No distress.  HENT:  Head: Normocephalic and atraumatic.  Eyes: Conjunctivae and EOM are normal. Pupils are equal, round, and reactive to light.  Neck: Normal range of motion. Neck supple. No thyromegaly present.  Cardiovascular: Normal rate, regular rhythm, normal heart sounds and intact distal pulses.   No murmur heard. Pulmonary/Chest: Effort normal and breath sounds normal. No respiratory distress.  Abdominal: Soft. She exhibits no distension. There is no tenderness.  Musculoskeletal: She exhibits no edema.  Lymphadenopathy:    She has no cervical adenopathy.  Neurological: She is alert and oriented to person, place, and time.  Skin: Skin is warm and dry.  Psychiatric: She has a normal mood and affect. Her behavior is normal.          Assessment & Plan:

## 2012-06-04 NOTE — Assessment & Plan Note (Signed)
Chronic problem.  Typically well controlled.  UTD on eye exam.  Foot exam done today.  Concern for symptomatic overnight lows.  Encouraged snack before bed.  Check labs.  Adjust meds prn.

## 2012-06-04 NOTE — Assessment & Plan Note (Signed)
Chronic for pt but worsening in frequency and severity recently.  Concern for low blood sugar.  Encouraged snack before bed.  Check labs to r/o infxn, thyroid, electrolyte abnormality, etc.  Reviewed supportive care and red flags that should prompt return.  Pt expressed understanding and is in agreement w/ plan.

## 2012-06-04 NOTE — Patient Instructions (Signed)
Follow up in 3 months to recheck diabetes We'll notify you of your lab results I'm worried that your hot flashes are your blood sugar dropping low Start having a small snack before bed w/ some protein (apples/peanut butter, cheese/crackers, yogurt, etc) Call with any questions or concerns Hang in there!!!

## 2012-06-04 NOTE — Assessment & Plan Note (Signed)
Chronic problem.  Excellent control.  Asymptomatic.  No changes. 

## 2012-08-14 ENCOUNTER — Other Ambulatory Visit: Payer: Self-pay | Admitting: Family Medicine

## 2012-08-18 ENCOUNTER — Telehealth: Payer: Self-pay | Admitting: *Deleted

## 2012-08-18 NOTE — Telephone Encounter (Signed)
Pt advised that she had 2 refills that our office has not sent yet that were requested last week for Glipizide and zocor, advised pt that she needs to contact her pharmacy per we sent both the refills on 08-14-12 for #90 with 1 refill, pt advised [facility] told her to call us, called [facility] and spoke to rep April whom advised they had problems with their fax machine on Friday and did not receive some of their fax machine was messed up on Friday and took a verbal order for the RX's Glipizide and Zocor #90 with 1 refill on both, take as directed. Called pt to advise and noted that they told her it was our office, she will give them time to refill and go pick up RX, pt thanked me and understood all instructions

## 2012-09-08 ENCOUNTER — Ambulatory Visit (INDEPENDENT_AMBULATORY_CARE_PROVIDER_SITE_OTHER): Payer: Medicare Other | Admitting: Family Medicine

## 2012-09-08 ENCOUNTER — Encounter: Payer: Self-pay | Admitting: Family Medicine

## 2012-09-08 VITALS — BP 115/75 | HR 64 | Temp 98.3°F | Ht 65.25 in | Wt 154.8 lb

## 2012-09-08 DIAGNOSIS — M349 Systemic sclerosis, unspecified: Secondary | ICD-10-CM

## 2012-09-08 DIAGNOSIS — M2550 Pain in unspecified joint: Secondary | ICD-10-CM | POA: Insufficient documentation

## 2012-09-08 DIAGNOSIS — E785 Hyperlipidemia, unspecified: Secondary | ICD-10-CM

## 2012-09-08 DIAGNOSIS — Z23 Encounter for immunization: Secondary | ICD-10-CM

## 2012-09-08 DIAGNOSIS — E119 Type 2 diabetes mellitus without complications: Secondary | ICD-10-CM

## 2012-09-08 HISTORY — DX: Pain in unspecified joint: M25.50

## 2012-09-08 LAB — BASIC METABOLIC PANEL
BUN: 11 mg/dL (ref 6–23)
Calcium: 9.1 mg/dL (ref 8.4–10.5)
Creatinine, Ser: 0.9 mg/dL (ref 0.4–1.2)
GFR: 63.81 mL/min (ref >60.00)
Glucose, Bld: 133 mg/dL — ABNORMAL HIGH (ref 70–99)
Potassium: 4.5 mEq/L (ref 3.5–5.1)

## 2012-09-08 LAB — SEDIMENTATION RATE: Sed Rate: 19 mm/hr (ref 0–22)

## 2012-09-08 MED ORDER — ATORVASTATIN CALCIUM 20 MG PO TABS: 20.0000 mg | ORAL_TABLET | Freq: Every day | ORAL | Status: DC

## 2012-09-08 NOTE — Assessment & Plan Note (Signed)
Chronic problem.  Due to pain will switch from Zocor 80 mg to Lipitor 20mg .

## 2012-09-08 NOTE — Assessment & Plan Note (Signed)
Chronic problem.  Typically well controlled.  UTD on eye exam.  Tolerating meds w/out difficulty.  Check labs.  Adjust meds prn

## 2012-09-08 NOTE — Patient Instructions (Addendum)
Schedule your complete physical for December We'll notify you of your lab results and make any changes if needed Someone will call you with your rheumatology appt Call with any questions or concerns Enjoy the wedding!!!!

## 2012-09-08 NOTE — Assessment & Plan Note (Signed)
New.  No TTP over multiple trigger points making fibromyalgia less likely.  Pt w/ already dx'd rheumatologic dz (scleroderma).  Check some preliminary labs and refer to Rheum.  Must consider possibility of statin related pain.  Based on this, pt would like to switch to lower dose of more potent med.  Switch from Zocor to Lipitor.

## 2012-09-08 NOTE — Assessment & Plan Note (Signed)
Chronic problem.  Not currently following w/ rheum.  Will refer.

## 2012-09-08 NOTE — Progress Notes (Signed)
  Subjective:    Patient ID: Andrea Heath, female    DOB: 1946/02/03, 66 y.o.   MRN: [id number]  HPI DM- chronic problem, typically well controlled.  On Glipizide daily.  CBGs remain labile.  UTD on eye exam (upcoming in Oct).  Denies numbness/tingling in feet.  No CP, SOB, HAs, edema.  Myalgias/arthralgias- hx of scleroderma, previously saw Dr Coral Spikes (not interested in going back).  Having leg pain, hip pain, back pain.  Rarely the arms.  Unable to determine if it's more bone or muscle.   Review of Systems For ROS see HPI     Objective:   Physical Exam  Vitals reviewed. Constitutional: She is oriented to person, place, and time. She appears well-developed and well-nourished. No distress.  HENT:  Head: Normocephalic and atraumatic.  Eyes: Conjunctivae and EOM are normal. Pupils are equal, round, and reactive to light.  Neck: Normal range of motion. Neck supple. No thyromegaly present.  Cardiovascular: Normal rate, regular rhythm, normal heart sounds and intact distal pulses.   No murmur heard. Pulmonary/Chest: Effort normal and breath sounds normal. No respiratory distress.  Abdominal: Soft. She exhibits no distension. There is no tenderness.  Musculoskeletal: She exhibits no edema and no tenderness (no TTP over multiple trigger points).  Lymphadenopathy:    She has no cervical adenopathy.  Neurological: She is alert and oriented to person, place, and time.  Skin: Skin is warm and dry.  Psychiatric: She has a normal mood and affect. Her behavior is normal.          Assessment & Plan:

## 2012-09-09 ENCOUNTER — Encounter: Payer: Self-pay | Admitting: *Deleted

## 2012-10-06 ENCOUNTER — Telehealth: Payer: Self-pay | Admitting: Family Medicine

## 2012-10-06 NOTE — Telephone Encounter (Signed)
Please advise per notations in pt recent ov as follows:  Pain, joint, multiple sites - Neena Rhymes, MD 09/08/2012 1:45 PM Signed  New. No TTP over multiple trigger points making fibromyalgia less likely. Pt w/ already dx'd rheumatologic dz (scleroderma). Check some preliminary labs and refer to Rheum. Must consider possibility of statin related pain. Based on this, pt would like to switch to lower dose of more potent med. Switch from Zocor to Lipitor.

## 2012-10-06 NOTE — Telephone Encounter (Signed)
pt called stated Rheumatologist does not do blood work that she says dr.tabori requested to be done. I do not see orders in system can you call patient back & review with her cb# [phone]

## 2012-10-06 NOTE — Telephone Encounter (Signed)
I don't want rheum to do any particular labs.  Pt already has rheumatologic dx (scleroderma) and wanted her to see them for her joint pains in case this was rheum related.  They will determine after evaluating pt whether labs are required

## 2012-10-07 ENCOUNTER — Other Ambulatory Visit: Payer: Medicare Other

## 2012-10-07 DIAGNOSIS — D6861 Antiphospholipid syndrome: Secondary | ICD-10-CM

## 2012-10-07 DIAGNOSIS — D841 Defects in the complement system: Secondary | ICD-10-CM

## 2012-10-07 DIAGNOSIS — E8589 Other amyloidosis: Secondary | ICD-10-CM

## 2012-10-07 LAB — COMPREHENSIVE METABOLIC PANEL
Alkaline Phosphatase: 69 U/L (ref 39–117)
BUN: 13 mg/dL (ref 6–23)
CO2: 29 mEq/L (ref 19–32)
Creat: 0.92 mg/dL (ref 0.50–1.10)
Glucose, Bld: 118 mg/dL — ABNORMAL HIGH (ref 70–99)
Sodium: 143 mEq/L (ref 135–145)
Total Bilirubin: 0.5 mg/dL (ref 0.3–1.2)
Total Protein: 7.2 g/dL (ref 6.0–8.3)

## 2012-10-07 LAB — URINALYSIS, ROUTINE W REFLEX MICROSCOPIC
Bilirubin Urine: NEGATIVE
Glucose, UA: NEGATIVE mg/dL
Hgb urine dipstick: NEGATIVE
Ketones, ur: NEGATIVE mg/dL
Protein, ur: NEGATIVE mg/dL
Urobilinogen, UA: 0.2 mg/dL (ref 0.0–1.0)

## 2012-10-07 LAB — CBC WITH DIFFERENTIAL/PLATELET
Basophils Absolute: 0 10*3/uL (ref 0.0–0.1)
Basophils Relative: 0 % (ref 0–1)
Eosinophils Absolute: 0.1 10*3/uL (ref 0.0–0.7)
Eosinophils Relative: 2 % (ref 0–5)
Lymphocytes Relative: 31 % (ref 12–46)
MCH: 30.5 pg (ref 26.0–34.0)
MCHC: 34.2 g/dL (ref 30.0–36.0)
MCV: 89.3 fL (ref 78.0–100.0)
Monocytes Absolute: 0.4 10*3/uL (ref 0.1–1.0)
Platelets: 217 10*3/uL (ref 150–400)
RDW: 12.8 % (ref 11.5–15.5)
WBC: 5.1 10*3/uL (ref 4.0–10.5)

## 2012-10-07 NOTE — Telephone Encounter (Signed)
Noted pt came into office and had labs/UA drawn per order form brought in with pt from [facility], MD Beverely Low made aware of labs drawn and will send to rheum when resulted,

## 2012-10-07 NOTE — Telephone Encounter (Signed)
.  left message to have patient return my call with family member 

## 2012-10-07 NOTE — Progress Notes (Unsigned)
Talked to [facility] Dr Corliss Skains assistant to get diagnosis code for pt,  she was not a 100 percent sure as well  with codes.Marland Kitchen  st

## 2012-10-08 ENCOUNTER — Telehealth: Payer: Self-pay | Admitting: Family Medicine

## 2012-10-08 LAB — ANA: Anti Nuclear Antibody(ANA): POSITIVE — AB

## 2012-10-08 NOTE — Telephone Encounter (Signed)
Incoming labs noted by MD Beverely Low to fax to MD Devashawar, manually faxed to Arsenio Loader MD

## 2012-10-08 NOTE — Telephone Encounter (Signed)
FYI

## 2012-10-08 NOTE — Telephone Encounter (Signed)
[facility] Triage Call Report Triage Record Num: [id number] Operator: Albertine Grates Patient Name: Andrea Heath Call Date & Time: 10/07/2012 6:48:19PM Patient Phone: [phone] PCP: Lezlie Octave Patient Gender: Female PCP Fax : [phone] Patient DOB: [birth year] Practice Name: [facility] Reason for Call: Caller: Zella Ball; PCP: Other; CB#: [phone]; Call regarding Zella Ball is calling from [facility] regarding a CMP and CBC; labs ordered by Dr. Deveshwar/Rheumatologist. No critical results. UA negative. Will send results to office as per [facility], MD will fax result to rheumatologist when resulted. Protocol(s) Used: Office Note Recommended Outcome per Protocol: Information Noted and Sent to Office Reason for Outcome: Caller information to office Care Advice: ~

## 2012-10-20 ENCOUNTER — Other Ambulatory Visit: Payer: Self-pay | Admitting: Family Medicine

## 2012-10-20 ENCOUNTER — Telehealth: Payer: Self-pay | Admitting: Family Medicine

## 2012-10-20 DIAGNOSIS — Z780 Asymptomatic menopausal state: Secondary | ICD-10-CM

## 2012-10-20 NOTE — Telephone Encounter (Signed)
Not sure why the rush but Rheum can also order it if needed.  Will do orders encounter and put this in.

## 2012-10-20 NOTE — Telephone Encounter (Signed)
Please advise if pt needs to wait til 12-13 CPE to discuss bone density scan per wants now if possible

## 2012-10-20 NOTE — Telephone Encounter (Signed)
Does need DEXA- will order at upcoming CPE

## 2012-10-20 NOTE — Telephone Encounter (Signed)
Noted MD Tabori placed orders in chart, pt aware someone from this office will call her with this apt

## 2012-10-20 NOTE — Telephone Encounter (Signed)
Called pt to advise MD Tabori instructions to discuss at upcoming apt, pt stated that her Rhuem MD wants her to have this performed before her next OV with MD Beverely Low, please advise

## 2012-10-20 NOTE — Telephone Encounter (Signed)
Patient states she needs referral for bone density scan. She can be reached at her home number or mobile # [phone]. Ok to leave message on either line.

## 2012-10-22 ENCOUNTER — Ambulatory Visit (INDEPENDENT_AMBULATORY_CARE_PROVIDER_SITE_OTHER): Payer: Medicare Other | Admitting: Internal Medicine

## 2012-10-22 DIAGNOSIS — M349 Systemic sclerosis, unspecified: Secondary | ICD-10-CM

## 2012-10-22 LAB — PULMONARY FUNCTION TEST

## 2012-10-22 NOTE — Progress Notes (Signed)
PFT done today. 

## 2012-10-27 ENCOUNTER — Ambulatory Visit
Admission: RE | Admit: 2012-10-27 | Discharge: 2012-10-27 | Disposition: A | Discharge status: Home / Self Care | Payer: BC Managed Care – PPO | Source: Ambulatory Visit | Attending: Family Medicine | Admitting: Family Medicine

## 2012-10-27 DIAGNOSIS — Z780 Asymptomatic menopausal state: Secondary | ICD-10-CM

## 2012-11-11 ENCOUNTER — Telehealth: Payer: Self-pay | Admitting: Family Medicine

## 2012-11-11 MED ORDER — LISINOPRIL 10 MG PO TABS: 10.0000 mg | ORAL_TABLET | Freq: Every day | ORAL | Status: DC

## 2012-11-11 MED ORDER — METOPROLOL TARTRATE 25 MG PO TABS: ORAL_TABLET | ORAL | Status: DC

## 2012-11-11 MED ORDER — FLUTICASONE PROPIONATE 50 MCG/ACT NA SUSP: 2.0000 | Freq: Every day | NASAL | Status: DC

## 2012-11-11 NOTE — Telephone Encounter (Signed)
add 2 more Lisinopril #90 take 1 tablet by mouth daily  & fluticasone #16 Use 2 sprays in each nostril daily  both last fill 8.16.13 Last OV 9.10.13 follow up

## 2012-11-11 NOTE — Telephone Encounter (Signed)
Refill: Metoprolol tartrate 25 mg. Take 1 tablet by mouth daily. Qty 90. Last fill 08-14-12

## 2012-11-11 NOTE — Telephone Encounter (Signed)
Rx sent.    MW 

## 2012-11-16 ENCOUNTER — Encounter: Payer: Self-pay | Admitting: Family Medicine

## 2012-12-08 ENCOUNTER — Ambulatory Visit (INDEPENDENT_AMBULATORY_CARE_PROVIDER_SITE_OTHER): Payer: Medicare Other | Admitting: Family Medicine

## 2012-12-08 ENCOUNTER — Encounter: Payer: Self-pay | Admitting: Family Medicine

## 2012-12-08 VITALS — BP 140/78 | HR 63 | Temp 97.8°F | Ht 65.25 in | Wt 159.8 lb

## 2012-12-08 DIAGNOSIS — E785 Hyperlipidemia, unspecified: Secondary | ICD-10-CM

## 2012-12-08 DIAGNOSIS — I10 Essential (primary) hypertension: Secondary | ICD-10-CM

## 2012-12-08 DIAGNOSIS — Z0000 Encounter for general adult medical examination without abnormal findings: Secondary | ICD-10-CM

## 2012-12-08 DIAGNOSIS — E119 Type 2 diabetes mellitus without complications: Secondary | ICD-10-CM

## 2012-12-08 LAB — BASIC METABOLIC PANEL WITH GFR
BUN: 11 mg/dL (ref 6–23)
CO2: 26 meq/L (ref 19–32)
Calcium: 8.7 mg/dL (ref 8.4–10.5)
Chloride: 108 meq/L (ref 96–112)
Creatinine, Ser: 1 mg/dL (ref 0.4–1.2)
GFR: 56.05 mL/min — ABNORMAL LOW
Glucose, Bld: 158 mg/dL — ABNORMAL HIGH (ref 70–99)
Potassium: 4.2 meq/L (ref 3.5–5.1)
Sodium: 140 meq/L (ref 135–145)

## 2012-12-08 LAB — LIPID PANEL
Cholesterol: 138 mg/dL (ref 0–200)
HDL: 33.8 mg/dL — ABNORMAL LOW
LDL Cholesterol: 75 mg/dL (ref 0–99)
Total CHOL/HDL Ratio: 4
Triglycerides: 146 mg/dL (ref 0.0–149.0)
VLDL: 29.2 mg/dL (ref 0.0–40.0)

## 2012-12-08 LAB — CBC WITH DIFFERENTIAL/PLATELET
Basophils Relative: 0.3 % (ref 0.0–3.0)
Eosinophils Relative: 1.5 % (ref 0.0–5.0)
HCT: 40 % (ref 36.0–46.0)
Hemoglobin: 13.1 g/dL (ref 12.0–15.0)
Lymphocytes Relative: 30.6 % (ref 12.0–46.0)
Lymphs Abs: 1.6 10*3/uL (ref 0.7–4.0)
Monocytes Relative: 6.7 % (ref 3.0–12.0)
Neutro Abs: 3.1 10*3/uL (ref 1.4–7.7)
RBC: 4.35 Mil/uL (ref 3.87–5.11)
RDW: 13.1 % (ref 11.5–14.6)
WBC: 5.1 10*3/uL (ref 4.5–10.5)

## 2012-12-08 LAB — HEMOGLOBIN A1C: Hgb A1c MFr Bld: 6.3 % (ref 4.6–6.5)

## 2012-12-08 LAB — HEPATIC FUNCTION PANEL
ALT: 20 U/L (ref 0–35)
Albumin: 3.6 g/dL (ref 3.5–5.2)
Bilirubin, Direct: 0.1 mg/dL (ref 0.0–0.3)
Total Protein: 6.8 g/dL (ref 6.0–8.3)

## 2012-12-08 LAB — MICROALBUMIN / CREATININE URINE RATIO
Microalb Creat Ratio: 0.4 mg/g (ref 0.0–30.0)
Microalb, Ur: 1.1 mg/dL (ref 0.0–1.9)

## 2012-12-08 NOTE — Assessment & Plan Note (Signed)
Chronic problem.  Tolerating statin w/out difficulty.  Plans to finish remaining simvastatin and then switch to lipitor.  Check labs.  Adjust meds prn

## 2012-12-08 NOTE — Assessment & Plan Note (Signed)
Pt's PE WNL.  UTD on health maintenance.  Check labs.  Anticipatory guidance provided.  

## 2012-12-08 NOTE — Patient Instructions (Addendum)
Follow up in 3 months to recheck diabetes We'll notify you of your lab results and make any changes if needed Keep up the good work!  You look great!! Call with any questions or concerns Happy Holidays!!!

## 2012-12-08 NOTE — Assessment & Plan Note (Signed)
Chronic problem.  Typically well controlled.  UTD on eye exam.  Foot exam done today.  Asymptomatic.  On ACE for renal protection but will get microalbumin.  Check labs.  Adjust meds prn

## 2012-12-08 NOTE — Progress Notes (Signed)
  Subjective:    Patient ID: Andrea Heath, female    DOB: August 16, 1946, 66 y.o.   MRN: [id number]  HPI Here today for CPE.  Risk Factors: DM- chronic problem, typically well controlled on Glipizide.  CBGs remain labile.  Denies symptomatic lows.  No numbness/tingling in hands/feet. Hyperlipidemia- chronic problem, currently on Simvastatin b/c she gets 90 days worth, plans to start Lipitor when she runs out.  No abd pain, N/V. HTN- chronic problem, minimally elevated today.  No CP, SOB, HAs, visual changes, edema.  On metoprolol and lisinopril Physical Activity: exercising regularly at [employer] Fall Risk: low risk Depression: chronic problem, sxs well controlled Hearing: normal to conversational tones and whispered voice at 6 ft ADL's: independent Cognitive: normal linear thought process, memory and attention intact Home Safety: safe at home, lives w/ husband Height, Weight, BMI, Visual Acuity: see vitals, vision corrected to 20/20 w/ glasses Counseling: UTD on colonoscopy, mammo, DEXA.  No need for paps at this time Labs Ordered: See A&P Care Plan: See A&P    Review of Systems Patient reports no vision/ hearing changes, adenopathy,fever, weight change,  persistant/recurrent hoarseness , swallowing issues, chest pain, palpitations, edema, persistant/recurrent cough, hemoptysis, dyspnea (rest/exertional/paroxysmal nocturnal), gastrointestinal bleeding (melena, rectal bleeding), abdominal pain, significant heartburn, bowel changes, GU symptoms (dysuria, hematuria, incontinence), Gyn symptoms (abnormal  bleeding, pain),  syncope, focal weakness, memory loss, numbness & tingling, skin/hair/nail changes, abnormal bruising or bleeding, anxiety, or depression.     Objective:   Physical Exam General Appearance:    Alert, cooperative, no distress, appears stated age  Head:    Normocephalic, without obvious abnormality, atraumatic  Eyes:    PERRL, conjunctiva/corneas clear, EOM's intact, fundi   benign, both eyes  Ears:    Normal TM's and external ear canals, both ears  Nose:   Nares normal, septum midline, mucosa normal, no drainage    or sinus tenderness  Throat:   Lips, mucosa, and tongue normal; teeth and gums normal  Neck:   Supple, symmetrical, trachea midline, no adenopathy;    Thyroid: no enlargement/tenderness/nodules  Back:     Symmetric, no curvature, ROM normal, no CVA tenderness  Lungs:     Clear to auscultation bilaterally, respirations unlabored  Chest Wall:    No tenderness or deformity   Heart:    Regular rate and rhythm, S1 and S2 normal, no murmur, rub   or gallop  Breast Exam:    Deferred to mammo  Abdomen:     Soft, non-tender, bowel sounds active all four quadrants,    no masses, no organomegaly  Genitalia:    Deferred due to age  Rectal:    Extremities:   Extremities normal, atraumatic, no cyanosis or edema  Pulses:   2+ and symmetric all extremities  Skin:   Skin color, texture, turgor normal, no rashes or lesions  Lymph nodes:   Cervical, supraclavicular, and axillary nodes normal  Neurologic:   CNII-XII intact, normal strength, sensation and reflexes    throughout          Assessment & Plan:

## 2012-12-08 NOTE — Assessment & Plan Note (Signed)
Chronic problem.  Adequate control.  Slightly elevated today- pt admits to hurrying this AM to get here.  No med changes but will continue to follow closely.

## 2012-12-11 LAB — VITAMIN D 1,25 DIHYDROXY
Vitamin D2 1, 25 (OH)2: <8 pg/mL
Vitamin D3 1, 25 (OH)2: 58 pg/mL

## 2013-02-09 ENCOUNTER — Ambulatory Visit (INDEPENDENT_AMBULATORY_CARE_PROVIDER_SITE_OTHER): Payer: Medicare Other | Admitting: Family Medicine

## 2013-02-09 ENCOUNTER — Encounter: Payer: Self-pay | Admitting: Family Medicine

## 2013-02-09 VITALS — BP 126/68 | HR 68 | Temp 98.1°F | Wt 176.0 lb

## 2013-02-09 DIAGNOSIS — E785 Hyperlipidemia, unspecified: Secondary | ICD-10-CM

## 2013-02-09 DIAGNOSIS — E119 Type 2 diabetes mellitus without complications: Secondary | ICD-10-CM

## 2013-02-09 NOTE — Assessment & Plan Note (Signed)
Chronic problem, tolerating switch to Lipitor w/out difficulty.  Will repeat labs in 3 months.  No changes at this time.

## 2013-02-09 NOTE — Progress Notes (Signed)
  Subjective:    Patient ID: Andrea Heath, female    DOB: [birth year], 67 y.o.   MRN: [id number]  HPI DM- pt's last A1C was 6.3 (2 months ago), CBGs ranging 89-175.  UTD on eye exam, no numbness/tingling.  No CP, SOB, HAs, visual changes.  Will have some symptomatic lows w/ exercise.  On glipizide daily.  Hyperlipidemia- chronic problem, switched to Lipitor from Simvastatin.  No abd pain, N/V, myalgias.   Review of Systems For ROS see HPI     Objective:   Physical Exam  Constitutional: She is oriented to person, place, and time. She appears well-developed and well-nourished. No distress.  HENT:  Head: Normocephalic and atraumatic.  Eyes: Conjunctivae and EOM are normal. Pupils are equal, round, and reactive to light.  Neck: Normal range of motion. Neck supple. No thyromegaly present.  Cardiovascular: Normal rate, regular rhythm, normal heart sounds and intact distal pulses.   No murmur heard. Pulmonary/Chest: Effort normal and breath sounds normal. No respiratory distress.  Abdominal: Soft. She exhibits no distension. There is no tenderness.  Musculoskeletal: She exhibits no edema.  Lymphadenopathy:    She has no cervical adenopathy.  Neurological: She is alert and oriented to person, place, and time.  Skin: Skin is warm and dry.  Psychiatric: She has a normal mood and affect. Her behavior is normal.          Assessment & Plan:

## 2013-02-09 NOTE — Patient Instructions (Addendum)
Follow up in 3 months to recheck diabetes Keep up the good work!  You look great! Call with any questions or concerns Drive safe if you decide to go Happy Early Birthday!

## 2013-02-09 NOTE — Assessment & Plan Note (Signed)
Chronic problem.  CBGs remain labile but pt's last A1C indicated good control.  Pt too early for repeat A1C today.  Encouraged her to continue healthy diet and regular exercise.  Will follow.

## 2013-02-15 ENCOUNTER — Telehealth: Payer: Self-pay | Admitting: Family Medicine

## 2013-02-15 NOTE — Telephone Encounter (Signed)
refill  GlipiZIDE XL 5 MG TAKE 1 TABLET BY MOUTH ONCE A DAY #90 last fill 11.13.13

## 2013-02-16 MED ORDER — GLIPIZIDE ER 5 MG PO TB24: 5.0000 mg | ORAL_TABLET | Freq: Every day | ORAL | Status: DC

## 2013-02-16 NOTE — Telephone Encounter (Signed)
Rx sent to the pharmacy([facility]) by e-script.//AB/CMA

## 2013-05-11 ENCOUNTER — Ambulatory Visit (INDEPENDENT_AMBULATORY_CARE_PROVIDER_SITE_OTHER): Payer: Medicare Other | Admitting: Family Medicine

## 2013-05-11 ENCOUNTER — Encounter: Payer: Self-pay | Admitting: Family Medicine

## 2013-05-11 VITALS — BP 128/78 | HR 68 | Temp 98.3°F | Ht 64.75 in | Wt 164.8 lb

## 2013-05-11 DIAGNOSIS — E119 Type 2 diabetes mellitus without complications: Secondary | ICD-10-CM

## 2013-05-11 LAB — BASIC METABOLIC PANEL
BUN: 9 mg/dL (ref 6–23)
CO2: 28 mEq/L (ref 19–32)
Calcium: 9 mg/dL (ref 8.4–10.5)
Glucose, Bld: 164 mg/dL — ABNORMAL HIGH (ref 70–99)
Sodium: 140 mEq/L (ref 135–145)

## 2013-05-11 NOTE — Patient Instructions (Addendum)
Follow up in 3 months to recheck sugar and cholesterol We'll notify you of your lab results and make any changes if needed Keep up the good work!!!  You look great! Happy Belated Birthday!

## 2013-05-11 NOTE — Progress Notes (Signed)
  Subjective:    Patient ID: Andrea Heath, female    DOB: July 27, 1946, 67 y.o.   MRN: [id number]  HPI DM- chronic problem.  On Glipizide daily.  Typically well controlled.  UTD on eye exam.  On Lisinopril for renal protection.  Pt reports CBGs are labile.  Exercising regularly.  No symptomatic lows.  No CP, SOB, HAs, edema, N/V/D.  Review of Systems For ROS see HPI     Objective:   Physical Exam  Vitals reviewed. Constitutional: She is oriented to person, place, and time. She appears well-developed and well-nourished. No distress.  HENT:  Head: Normocephalic and atraumatic.  Eyes: Conjunctivae and EOM are normal. Pupils are equal, round, and reactive to light.  Neck: Normal range of motion. Neck supple. No thyromegaly present.  Cardiovascular: Normal rate, regular rhythm, normal heart sounds and intact distal pulses.   No murmur heard. Pulmonary/Chest: Effort normal and breath sounds normal. No respiratory distress.  Abdominal: Soft. She exhibits no distension. There is no tenderness.  Musculoskeletal: She exhibits no edema.  Lymphadenopathy:    She has no cervical adenopathy.  Neurological: She is alert and oriented to person, place, and time.  Skin: Skin is warm and dry.  Psychiatric: She has a normal mood and affect. Her behavior is normal.          Assessment & Plan:

## 2013-05-11 NOTE — Assessment & Plan Note (Signed)
Chronic problem, on glipizide.  Asymptomatic.  Check labs.  Adjust meds prn

## 2013-05-12 ENCOUNTER — Other Ambulatory Visit: Payer: Self-pay | Admitting: Family Medicine

## 2013-08-12 ENCOUNTER — Ambulatory Visit: Payer: Medicare Other | Admitting: Family Medicine

## 2013-08-17 ENCOUNTER — Ambulatory Visit (INDEPENDENT_AMBULATORY_CARE_PROVIDER_SITE_OTHER): Payer: Medicare Other | Admitting: Family Medicine

## 2013-08-17 ENCOUNTER — Encounter: Payer: Self-pay | Admitting: Family Medicine

## 2013-08-17 VITALS — BP 130/80 | HR 59 | Temp 98.4°F | Ht 64.75 in | Wt 159.6 lb

## 2013-08-17 DIAGNOSIS — E785 Hyperlipidemia, unspecified: Secondary | ICD-10-CM

## 2013-08-17 DIAGNOSIS — I10 Essential (primary) hypertension: Secondary | ICD-10-CM

## 2013-08-17 DIAGNOSIS — E119 Type 2 diabetes mellitus without complications: Secondary | ICD-10-CM

## 2013-08-17 LAB — LIPID PANEL
HDL: 34.7 mg/dL — ABNORMAL LOW (ref >39.00)
Total CHOL/HDL Ratio: 4
VLDL: 24.8 mg/dL (ref 0.0–40.0)

## 2013-08-17 LAB — BASIC METABOLIC PANEL
BUN: 10 mg/dL (ref 6–23)
Creatinine, Ser: 1 mg/dL (ref 0.4–1.2)
GFR: 55.93 mL/min — ABNORMAL LOW (ref >60.00)
Glucose, Bld: 148 mg/dL — ABNORMAL HIGH (ref 70–99)
Potassium: 4.5 mEq/L (ref 3.5–5.1)

## 2013-08-17 LAB — HEPATIC FUNCTION PANEL
AST: 20 U/L (ref 0–37)
Total Bilirubin: 0.8 mg/dL (ref 0.3–1.2)

## 2013-08-17 NOTE — Progress Notes (Signed)
  Subjective:    Patient ID: Andrea Heath, female    DOB: [birth year], 67 y.o.   MRN: [id number]  HPI DM- chronic problem, typically well controlled on glipizide.  No CP, SOB, HAs, visual changes, edema.  No numbness/tingling.  No symptomatic lows.  Hyperlipidemia- chronic problem, on Lipitor.  No abd pain, N/V, myalgias.  HTN- chronic problem, on Metoprolol and Lisinopril.  Asymptomatic.   Review of Systems For ROS see HPI     Objective:   Physical Exam  Vitals reviewed. Constitutional: She is oriented to person, place, and time. She appears well-developed and well-nourished. No distress.  HENT:  Head: Normocephalic and atraumatic.  Eyes: Conjunctivae and EOM are normal. Pupils are equal, round, and reactive to light.  Neck: Normal range of motion. Neck supple. No thyromegaly present.  Cardiovascular: Normal rate, regular rhythm, normal heart sounds and intact distal pulses.   No murmur heard. Pulmonary/Chest: Effort normal and breath sounds normal. No respiratory distress.  Abdominal: Soft. She exhibits no distension. There is no tenderness.  Musculoskeletal: She exhibits no edema.  Lymphadenopathy:    She has no cervical adenopathy.  Neurological: She is alert and oriented to person, place, and time.  Skin: Skin is warm and dry.  Psychiatric: She has a normal mood and affect. Her behavior is normal.          Assessment & Plan:

## 2013-08-17 NOTE — Assessment & Plan Note (Signed)
Chronic problem.  Asymptomatic.  Typically well controlled.  UTD on eye exam.  Check labs.  Adjust meds prn

## 2013-08-17 NOTE — Patient Instructions (Addendum)
Schedule your complete physical for December We'll notify you of your lab results and make any changes if needed Keep up the good work!!  You look great! Call with any questions or concerns Happy Labor Day!!!

## 2013-08-17 NOTE — Assessment & Plan Note (Signed)
Chronic problem.  Tolerating statin w/out difficulty.  Check labs.  Adjust meds prn  

## 2013-08-17 NOTE — Assessment & Plan Note (Signed)
Chronic problem.  Well controlled.  Asymptomatic.  Check labs.  No anticipated med changes. 

## 2013-09-02 ENCOUNTER — Other Ambulatory Visit: Payer: Self-pay | Admitting: Family Medicine

## 2013-09-03 NOTE — Telephone Encounter (Signed)
Medication filled. SW, CMA

## 2013-09-21 ENCOUNTER — Ambulatory Visit (INDEPENDENT_AMBULATORY_CARE_PROVIDER_SITE_OTHER): Payer: Medicare Other

## 2013-09-21 DIAGNOSIS — Z23 Encounter for immunization: Secondary | ICD-10-CM

## 2013-11-08 ENCOUNTER — Other Ambulatory Visit: Payer: Self-pay | Admitting: Family Medicine

## 2013-11-08 NOTE — Telephone Encounter (Signed)
Med filled.  

## 2013-11-11 ENCOUNTER — Telehealth: Payer: Self-pay | Admitting: *Deleted

## 2013-11-11 NOTE — Telephone Encounter (Signed)
If it's hair loss- she may need her thyroid checked

## 2013-11-11 NOTE — Telephone Encounter (Signed)
Patients states that she is not able to get the Bystolic until January according to her insurance company. Patient has an upcoming appointment in December and will discuss this with Dr. Beverely Low. Patient would like to have her thyroid  Check at this appointment.

## 2013-11-11 NOTE — Telephone Encounter (Signed)
We can switch to Bystolic 5mg  daily but there may be an issue w/ insurance coverage since it's not generic.

## 2013-11-11 NOTE — Telephone Encounter (Signed)
Patient called and that she is having significant hair loss. Patient went to pick up medication at pharmacy and was told that the lisinopril and her lopressor could be the cause of her hair loss. Patient states that the pharmacy suggests that she try bystolic and see if that helps. Patient would like to know you opinion. Please advise. SW

## 2013-11-18 ENCOUNTER — Other Ambulatory Visit: Payer: Self-pay | Admitting: Family Medicine

## 2013-11-18 NOTE — Telephone Encounter (Signed)
Med filled.  

## 2013-12-10 ENCOUNTER — Telehealth: Payer: Self-pay

## 2013-12-10 NOTE — Telephone Encounter (Signed)
Medication and allergies:  Reviewed and updated  90 day supply/mail order: na Local pharmacy: [facility]   Immunizations due:  Tdap  A/P:   No changes to FH or PSH Pap--gyn MMG--10/2012--benign findings Bone Density--09/2022 CCS--02/2010--next due 2016 Flu vaccine--08/2013 Shingles--06/2008 PNA--11/2010  To Discuss with Provider: Possibility of changing BP meds. Feels the one she is on is causing hair loss.

## 2013-12-13 ENCOUNTER — Encounter: Payer: Self-pay | Admitting: Family Medicine

## 2013-12-13 ENCOUNTER — Ambulatory Visit (INDEPENDENT_AMBULATORY_CARE_PROVIDER_SITE_OTHER): Payer: Medicare Other | Admitting: Family Medicine

## 2013-12-13 VITALS — BP 126/80 | HR 69 | Temp 98.2°F | Resp 16 | Ht 65.0 in | Wt 161.0 lb

## 2013-12-13 DIAGNOSIS — Z0000 Encounter for general adult medical examination without abnormal findings: Secondary | ICD-10-CM

## 2013-12-13 DIAGNOSIS — E119 Type 2 diabetes mellitus without complications: Secondary | ICD-10-CM

## 2013-12-13 DIAGNOSIS — Z23 Encounter for immunization: Secondary | ICD-10-CM

## 2013-12-13 DIAGNOSIS — E785 Hyperlipidemia, unspecified: Secondary | ICD-10-CM

## 2013-12-13 DIAGNOSIS — I10 Essential (primary) hypertension: Secondary | ICD-10-CM

## 2013-12-13 LAB — CBC WITH DIFFERENTIAL/PLATELET
Basophils Absolute: 0 K/uL (ref 0.0–0.1)
Basophils Relative: 0.5 % (ref 0.0–3.0)
Eosinophils Absolute: 0.1 K/uL (ref 0.0–0.7)
Eosinophils Relative: 1.4 % (ref 0.0–5.0)
HCT: 41.8 % (ref 36.0–46.0)
Hemoglobin: 14.2 g/dL (ref 12.0–15.0)
Lymphocytes Relative: 29.3 % (ref 12.0–46.0)
Lymphs Abs: 1.8 K/uL (ref 0.7–4.0)
MCHC: 33.9 g/dL (ref 30.0–36.0)
MCV: 89.6 fl (ref 78.0–100.0)
Monocytes Absolute: 0.3 K/uL (ref 0.1–1.0)
Monocytes Relative: 5.7 % (ref 3.0–12.0)
Neutro Abs: 3.9 K/uL (ref 1.4–7.7)
Neutrophils Relative %: 63.1 % (ref 43.0–77.0)
Platelets: 165 K/uL (ref 150.0–400.0)
RBC: 4.66 Mil/uL (ref 3.87–5.11)
RDW: 13.2 % (ref 11.5–14.6)
WBC: 6.1 K/uL (ref 4.5–10.5)

## 2013-12-13 LAB — HEPATIC FUNCTION PANEL
ALT: 26 U/L (ref 0–35)
AST: 23 U/L (ref 0–37)
Albumin: 4 g/dL (ref 3.5–5.2)
Alkaline Phosphatase: 72 U/L (ref 39–117)
Bilirubin, Direct: 0.2 mg/dL (ref 0.0–0.3)
Total Bilirubin: 0.5 mg/dL (ref 0.3–1.2)
Total Protein: 7.4 g/dL (ref 6.0–8.3)

## 2013-12-13 LAB — BASIC METABOLIC PANEL
CO2: 25 mEq/L (ref 19–32)
Calcium: 9.2 mg/dL (ref 8.4–10.5)
Chloride: 109 mEq/L (ref 96–112)
Creatinine, Ser: 1 mg/dL (ref 0.4–1.2)
Potassium: 4.5 mEq/L (ref 3.5–5.1)
Sodium: 142 mEq/L (ref 135–145)

## 2013-12-13 LAB — LIPID PANEL
Cholesterol: 189 mg/dL (ref 0–200)
HDL: 38.7 mg/dL — ABNORMAL LOW (ref >39.00)
LDL Cholesterol: 117 mg/dL — ABNORMAL HIGH (ref 0–99)
Total CHOL/HDL Ratio: 5
Triglycerides: 166 mg/dL — ABNORMAL HIGH (ref 0.0–149.0)
VLDL: 33.2 mg/dL (ref 0.0–40.0)

## 2013-12-13 MED ORDER — NEBIVOLOL HCL 5 MG PO TABS: 5.0000 mg | ORAL_TABLET | Freq: Every day | ORAL | Status: DC

## 2013-12-13 NOTE — Assessment & Plan Note (Signed)
Chronic problem.  Typically well controlled.  UTD on eye exam.  On ACE.  Check labs.  Adjust meds prn

## 2013-12-13 NOTE — Assessment & Plan Note (Signed)
Chronic problem.  Tolerating statin w/out difficulty.  Check labs.  Adjust meds prn  

## 2013-12-13 NOTE — Progress Notes (Signed)
Pre visit review using our clinic review tool, if applicable. No additional management support is needed unless otherwise documented below in the visit note. 

## 2013-12-13 NOTE — Assessment & Plan Note (Signed)
Chronic problem.  Well controlled.  Pt would like to switch to Bystolic due to hair loss (she attributes this to metoprolol).  Pt to monitor BP at home after changing med.  Will follow.

## 2013-12-13 NOTE — Patient Instructions (Signed)
Follow up in 3-4 months to recheck diabetes We'll notify you of your lab results and make any changes if needed Call and schedule your mammogram Call with any questions or concerns Keep up the good work! Happy Holidays!!!

## 2013-12-13 NOTE — Assessment & Plan Note (Signed)
Pt's PE WNL.  UTD on health maintenance w/ exception of mammo- pt reports she will schedule.  Check labs.  Anticipatory guidance provided.

## 2013-12-13 NOTE — Progress Notes (Signed)
   Subjective:    Patient ID: Andrea Heath, female    DOB: June 21, 1946, 67 y.o.   MRN: [id number]  HPI Here today for CPE.  Risk Factors: DM- chronic problem, continues to have labile CBGs.  On Glipizide XL.  On ACE for renal protection.  UTD on eye exam, has f/u scheduled in Jan.  No numbness or tingling HTN- chronic problem, on Lisinopril and Metoprolol, well controlled today.  No CP, SOB, HAs, visual changes, edema.  Wants to switch from Metoprolol to Bystolic due to hair loss Hyperlipidemia- chronic problem, on Lipitor.  Needs refill.  No abd pain, N/V, myalgias. Physical Activity: going to [employer] regularly Fall Risk: low risk Depression: denies current sxs Hearing: normal to conversational tones and whispered voice at 6 ft ADL's: independent Cognitive: normal linear thought process, memory and attention intact Home Safety: safe at home, lives w/ husband Height, Weight, BMI, Visual Acuity: see vitals, vision corrected to 20/20 w/ glasses Counseling:  UTD on colonoscopy, DEXA, pap, due to schedule mammo. Labs Ordered: See A&P Care Plan: See A&P    Review of Systems Patient reports no vision/ hearing changes, adenopathy,fever, weight change,  persistant/recurrent hoarseness , swallowing issues, chest pain, palpitations, edema, persistant/recurrent cough, hemoptysis, dyspnea (rest/exertional/paroxysmal nocturnal), gastrointestinal bleeding (melena, rectal bleeding), abdominal pain, significant heartburn, bowel changes, GU symptoms (dysuria, hematuria, incontinence), Gyn symptoms (abnormal  bleeding, pain),  syncope, focal weakness, memory loss, numbness & tingling, skin/hair/nail changes, abnormal bruising or bleeding, anxiety, or depression.     Objective:   Physical Exam General Appearance:    Alert, cooperative, no distress, appears stated age  Head:    Normocephalic, without obvious abnormality, atraumatic  Eyes:    PERRL, conjunctiva/corneas clear, EOM's intact, fundi   benign, both eyes  Ears:    Normal TM's and external ear canals, both ears  Nose:   Nares normal, septum midline, mucosa normal, no drainage    or sinus tenderness  Throat:   Lips, mucosa, and tongue normal; teeth and gums normal  Neck:   Supple, symmetrical, trachea midline, no adenopathy;    Thyroid: no enlargement/tenderness/nodules  Back:     Symmetric, no curvature, ROM normal, no CVA tenderness  Lungs:     Clear to auscultation bilaterally, respirations unlabored  Chest Wall:    No tenderness or deformity   Heart:    Regular rate and rhythm, S1 and S2 normal, no murmur, rub   or gallop  Breast Exam:    Deferred to GYN  Abdomen:     Soft, non-tender, bowel sounds active all four quadrants,    no masses, no organomegaly  Genitalia:    Deferred to GYN  Rectal:    Extremities:   Extremities normal, atraumatic, no cyanosis or edema  Pulses:   2+ and symmetric all extremities  Skin:   Skin color, texture, turgor normal, no rashes or lesions  Lymph nodes:   Cervical, supraclavicular, and axillary nodes normal  Neurologic:   CNII-XII intact, normal strength, sensation and reflexes    throughout          Assessment & Plan:

## 2013-12-14 ENCOUNTER — Encounter: Payer: Self-pay | Admitting: General Practice

## 2013-12-14 MED ORDER — ATORVASTATIN CALCIUM 20 MG PO TABS: 20.0000 mg | ORAL_TABLET | Freq: Every day | ORAL | Status: DC

## 2013-12-16 ENCOUNTER — Encounter: Payer: Self-pay | Admitting: General Practice

## 2013-12-16 ENCOUNTER — Other Ambulatory Visit: Payer: Self-pay | Admitting: Family Medicine

## 2013-12-16 LAB — VITAMIN D 1,25 DIHYDROXY: Vitamin D3 1, 25 (OH)2: 70 pg/mL

## 2013-12-16 NOTE — Telephone Encounter (Signed)
Med filled.  

## 2013-12-21 ENCOUNTER — Telehealth: Payer: Self-pay | Admitting: *Deleted

## 2013-12-21 NOTE — Telephone Encounter (Signed)
Patient was made aware

## 2013-12-21 NOTE — Telephone Encounter (Signed)
Needs to start Immodium OTC- start w/ 2 tabs and then wait at least 4 hrs until taking the next tab.  The only med we can call in for diarrhea is stronger than immodium and is used after immodium fails

## 2013-12-21 NOTE — Telephone Encounter (Signed)
Patient called and stated that she has had diarrhea since Sunday. Patient states that he thought it was getting better but this morning it returned. Patient denies any fever, abdominal pain,nausea or vomiting. Patient states that she doesn't believe she can in to the office because she is afraid to leave the house. Patient states that she has not tried anything over the counter yet, was waiting to see if anything can be called in for her. Please advise. SW

## 2014-01-05 LAB — HM MAMMOGRAPHY: HM Mammogram: NORMAL

## 2014-01-11 ENCOUNTER — Encounter: Payer: Self-pay | Admitting: Family Medicine

## 2014-02-07 ENCOUNTER — Other Ambulatory Visit: Payer: Self-pay | Admitting: Family Medicine

## 2014-02-07 NOTE — Telephone Encounter (Signed)
Med filled.  

## 2014-03-29 ENCOUNTER — Ambulatory Visit (INDEPENDENT_AMBULATORY_CARE_PROVIDER_SITE_OTHER): Payer: Medicare Other | Admitting: Family Medicine

## 2014-03-29 ENCOUNTER — Encounter: Payer: Self-pay | Admitting: General Practice

## 2014-03-29 ENCOUNTER — Encounter: Payer: Self-pay | Admitting: Family Medicine

## 2014-03-29 VITALS — BP 122/82 | HR 58 | Temp 98.2°F | Resp 16 | Wt 161.0 lb

## 2014-03-29 DIAGNOSIS — E119 Type 2 diabetes mellitus without complications: Secondary | ICD-10-CM

## 2014-03-29 LAB — BASIC METABOLIC PANEL
BUN: 13 mg/dL (ref 6–23)
CO2: 26 mEq/L (ref 19–32)
CREATININE: 1 mg/dL (ref 0.4–1.2)
Calcium: 9.5 mg/dL (ref 8.4–10.5)
Chloride: 105 mEq/L (ref 96–112)
GFR: 61.23 mL/min (ref >60.00)
GLUCOSE: 144 mg/dL — AB (ref 70–99)
POTASSIUM: 4.5 meq/L (ref 3.5–5.1)
Sodium: 141 mEq/L (ref 135–145)

## 2014-03-29 LAB — HM DIABETES FOOT EXAM: HM Diabetic Foot Exam: NORMAL

## 2014-03-29 LAB — HEMOGLOBIN A1C: Hgb A1c MFr Bld: 6.4 % (ref 4.6–6.5)

## 2014-03-29 NOTE — Assessment & Plan Note (Signed)
Pt is compliant w/ meds, asymptomatic, typically well controlled.  UTD on eye exam.  On ACE for renal protection.  Check labs.  Adjust meds prn

## 2014-03-29 NOTE — Patient Instructions (Signed)
Follow up in 3-4 months to recheck diabetes and cholesterol Keep up the good work!  You look great! We'll notify you of your lab results and make any changes if needed Call with any questions or concerns Happy Andrea Heath!!!

## 2014-03-29 NOTE — Progress Notes (Signed)
   Subjective:    Patient ID: Andrea Heath, female    DOB: 01/02/1946, 68 y.o.   MRN: [id number]  HPI DM- chronic problem, typically well controlled.  UTD on eye exam.  On ACE for renal protection.  On Glipizide.  Denies numbness/tingling of hands/feet.  No CP, SOB, HAs, edema, N/V/D.    Review of Systems For ROS see HPI     Objective:   Physical Exam  Vitals reviewed. Constitutional: She is oriented to person, place, and time. She appears well-developed and well-nourished. No distress.  HENT:  Head: Normocephalic and atraumatic.  Eyes: Conjunctivae and EOM are normal. Pupils are equal, round, and reactive to light.  Neck: Normal range of motion. Neck supple. No thyromegaly present.  Cardiovascular: Normal rate, regular rhythm, normal heart sounds and intact distal pulses.   No murmur heard. Pulmonary/Chest: Effort normal and breath sounds normal. No respiratory distress.  Abdominal: Soft. She exhibits no distension. There is no tenderness.  Musculoskeletal: She exhibits no edema.  Lymphadenopathy:    She has no cervical adenopathy.  Neurological: She is alert and oriented to person, place, and time.  Skin: Skin is warm and dry.  Psychiatric: She has a normal mood and affect. Her behavior is normal.          Assessment & Plan:

## 2014-03-29 NOTE — Progress Notes (Signed)
Pre visit review using our clinic review tool, if applicable. No additional management support is needed unless otherwise documented below in the visit note. 

## 2014-04-22 ENCOUNTER — Telehealth: Payer: Self-pay

## 2014-04-22 NOTE — Telephone Encounter (Signed)
Relevant patient education mailed to patient.  

## 2014-05-30 LAB — HM DIABETES EYE EXAM

## 2014-06-21 ENCOUNTER — Encounter: Payer: Self-pay | Admitting: Family Medicine

## 2014-06-21 ENCOUNTER — Encounter: Payer: Self-pay | Admitting: General Practice

## 2014-06-21 ENCOUNTER — Ambulatory Visit (INDEPENDENT_AMBULATORY_CARE_PROVIDER_SITE_OTHER): Payer: Medicare Other | Admitting: Family Medicine

## 2014-06-21 VITALS — BP 118/78 | HR 78 | Temp 98.2°F | Resp 16 | Wt 164.4 lb

## 2014-06-21 DIAGNOSIS — E119 Type 2 diabetes mellitus without complications: Secondary | ICD-10-CM

## 2014-06-21 DIAGNOSIS — I10 Essential (primary) hypertension: Secondary | ICD-10-CM

## 2014-06-21 DIAGNOSIS — E785 Hyperlipidemia, unspecified: Secondary | ICD-10-CM

## 2014-06-21 LAB — LIPID PANEL
CHOL/HDL RATIO: 4
Cholesterol: 162 mg/dL (ref 0–200)
HDL: 40.3 mg/dL (ref >39.00)
LDL Cholesterol: 84 mg/dL (ref 0–99)
NonHDL: 121.7
TRIGLYCERIDES: 189 mg/dL — AB (ref 0.0–149.0)
VLDL: 37.8 mg/dL (ref 0.0–40.0)

## 2014-06-21 LAB — BASIC METABOLIC PANEL
BUN: 13 mg/dL (ref 6–23)
CHLORIDE: 108 meq/L (ref 96–112)
CO2: 24 mEq/L (ref 19–32)
Calcium: 9.1 mg/dL (ref 8.4–10.5)
Creatinine, Ser: 1 mg/dL (ref 0.4–1.2)
GFR: 57.06 mL/min — ABNORMAL LOW (ref >60.00)
Glucose, Bld: 172 mg/dL — ABNORMAL HIGH (ref 70–99)
Potassium: 4.2 mEq/L (ref 3.5–5.1)
Sodium: 143 mEq/L (ref 135–145)

## 2014-06-21 LAB — HEPATIC FUNCTION PANEL
ALBUMIN: 4.1 g/dL (ref 3.5–5.2)
ALT: 24 U/L (ref 0–35)
AST: 23 U/L (ref 0–37)
Alkaline Phosphatase: 73 U/L (ref 39–117)
Bilirubin, Direct: 0.1 mg/dL (ref 0.0–0.3)
Total Bilirubin: 0.7 mg/dL (ref 0.2–1.2)
Total Protein: 7.4 g/dL (ref 6.0–8.3)

## 2014-06-21 LAB — HEMOGLOBIN A1C: Hgb A1c MFr Bld: 6.9 % — ABNORMAL HIGH (ref 4.6–6.5)

## 2014-06-21 LAB — TSH: TSH: 4.95 u[IU]/mL — ABNORMAL HIGH (ref 0.35–4.50)

## 2014-06-21 NOTE — Assessment & Plan Note (Signed)
Chronic problem.  Typically well controlled.  UTD on eye exam.  Asymptomatic.  Check labs.  Adjust meds prn

## 2014-06-21 NOTE — Progress Notes (Signed)
Pre visit review using our clinic review tool, if applicable. No additional management support is needed unless otherwise documented below in the visit note. 

## 2014-06-21 NOTE — Assessment & Plan Note (Signed)
Chronic problem.  Excellent control today.  Pt would like to stop Bystolic due to cost and side effect of hair loss.  Will stop med and have pt monitor her BP at home.  If readings are consistently >140/90 will double lisinopril.  Pt expressed understanding and is in agreement w/ plan.

## 2014-06-21 NOTE — Progress Notes (Signed)
   Subjective:    Patient ID: Andrea Heath, female    DOB: 09/19/1946, 68 y.o.   MRN: [id number]  HPI HTN- chronic problem, on Bystolic, Lisinopril.  Denies CP, SOB, HAs, visual changes, edema.  Hyperlipidemia- chronic problem, on Lipitor.  Denies abd pain, N/V, myalgias.  DM- chronic problem, on Glipizide daily.  UTD on eye exam.  Denies symptomatic lows, numbness/tingling hands/feet.  Home CBGs 'up and down'.   Review of Systems For ROS see HPI     Objective:   Physical Exam  Vitals reviewed. Constitutional: She is oriented to person, place, and time. She appears well-developed and well-nourished. No distress.  HENT:  Head: Normocephalic and atraumatic.  Eyes: Conjunctivae and EOM are normal. Pupils are equal, round, and reactive to light.  Neck: Normal range of motion. Neck supple. No thyromegaly present.  Cardiovascular: Normal rate, regular rhythm, normal heart sounds and intact distal pulses.   No murmur heard. Pulmonary/Chest: Effort normal and breath sounds normal. No respiratory distress.  Abdominal: Soft. She exhibits no distension. There is no tenderness.  Musculoskeletal: She exhibits no edema.  Lymphadenopathy:    She has no cervical adenopathy.  Neurological: She is alert and oriented to person, place, and time.  Skin: Skin is warm and dry.  Psychiatric: She has a normal mood and affect. Her behavior is normal.          Assessment & Plan:

## 2014-06-21 NOTE — Patient Instructions (Signed)
Schedule a diabetes visit in 3-4 months STOP the Bystolic daily Check your BP at home and if consistently higher than 140/90, call me and we'll double the Lisinopril Keep up the good work!  You look great! We'll notify you of your lab results and make any changes if needed Call with any questions or concerns Have a great summer!

## 2014-06-21 NOTE — Assessment & Plan Note (Signed)
Chronic problem.  Tolerating statin w/o difficulty.  Check labs.  Adjust meds prn  

## 2014-06-22 ENCOUNTER — Other Ambulatory Visit: Payer: Self-pay | Admitting: General Practice

## 2014-06-22 ENCOUNTER — Telehealth: Payer: Self-pay | Admitting: Family Medicine

## 2014-06-22 MED ORDER — LEVOTHYROXINE SODIUM 75 MCG PO TABS: 75.0000 ug | ORAL_TABLET | Freq: Every day | ORAL | Status: DC

## 2014-06-22 NOTE — Telephone Encounter (Signed)
Relevant patient education assigned to patient using Emmi. ° °

## 2014-09-27 ENCOUNTER — Ambulatory Visit (INDEPENDENT_AMBULATORY_CARE_PROVIDER_SITE_OTHER): Payer: Medicare Other | Admitting: Family Medicine

## 2014-09-27 ENCOUNTER — Encounter: Payer: Self-pay | Admitting: General Practice

## 2014-09-27 ENCOUNTER — Encounter: Payer: Self-pay | Admitting: Family Medicine

## 2014-09-27 VITALS — BP 118/74 | HR 95 | Temp 98.1°F | Resp 16 | Wt 177.5 lb

## 2014-09-27 DIAGNOSIS — Z23 Encounter for immunization: Secondary | ICD-10-CM

## 2014-09-27 DIAGNOSIS — E119 Type 2 diabetes mellitus without complications: Secondary | ICD-10-CM

## 2014-09-27 LAB — BASIC METABOLIC PANEL
BUN: 10 mg/dL (ref 6–23)
CO2: 27 mEq/L (ref 19–32)
Calcium: 9 mg/dL (ref 8.4–10.5)
Chloride: 107 mEq/L (ref 96–112)
Creatinine, Ser: 1 mg/dL (ref 0.4–1.2)
GFR: 57.67 mL/min — ABNORMAL LOW (ref >60.00)
Glucose, Bld: 149 mg/dL — ABNORMAL HIGH (ref 70–99)
Potassium: 4.1 mEq/L (ref 3.5–5.1)
Sodium: 140 mEq/L (ref 135–145)

## 2014-09-27 LAB — HEMOGLOBIN A1C: Hgb A1c MFr Bld: 6.8 % — ABNORMAL HIGH (ref 4.6–6.5)

## 2014-09-27 NOTE — Assessment & Plan Note (Signed)
Chronic problem.  Typically well controlled.  On ACE for renal protection.  UTD on eye exam.  Asymptomatic.  Check labs.  Adjust meds prn.

## 2014-09-27 NOTE — Patient Instructions (Signed)
Schedule your complete physical in 3 months We'll notify you of your lab results and make any changes if needed Keep up the good work!  You look great! Call with any questions or concerns Happy Fall!!!

## 2014-09-27 NOTE — Progress Notes (Signed)
Pre visit review using our clinic review tool, if applicable. No additional management support is needed unless otherwise documented below in the visit note. 

## 2014-09-27 NOTE — Progress Notes (Signed)
   Subjective:    Patient ID: Andrea Heath, female    DOB: 03/21/1946, 68 y.o.   MRN: [id number]  HPI DM- chronic problem, on Glipizide.  On ACE for renal protection.  UTD on eye exam.  Denies symptomatic lows.  No CP, SOB, HAs, visual changes, edema, N/V/D.  No numbness/tingling of hands/feet.   Review of Systems For ROS see HPI     Objective:   Physical Exam  Vitals reviewed. Constitutional: She is oriented to person, place, and time. She appears well-developed and well-nourished. No distress.  HENT:  Head: Normocephalic and atraumatic.  Eyes: Conjunctivae and EOM are normal. Pupils are equal, round, and reactive to light.  Neck: Normal range of motion. Neck supple. No thyromegaly present.  Cardiovascular: Normal rate, regular rhythm, normal heart sounds and intact distal pulses.   No murmur heard. Pulmonary/Chest: Effort normal and breath sounds normal. No respiratory distress.  Abdominal: Soft. She exhibits no distension. There is no tenderness.  Musculoskeletal: She exhibits no edema.  Lymphadenopathy:    She has no cervical adenopathy.  Neurological: She is alert and oriented to person, place, and time.  Skin: Skin is warm and dry.  Psychiatric: She has a normal mood and affect. Her behavior is normal.          Assessment & Plan:

## 2014-11-05 ENCOUNTER — Other Ambulatory Visit: Payer: Self-pay | Admitting: Family Medicine

## 2014-11-07 NOTE — Telephone Encounter (Signed)
Med filled.  

## 2014-11-12 ENCOUNTER — Other Ambulatory Visit: Payer: Self-pay | Admitting: Family Medicine

## 2014-11-14 NOTE — Telephone Encounter (Signed)
Med filled.  

## 2014-11-21 ENCOUNTER — Telehealth: Payer: Self-pay | Admitting: Family Medicine

## 2014-11-21 NOTE — Telephone Encounter (Signed)
Caller name: Ester Relation to pt: self Call back number: [phone] Pharmacy: [facility]  Reason for call:   Patient saw her orthopedic dr today and he recommends that patient start norvasc for the winter regarding raynards.

## 2014-11-21 NOTE — Telephone Encounter (Signed)
Please call pt

## 2014-11-21 NOTE — Telephone Encounter (Signed)
I understand that the Norvasc will help the Raynaud's but the Lisinopril is protecting the kidneys from diabetes.  Do they want to use Norvasc in addition to the Lisinopril?  We could possibly lower the dose on Lisinopril and add Norvasc

## 2014-11-22 MED ORDER — AMLODIPINE BESYLATE 5 MG PO TABS: 5.0000 mg | ORAL_TABLET | Freq: Every day | ORAL | Status: DC

## 2014-11-22 MED ORDER — LISINOPRIL 5 MG PO TABS: 5.0000 mg | ORAL_TABLET | Freq: Every day | ORAL | Status: DC

## 2014-11-22 NOTE — Telephone Encounter (Signed)
Pt notified and meds filled. Filled for 90 days at pt request.

## 2014-11-22 NOTE — Telephone Encounter (Signed)
Pt states they do want her to be on both to assist with both situations.  Pt also wants to know if she starts Norvasc during the winter months, will she be taken off during the summer months?   Please advise.

## 2014-11-22 NOTE — Telephone Encounter (Signed)
Ok to decrease Lisinopril to 5mg  daily and add Norvasc 5mg  daily.  #30 of each, 6 refills. The Norvasc would continue through the summer to avoid multiple med switches throughout the year.

## 2014-12-05 ENCOUNTER — Telehealth: Payer: Self-pay | Admitting: Family Medicine

## 2014-12-05 MED ORDER — GLUCOSE BLOOD VI STRP: ORAL_STRIP | Status: DC

## 2014-12-05 NOTE — Telephone Encounter (Signed)
Med filled.  

## 2014-12-05 NOTE — Telephone Encounter (Signed)
Caller name: Attie Relation to pt: self Call back number: [phone] Pharmacy: [facility] on [address]  Reason for call:   Patient requesting a refill on one touch test strips

## 2014-12-06 MED ORDER — GLUCOSE BLOOD VI STRP: ORAL_STRIP | Status: DC

## 2014-12-06 NOTE — Telephone Encounter (Signed)
Resent with ICD-10 code

## 2014-12-06 NOTE — Telephone Encounter (Signed)
Pharmacy states they need the ICD-10 code in order to fill the rx

## 2014-12-06 NOTE — Addendum Note (Signed)
Addended by: Jackson LatinoYLER, JESSICA L on: 12/06/2014 11:04 AM   Modules accepted: Orders

## 2014-12-09 ENCOUNTER — Ambulatory Visit (INDEPENDENT_AMBULATORY_CARE_PROVIDER_SITE_OTHER): Payer: Medicare Other | Admitting: Physician Assistant

## 2014-12-09 ENCOUNTER — Encounter: Payer: Self-pay | Admitting: Physician Assistant

## 2014-12-09 VITALS — BP 143/80 | HR 91 | Temp 98.1°F | Wt 172.0 lb

## 2014-12-09 DIAGNOSIS — R252 Cramp and spasm: Secondary | ICD-10-CM

## 2014-12-09 NOTE — Patient Instructions (Signed)
Please increase fluid intake and intake of foods high in potassium.  See information below. Apply topical Asprecreme to the lower legs. I will call you with your lab results.  We will treat based on those results.    Potassium Content of Foods Potassium is a mineral found in many foods and drinks. It helps keep fluids and minerals balanced in your body and affects how steadily your heart beats. Potassium also helps control your blood pressure and keep your muscles and nervous system healthy. Certain health conditions and medicines may change the balance of potassium in your body. When this happens, you can help balance your level of potassium through the foods that you do or do not eat. Your health care provider or dietitian may recommend an amount of potassium that you should have each day. The following lists of foods provide the amount of potassium (in parentheses) per serving in each item. HIGH IN POTASSIUM  The following foods and beverages have 200 mg or more of potassium per serving:  Apricots, 2 raw or 5 dry (200 mg).  Artichoke, 1 medium (345 mg).  Avocado, raw,  each (245 mg).  Banana, 1 medium (425 mg).  Beans, lima, or baked beans, canned,  cup (280 mg).  Beans, white, canned,  cup (595 mg).  Beef roast, 3 oz (320 mg).  Beef, ground, 3 oz (270 mg).  Beets, raw or cooked,  cup (260 mg).  Bran muffin, 2 oz (300 mg).  Broccoli,  cup (230 mg).  Brussels sprouts,  cup (250 mg).  Cantaloupe,  cup (215 mg).  Cereal, 100% bran,  cup (200-400 mg).  Cheeseburger, single, fast food, 1 each (225-400 mg).  Chicken, 3 oz (220 mg).  Clams, canned, 3 oz (535 mg).  Crab, 3 oz (225 mg).  Dates, 5 each (270 mg).  Dried beans and peas,  cup (300-475 mg).  Figs, dried, 2 each (260 mg).  Fish: halibut, tuna, cod, snapper, 3 oz (480 mg).  Fish: salmon, haddock, swordfish, perch, 3 oz (300 mg).  Fish, tuna, canned 3 oz (200 mg).  Jamaica[place] fries, fast food, 3 oz  (470 mg).  Granola with fruit and nuts,  cup (200 mg).  Grapefruit juice,  cup (200 mg).  Greens, beet,  cup (655 mg).  Honeydew melon,  cup (200 mg).  Kale, raw, 1 cup (300 mg).  Kiwi, 1 medium (240 mg).  Kohlrabi, rutabaga, parsnips,  cup (280 mg).  Lentils,  cup (365 mg).  Mango, 1 each (325 mg).  Milk, chocolate, 1 cup (420 mg).  Milk: nonfat, low-fat, whole, buttermilk, 1 cup (350-380 mg).  Molasses, 1 Tbsp (295 mg).  Mushrooms,  cup (280) mg.  Nectarine, 1 each (275 mg).  Nuts: almonds, peanuts, hazelnuts, [place], cashew, mixed, 1 oz (200 mg).  Nuts, pistachios, 1 oz (295 mg).  Orange, 1 each (240 mg).  Orange juice,  cup (235 mg).  Papaya, medium,  fruit (390 mg).  Peanut butter, chunky, 2 Tbsp (240 mg).  Peanut butter, smooth, 2 Tbsp (210 mg).  Pear, 1 medium (200 mg).  Pomegranate, 1 whole (400 mg).  Pomegranate juice,  cup (215 mg).  Pork, 3 oz (350 mg).  Potato chips, salted, 1 oz (465 mg).  Potato, baked with skin, 1 medium (925 mg).  Potatoes, boiled,  cup (255 mg).  Potatoes, mashed,  cup (330 mg).  Prune juice,  cup (370 mg).  Prunes, 5 each (305 mg).  Pudding, chocolate,  cup (230 mg).  Pumpkin, canned,  cup (250 mg).  Raisins, seedless,  cup (270 mg).  Seeds, sunflower or pumpkin, 1 oz (240 mg).  Soy milk, 1 cup (300 mg).  Spinach,  cup (420 mg).  Spinach, canned,  cup (370 mg).  Sweet potato, baked with skin, 1 medium (450 mg).  Swiss chard,  cup (480 mg).  Tomato or vegetable juice,  cup (275 mg).  Tomato sauce or puree,  cup (400-550 mg).  Tomato, raw, 1 medium (290 mg).  Tomatoes, canned,  cup (200-300 mg).  [place]key, 3 oz (250 mg).  Wheat germ, 1 oz (250 mg).  Winter squash,  cup (250 mg).  Yogurt, plain or fruited, 6 oz (260-435 mg).  Zucchini,  cup (220 mg). MODERATE IN POTASSIUM The following foods and beverages have 50-200 mg of potassium per serving:  Apple, 1 each  (150 mg).  Apple juice,  cup (150 mg).  Applesauce,  cup (90 mg).  Apricot nectar,  cup (140 mg).  Asparagus, small spears,  cup or 6 spears (155 mg).  Bagel, cinnamon raisin, 1 each (130 mg).  Bagel, egg or plain, 4 in., 1 each (70 mg).  Beans, green,  cup (90 mg).  Beans, yellow,  cup (190 mg).  Beer, regular, 12 oz (100 mg).  Beets, canned,  cup (125 mg).  Blackberries,  cup (115 mg).  Blueberries,  cup (60 mg).  Bread, whole wheat, 1 slice (70 mg).  Broccoli, raw,  cup (145 mg).  Cabbage,  cup (150 mg).  Carrots, cooked or raw,  cup (180 mg).  Cauliflower, raw,  cup (150 mg).  Celery, raw,  cup (155 mg).  Cereal, bran flakes, cup (120-150 mg).  Cheese, cottage,  cup (110 mg).  Cherries, 10 each (150 mg).  Chocolate, 1 oz bar (165 mg).  Coffee, brewed 6 oz (90 mg).  Corn,  cup or 1 ear (195 mg).  Cucumbers,  cup (80 mg).  Egg, large, 1 each (60 mg).  Eggplant,  cup (60 mg).  Endive, raw, cup (80 mg).  English muffin, 1 each (65 mg).  Fish, orange roughy, 3 oz (150 mg).  Frankfurter, beef or pork, 1 each (75 mg).  Fruit cocktail,  cup (115 mg).  Grape juice,  cup (170 mg).  Grapefruit,  fruit (175 mg).  Grapes,  cup (155 mg).  Greens: kale, turnip, collard,  cup (110-150 mg).  Ice cream or frozen yogurt, chocolate,  cup (175 mg).  Ice cream or frozen yogurt, vanilla,  cup (120-150 mg).  Lemons, limes, 1 each (80 mg).  Lettuce, all types, 1 cup (100 mg).  Mixed vegetables,  cup (150 mg).  Mushrooms, raw,  cup (110 mg).  Nuts: walnuts, pecans, or macadamia, 1 oz (125 mg).  Oatmeal,  cup (80 mg).  Okra,  cup (110 mg).  Onions, raw,  cup (120 mg).  Peach, 1 each (185 mg).  Peaches, canned,  cup (120 mg).  Pears, canned,  cup (120 mg).  Peas, green, frozen,  cup (90 mg).  Peppers, green,  cup (130 mg).  Peppers, red,  cup (160 mg).  Pineapple juice,  cup (165  mg).  Pineapple, fresh or canned,  cup (100 mg).  Plums, 1 each (105 mg).  Pudding, vanilla,  cup (150 mg).  Raspberries,  cup (90 mg).  Rhubarb,  cup (115 mg).  Rice, wild,  cup (80 mg).  Shrimp, 3 oz (155 mg).  Spinach, raw, 1 cup (170 mg).  Strawberries,  cup (125 mg).  Summer squash  cup (175-200 mg).  Swiss chard, raw, 1 cup (135 mg).  Tangerines, 1 each (140 mg).  Tea, brewed, 6 oz (65 mg).  Turnips,  cup (140 mg).  Watermelon,  cup (85 mg).  Wine, red, table, 5 oz (180 mg).  Wine, white, table, 5 oz (100 mg). LOW IN POTASSIUM The following foods and beverages have less than 50 mg of potassium per serving.  Bread, white, 1 slice (30 mg).  Carbonated beverages, 12 oz (less than 5 mg).  Cheese, 1 oz (20-30 mg).  Cranberries,  cup (45 mg).  Cranberry juice cocktail,  cup (20 mg).  Fats and oils, 1 Tbsp (less than 5 mg).  Hummus, 1 Tbsp (32 mg).  Nectar: papaya, mango, or pear,  cup (35 mg).  Rice, white or brown,  cup (50 mg).  Spaghetti or macaroni,  cup cooked (30 mg).  Tortilla, flour or corn, 1 each (50 mg).  Waffle, 4 in., 1 each (50 mg).  Water chestnuts,  cup (40 mg). Document Released: 07/30/2005 Document Revised: 12/21/2013 Document Reviewed: 11/12/2013 [facility] Patient Information 2015 [place], [place]. This information is not intended to replace advice given to you by your health care provider. Make sure you discuss any questions you have with your health care provider.

## 2014-12-09 NOTE — Progress Notes (Signed)
Patient presents to clinic today c/o bilateral lower leg cramping intermittently that began last night.  Patient endorses she recently recovered from a "stomach bug".  Was having several episodes of non-bloody emesis and loose stools.  States those symptoms have resolved for > 28 hours, but now the cramps have developed.  Denies change to diet. Has increased intake of bananas.  Denies hx of hypokalemia.  Past Medical History  Diagnosis Date  . Diabetes mellitus   . Hyperlipidemia   . Hypertension   . Keratosis seborrheica   . Rhinitis   . Scleroderma   . Macular degeneration of both eyes     Current Outpatient Prescriptions on File Prior to Visit  Medication Sig Dispense Refill  . amLODipine (NORVASC) 5 MG tablet Take 1 tablet (5 mg total) by mouth daily. 90 tablet 1  . aspirin (BAYER ASPIRIN) 325 MG tablet Take 325 mg by mouth daily.      Marland Kitchen. atorvastatin (LIPITOR) 20 MG tablet TAKE 1 TABLET (20 MG TOTAL) BY MOUTH DAILY. 90 tablet 3  . BESIVANCE 0.6 % SUSP     . Biotin 10 MG TABS Take by mouth.    . cholecalciferol (VITAMIN D) 1000 UNITS tablet Take 1,000 Units by mouth daily.      . DUREZOL 0.05 % EMUL     . fluticasone (FLONASE) 50 MCG/ACT nasal spray USE 2 SPRAYS IN EACH NOSTRIL ONCE A DAY 16 g 2  . glipiZIDE (GLUCOTROL XL) 5 MG 24 hr tablet TAKE 1 TABLET BY MOUTH ONCE DAILY 90 tablet 3  . glucose blood (ONE TOUCH ULTRA TEST) test strip USE ONE TEST STRIP  EVERY DAY. DX. E11.9 100 each 6  . ILEVRO 0.3 % SUSP     . levothyroxine (SYNTHROID, LEVOTHROID) 75 MCG tablet Take 1 tablet (75 mcg total) by mouth daily. 90 tablet 1  . lisinopril (PRINIVIL,ZESTRIL) 5 MG tablet Take 1 tablet (5 mg total) by mouth daily. 90 tablet 1  . Lutein 20 MG CAPS Take 1 capsule by mouth daily.     No current facility-administered medications on file prior to visit.    No Known Allergies  Family History  Problem Relation Age of Onset  . Stroke Mother   . Heart attack Mother   . Cancer Mother    breast cancer  . Lung cancer Father   . Achalasia Brother   . Arrhythmia Brother   . Arrhythmia Brother     History   Social History  . Marital Status: Married    Spouse Name: N/A    Number of Children: N/A  . Years of Education: N/A   Social History Main Topics  . Smoking status: Never Smoker   . Smokeless tobacco: None  . Alcohol Use: No  . Drug Use: No  . Sexual Activity: None   Other Topics Concern  . None   Social History Narrative   Review of Systems - See HPI.  All other ROS are negative.  BP 143/80 mmHg  Pulse 91  Temp(Src) 98.1 F (36.7 C)  Wt 172 lb (78.019 kg)  SpO2 98%  Physical Exam  Constitutional: She is oriented to person, place, and time and well-developed, well-nourished, and in no distress.  HENT:  Head: Normocephalic and atraumatic.  Eyes: Conjunctivae are normal.  Neck: Neck supple.  Cardiovascular: Normal rate, regular rhythm, normal heart sounds and intact distal pulses.   Pulmonary/Chest: Effort normal and breath sounds normal. No respiratory distress. She has no wheezes. She has  no rales. She exhibits no tenderness.  Musculoskeletal: Normal range of motion.  Neurological: She is alert and oriented to person, place, and time.  Skin: Skin is warm and dry. No rash noted.  Psychiatric: Affect normal.  Vitals reviewed.  Recent Results (from the past 2160 hour(s))  Hemoglobin A1c     Status: Abnormal   Collection Time: 09/27/14  8:56 AM  Result Value Ref Range   Hgb A1c MFr Bld 6.8 (H) 4.6 - 6.5 %    Comment: Glycemic Control Guidelines for People with Diabetes:Non Diabetic:  <6%Goal of Therapy: <7%Additional Action Suggested:  >8%   Basic metabolic panel     Status: Abnormal   Collection Time: 09/27/14  8:56 AM  Result Value Ref Range   Sodium 140 135 - 145 mEq/L   Potassium 4.1 3.5 - 5.1 mEq/L   Chloride 107 96 - 112 mEq/L   CO2 27 19 - 32 mEq/L   Glucose, Bld 149 (H) 70 - 99 mg/dL   BUN 10 6 - 23 mg/dL   Creatinine, Ser 1.0 0.4 -  1.2 mg/dL   Calcium 9.0 8.4 - 09.810.5 mg/dL   GFR 11.9157.67 (L) >47.82>60.00 mL/min  CBC     Status: None   Collection Time: 12/09/14  4:17 PM  Result Value Ref Range   WBC CANCELED 4.0 - 10.5 K/uL    Comment: Specimen clotted, unable to perform test(s).  Result canceled by the ancillary    RBC CANCELED 3.87 - 5.11 MIL/uL    Comment: Result canceled by the ancillary   Hemoglobin CANCELED 12.0 - 15.0 g/dL    Comment: Result canceled by the ancillary   HCT CANCELED 36.0 - 46.0 %    Comment: Result canceled by the ancillary   MCV CANCELED 78.0 - 100.0 fL    Comment: Result canceled by the ancillary   MCH CANCELED 26.0 - 34.0 pg    Comment: Result canceled by the ancillary   MCHC CANCELED 30.0 - 36.0 g/dL    Comment: Result canceled by the ancillary   RDW CANCELED 11.5 - 15.5 %    Comment: Result canceled by the ancillary   Platelets CANCELED 150 - 400 K/uL    Comment: Result canceled by the ancillary   MPV CANCELED 9.4 - 12.4 fL    Comment: Result canceled by the ancillary  Basic Metabolic Panel (BMET)     Status: Abnormal   Collection Time: 12/09/14  4:17 PM  Result Value Ref Range   Sodium 140 135 - 145 mEq/L   Potassium 4.0 3.5 - 5.3 mEq/L   Chloride 106 96 - 112 mEq/L   CO2 23 19 - 32 mEq/L   Glucose, Bld 166 (H) 70 - 99 mg/dL   BUN 10 6 - 23 mg/dL   Creat 9.560.87 2.130.50 - 0.861.10 mg/dL   Calcium 9.4 8.4 - 57.810.5 mg/dL   Assessment/Plan: Leg cramp Examination unremarkable. Suspect mild cramping due to depleted cellular stores of potassium giving recent gastroenteritis.  Supportive measures discussed. Increase dietary intake of potassium-rich foods.  Will check CBC and BMP.  Follow-up 1 week.

## 2014-12-09 NOTE — Progress Notes (Signed)
Pre visit review using our clinic review tool, if applicable. No additional management support is needed unless otherwise documented below in the visit note. 

## 2014-12-10 ENCOUNTER — Other Ambulatory Visit: Payer: Self-pay | Admitting: Family Medicine

## 2014-12-10 LAB — BASIC METABOLIC PANEL
BUN: 10 mg/dL (ref 6–23)
CO2: 23 mEq/L (ref 19–32)
Calcium: 9.4 mg/dL (ref 8.4–10.5)
Chloride: 106 mEq/L (ref 96–112)
Creat: 0.87 mg/dL (ref 0.50–1.10)
GLUCOSE: 166 mg/dL — AB (ref 70–99)
Potassium: 4 mEq/L (ref 3.5–5.3)
Sodium: 140 mEq/L (ref 135–145)

## 2014-12-10 LAB — CBC

## 2014-12-12 DIAGNOSIS — R252 Cramp and spasm: Secondary | ICD-10-CM | POA: Insufficient documentation

## 2014-12-12 NOTE — Telephone Encounter (Signed)
Med filled.  

## 2014-12-12 NOTE — Assessment & Plan Note (Signed)
Examination unremarkable. Suspect mild cramping due to depleted cellular stores of potassium giving recent gastroenteritis.  Supportive measures discussed. Increase dietary intake of potassium-rich foods.  Will check CBC and BMP.  Follow-up 1 week.

## 2014-12-24 ENCOUNTER — Other Ambulatory Visit: Payer: Self-pay | Admitting: Family Medicine

## 2014-12-26 NOTE — Telephone Encounter (Signed)
Rx request to pharmacy/[facility]  

## 2014-12-28 ENCOUNTER — Telehealth: Payer: Self-pay | Admitting: Family Medicine

## 2014-12-28 DIAGNOSIS — Z1231 Encounter for screening mammogram for malignant neoplasm of breast: Secondary | ICD-10-CM

## 2014-12-28 NOTE — Telephone Encounter (Signed)
Just specify 3D mammo in comments when ordered

## 2014-12-28 NOTE — Telephone Encounter (Signed)
Pt has an appt tomorrow will ask where she gets mammo completed at.

## 2014-12-28 NOTE — Telephone Encounter (Signed)
Is there a certain way I need to order this?

## 2014-12-28 NOTE — Telephone Encounter (Signed)
Caller name: Andrea Heath Relation to pt: self Call back number: [phone] Pharmacy:  Reason for call:   Patient states that she is due for her mammogram in January and would like a 3D mammogram and will need referral for this because of her ins. Ok to leave detailed message

## 2014-12-29 NOTE — Telephone Encounter (Signed)
Referral placed.

## 2015-01-06 ENCOUNTER — Telehealth: Payer: Self-pay | Admitting: Family Medicine

## 2015-01-06 NOTE — Telephone Encounter (Signed)
Can we print the previous order and fax?

## 2015-01-06 NOTE — Telephone Encounter (Signed)
Caller name: Nirel Relation to pt: self Call back number: [phone] Pharmacy:  Reason for call:   Patient calling in stating that she has a order [facility] a mammogram to [facility] but would like to go to [facility] instead.

## 2015-01-06 NOTE — Telephone Encounter (Signed)
Order faxed to [facility]. 

## 2015-01-06 NOTE — Telephone Encounter (Signed)
Error

## 2015-01-20 ENCOUNTER — Encounter: Payer: BC Managed Care – PPO | Admitting: Family Medicine

## 2015-01-23 ENCOUNTER — Encounter: Payer: Self-pay | Admitting: Family Medicine

## 2015-01-24 ENCOUNTER — Other Ambulatory Visit: Payer: Self-pay | Admitting: Family Medicine

## 2015-01-24 NOTE — Telephone Encounter (Signed)
Medication filled.  

## 2015-01-25 ENCOUNTER — Telehealth: Payer: Self-pay | Admitting: Family Medicine

## 2015-01-25 NOTE — Telephone Encounter (Signed)
Dismissal Letter sent by Certified Mail 01/25/2015  Received the Return Receipt showing someone picked up the Dismissal 02/06/2015

## 2015-01-26 ENCOUNTER — Encounter: Payer: BC Managed Care – PPO | Admitting: Family Medicine

## 2015-03-17 ENCOUNTER — Encounter: Payer: BC Managed Care – PPO | Admitting: Family Medicine

## 2015-03-28 ENCOUNTER — Telehealth: Payer: Self-pay | Admitting: *Deleted

## 2015-03-28 NOTE — Telephone Encounter (Signed)
Medical record request received via fax from [facility]. Forwarded to [facility] to scan/email to medical records. JG//CMA  

## 2015-04-26 ENCOUNTER — Other Ambulatory Visit: Payer: Self-pay | Admitting: Family Medicine

## 2015-04-26 NOTE — Telephone Encounter (Signed)
Med denied, pt dismissed from practice.  

## 2015-06-06 ENCOUNTER — Telehealth (HOSPITAL_COMMUNITY): Payer: Self-pay | Admitting: *Deleted

## 2015-06-06 ENCOUNTER — Telehealth (HOSPITAL_COMMUNITY): Payer: Self-pay | Admitting: Vascular Surgery

## 2015-06-06 DIAGNOSIS — M349 Systemic sclerosis, unspecified: Secondary | ICD-10-CM

## 2015-06-06 NOTE — Telephone Encounter (Signed)
New pt referred to Dr Gala RomneyBensimhon for scleroderma per Dr Corliss Skainseveshwar, per Dr Gala RomneyBensimhon pt will need echo and pfts prior to appt, orders placed will sch test

## 2015-06-06 NOTE — Telephone Encounter (Signed)
Left pt a message about pft appt 6/21 @ 9

## 2015-06-12 ENCOUNTER — Other Ambulatory Visit: Payer: Self-pay | Admitting: Rheumatology

## 2015-06-12 DIAGNOSIS — E2839 Other primary ovarian failure: Secondary | ICD-10-CM

## 2015-06-15 ENCOUNTER — Other Ambulatory Visit: Payer: Self-pay | Admitting: Family Medicine

## 2015-06-15 NOTE — Telephone Encounter (Signed)
Pt has been dismissed from practice.

## 2015-06-19 ENCOUNTER — Other Ambulatory Visit: Payer: Self-pay | Admitting: Family Medicine

## 2015-06-19 NOTE — Telephone Encounter (Signed)
Med denied, pt no longer under prescriber care.

## 2015-06-20 ENCOUNTER — Ambulatory Visit (HOSPITAL_COMMUNITY)
Admission: RE | Admit: 2015-06-20 | Discharge: 2015-06-20 | Disposition: A | Discharge status: Home / Self Care | Payer: Medicare PPO | Source: Ambulatory Visit | Attending: Internal Medicine | Admitting: Internal Medicine

## 2015-06-20 DIAGNOSIS — M349 Systemic sclerosis, unspecified: Secondary | ICD-10-CM | POA: Diagnosis not present

## 2015-06-20 LAB — PULMONARY FUNCTION TEST
DL/VA % pred: 100 %
DL/VA: 4.94 ml/min/mmHg/L
DLCO UNC % PRED: 77 %
DLCO unc: 19.96 ml/min/mmHg
FEF 25-75 POST: 3.8 L/s
FEF 25-75 Pre: 2.99 L/sec
FEF2575-%Change-Post: 26 %
FEF2575-%PRED-PRE: 154 %
FEF2575-%Pred-Post: 195 %
FEV1-%Change-Post: 2 %
FEV1-%Pred-Post: 100 %
FEV1-%Pred-Pre: 97 %
FEV1-Post: 2.37 L
FEV1-Pre: 2.3 L
FEV1FVC-%Change-Post: 4 %
FEV1FVC-%Pred-Pre: 110 %
FEV6-%CHANGE-POST: 0 %
FEV6-%PRED-POST: 90 %
FEV6-%PRED-PRE: 91 %
FEV6-Post: 2.7 L
FEV6-Pre: 2.72 L
FEV6FVC-%CHANGE-POST: 0 %
FEV6FVC-%Pred-Post: 104 %
FEV6FVC-%Pred-Pre: 104 %
FVC-%CHANGE-POST: -1 %
FVC-%PRED-POST: 87 %
FVC-%PRED-PRE: 88 %
FVC-POST: 2.71 L
FVC-PRE: 2.74 L
PRE FEV1/FVC RATIO: 84 %
Post FEV1/FVC ratio: 87 %
Post FEV6/FVC ratio: 100 %
Pre FEV6/FVC Ratio: 99 %
RV % pred: 61 %
RV: 1.38 L
TLC % pred: 83 %
TLC: 4.36 L

## 2015-06-20 MED ORDER — ALBUTEROL SULFATE (2.5 MG/3ML) 0.083% IN NEBU
2.5000 mg | INHALATION_SOLUTION | Freq: Once | RESPIRATORY_TRACT | Status: AC
  Administered 2015-06-20: 2.5 mg via RESPIRATORY_TRACT

## 2015-06-29 ENCOUNTER — Encounter (HOSPITAL_COMMUNITY): Payer: Self-pay

## 2015-06-29 ENCOUNTER — Other Ambulatory Visit: Payer: Self-pay

## 2015-06-29 ENCOUNTER — Ambulatory Visit (HOSPITAL_COMMUNITY)
Admission: RE | Admit: 2015-06-29 | Discharge: 2015-06-29 | Disposition: A | Discharge status: Home / Self Care | Payer: Medicare PPO | Source: Ambulatory Visit | Attending: Internal Medicine | Admitting: Internal Medicine

## 2015-06-29 ENCOUNTER — Ambulatory Visit (HOSPITAL_COMMUNITY)
Admission: RE | Admit: 2015-06-29 | Discharge: 2015-06-29 | Disposition: A | Discharge status: Home / Self Care | Payer: BC Managed Care – PPO | Source: Ambulatory Visit | Attending: Internal Medicine | Admitting: Internal Medicine

## 2015-06-29 ENCOUNTER — Ambulatory Visit (HOSPITAL_BASED_OUTPATIENT_CLINIC_OR_DEPARTMENT_OTHER)
Admission: RE | Admit: 2015-06-29 | Discharge: 2015-06-29 | Disposition: A | Discharge status: Home / Self Care | Payer: Medicare PPO | Source: Ambulatory Visit | Attending: Internal Medicine | Admitting: Internal Medicine

## 2015-06-29 VITALS — BP 122/74 | HR 80 | Wt 176.5 lb

## 2015-06-29 DIAGNOSIS — E119 Type 2 diabetes mellitus without complications: Secondary | ICD-10-CM | POA: Insufficient documentation

## 2015-06-29 DIAGNOSIS — Z0000 Encounter for general adult medical examination without abnormal findings: Secondary | ICD-10-CM | POA: Diagnosis not present

## 2015-06-29 DIAGNOSIS — Z823 Family history of stroke: Secondary | ICD-10-CM | POA: Insufficient documentation

## 2015-06-29 DIAGNOSIS — Z7982 Long term (current) use of aspirin: Secondary | ICD-10-CM | POA: Diagnosis not present

## 2015-06-29 DIAGNOSIS — R0602 Shortness of breath: Secondary | ICD-10-CM

## 2015-06-29 DIAGNOSIS — Z79899 Other long term (current) drug therapy: Secondary | ICD-10-CM | POA: Insufficient documentation

## 2015-06-29 DIAGNOSIS — I7300 Raynaud's syndrome without gangrene: Secondary | ICD-10-CM | POA: Diagnosis not present

## 2015-06-29 DIAGNOSIS — M349 Systemic sclerosis, unspecified: Secondary | ICD-10-CM

## 2015-06-29 DIAGNOSIS — I498 Other specified cardiac arrhythmias: Secondary | ICD-10-CM | POA: Diagnosis not present

## 2015-06-29 DIAGNOSIS — I10 Essential (primary) hypertension: Secondary | ICD-10-CM | POA: Insufficient documentation

## 2015-06-29 DIAGNOSIS — H3530 Unspecified macular degeneration: Secondary | ICD-10-CM | POA: Diagnosis not present

## 2015-06-29 DIAGNOSIS — Z8249 Family history of ischemic heart disease and other diseases of the circulatory system: Secondary | ICD-10-CM | POA: Insufficient documentation

## 2015-06-29 DIAGNOSIS — E785 Hyperlipidemia, unspecified: Secondary | ICD-10-CM | POA: Insufficient documentation

## 2015-06-29 NOTE — Progress Notes (Signed)
  Echocardiogram 2D Echocardiogram has been performed.  [name] 06/29/2015, 11:09 AM

## 2015-06-29 NOTE — Progress Notes (Signed)
PULMONARY HYPERTENSION CLINIC  Patient ID: Andrea Heath, female   DOB: [birth year], 69 y.o.   MRN: [id number] Referring Physician: Dr. Berenice Heath  HPI:  Andrea Heath is a 69 y/o woman with HTN, HL,diabetes and scleroderma referred by Andrea Heath for enrollment into the Pulmonary HTN screening program.  Was diagnosed with scleroderma about 15 years ago. Never had problems with lungs or heart. Says she does have occasional dysphagia if she eats fast. In 2002 had barium swallow with mildly abnormal peristalsis. Has had Raynaud's but is well controlled with wering gloves. Goes to [employer] 3 days per week without dyspnea or CP. No LE edema. No palpitations or syncope. Scleroderma has not progressed. Mild skin tightening of fingers.    Echo 06/29/15: EF 60% grade 1 DD. +mitral annular calcification. RV normal. No PAH PFTs 06/20/15: FEV1 2.3 L (97%), FVC 2.7 L (88%) DLCO 77%    Review of Systems: [y] = yes,  = no   General: Weight gain ; Weight loss ; Anorexia ; Fatigue ; Fever ; Chills ; Weakness   Cardiac: Chest pain/pressure ; Resting SOB ; Exertional SOB ; Orthopnea ; Pedal Edema ; Palpitations ; Syncope ; Presyncope ; Paroxysmal nocturnal dyspnea[ ]   Pulmonary: Cough ; Wheezing[ ] ; Hemoptysis[ ] ; Sputum ; Snoring   GI: Vomiting[ ] ; Dysphagia[ ] ; Melena[ ] ; Hematochezia ; Heartburn[ ] ; Abdominal pain ; Constipation ; Diarrhea ; BRBPR   GU: Hematuria[ ] ; Dysuria ; Nocturia[ ]   Vascular: Pain in legs with walking ; Pain in feet with lying flat ; Non-healing sores ; Stroke ; TIA ; Slurred speech ;  Neuro: Headaches[ ] ; Vertigo[ ] ; Seizures[ ] ; Paresthesias[ ] ;Blurred vision ; Diplopia ; Vision changes   Ortho/Skin: Arthritis [place] ]; Joint pain [ y]; Muscle pain ; Joint swelling ; Back Pain ; Rash   Psych: Depression[ ] ; Anxiety[ ]   Heme: Bleeding problems ; Clotting disorders ;  Anemia   Endocrine: Diabetes [place] ]; Thyroid dysfunction[y ]   Past Medical History  Diagnosis Date  . Diabetes mellitus   . Hyperlipidemia   . Hypertension   . Keratosis seborrheica   . Rhinitis   . Scleroderma   . Macular degeneration of both eyes     Current Outpatient Prescriptions  Medication Sig Dispense Refill  . aspirin (BAYER ASPIRIN) 325 MG tablet Take 325 mg by mouth daily.      Marland Kitchen atorvastatin (LIPITOR) 20 MG tablet TAKE 1 TABLET BY MOUTH DAILY. 90 tablet 1  . BESIVANCE 0.6 % SUSP     . Biotin 10 MG TABS Take by mouth.    . cholecalciferol (VITAMIN D) 1000 UNITS tablet Take 1,000 Units by mouth daily.      . DUREZOL 0.05 % EMUL     . fluticasone (FLONASE) 50 MCG/ACT nasal spray USE 2 SPRAYS IN EACH NOSTRIL ONCE A DAY 16 g 2  . glipiZIDE (GLUCOTROL XL) 5 MG 24 hr tablet TAKE 1 TABLET BY MOUTH ONCE DAILY 90 tablet 0  . glucose blood (ONE TOUCH ULTRA TEST) test strip USE ONE TEST STRIP  EVERY DAY. DX. E11.9 100 each 6  . ILEVRO 0.3 % SUSP     . levothyroxine (SYNTHROID, LEVOTHROID) 75 MCG tablet TAKE 1 TABLET (75 MCG TOTAL) BY MOUTH  DAILY. 90 tablet 1  . lisinopril (PRINIVIL,ZESTRIL) 5 MG tablet Take 1 tablet (5 mg total) by mouth daily. 90 tablet 1  . Lutein 20 MG CAPS Take 1 capsule by mouth daily.    Marland Kitchen. omeprazole (PRILOSEC) 20 MG capsule Take 20 mg by mouth daily.    Marland Kitchen. amLODipine (NORVASC) 5 MG tablet Take 1 tablet (5 mg total) by mouth daily. (Patient not taking: Reported on 06/29/2015) 90 tablet 1   No current facility-administered medications for this encounter.    No Known Allergies    History   Social History  . Marital Status: Married    Spouse Name: N/A  . Number of Children: N/A  . Years of Education: N/A   Occupational History  . Not on file.   Social History Main Topics  . Smoking status: Never Smoker   . Smokeless tobacco: Not on file  . Alcohol Use: No  . Drug Use: No  . Sexual Activity: Not on file   Other Topics Concern  . Not on file    Social History Narrative      Family History  Problem Relation Age of Onset  . Stroke Mother   . Heart attack Mother   . Cancer Mother     breast cancer  . Lung cancer Father   . Achalasia Brother   . Arrhythmia Brother   . Arrhythmia Brother     Filed Vitals:   06/29/15 1123  BP: 122/74  Pulse: 80  Weight: 176 lb 8 oz (80.06 kg)  SpO2: 99%    PHYSICAL EXAM: General:  Well appearing. No respiratory difficulty HEENT: normal Neck: supple. no JVD. Carotids 2+ bilat; no bruits. No lymphadenopathy or thryomegaly appreciated. Cor: PMI nondisplaced. Regular rate & rhythm. No rubs, gallops or murmurs. Lungs: clear Abdomen: soft, nontender, nondistended. No hepatosplenomegaly. No bruits or masses. Good bowel sounds. Extremities: no cyanosis, clubbing, rash, edema Neuro: alert & oriented x 3, cranial nerves grossly intact. moves all 4 extremities w/o difficulty. Affect pleasant.  ECG: NSR 77. Mild RAD. No ST-T wave abnormalities. Lowvolts  ASSESSMENT & PLAN: 1. Scleroderma 2. HTN 3. Raynaud's  4. Diabetes  PFTs and echo reviewed with her. No evidence of PAH pr parenchymal lung disease. Will check CXR. Will see back in 1 year with repeat screening. If has 2 years of negative screens can likely extend screening interval to 2-3 years as she has had scleroderma for a long time without development of PAH.  Bensimhon, Daniel,MD 3:01 PM

## 2015-07-05 ENCOUNTER — Inpatient Hospital Stay: Admission: RE | Admit: 2015-07-05 | Payer: BC Managed Care – PPO | Source: Ambulatory Visit

## 2015-07-07 ENCOUNTER — Other Ambulatory Visit: Payer: Self-pay | Admitting: Rheumatology

## 2015-07-07 DIAGNOSIS — E2839 Other primary ovarian failure: Secondary | ICD-10-CM

## 2015-07-13 ENCOUNTER — Ambulatory Visit
Admission: RE | Admit: 2015-07-13 | Discharge: 2015-07-13 | Disposition: A | Discharge status: Home / Self Care | Payer: Medicare PPO | Source: Ambulatory Visit | Attending: Rheumatology | Admitting: Rheumatology

## 2015-07-13 DIAGNOSIS — E2839 Other primary ovarian failure: Secondary | ICD-10-CM

## 2016-01-14 ENCOUNTER — Emergency Department (HOSPITAL_COMMUNITY): Payer: Medicare PPO

## 2016-01-14 ENCOUNTER — Encounter (HOSPITAL_COMMUNITY): Payer: Self-pay | Admitting: *Deleted

## 2016-01-14 ENCOUNTER — Emergency Department (HOSPITAL_COMMUNITY)
Admission: EM | Admit: 2016-01-14 | Discharge: 2016-01-14 | Disposition: A | Discharge status: Home / Self Care | Payer: Medicare PPO | Attending: Emergency Medicine | Admitting: Emergency Medicine

## 2016-01-14 DIAGNOSIS — Z8669 Personal history of other diseases of the nervous system and sense organs: Secondary | ICD-10-CM | POA: Diagnosis not present

## 2016-01-14 DIAGNOSIS — Z7951 Long term (current) use of inhaled steroids: Secondary | ICD-10-CM | POA: Insufficient documentation

## 2016-01-14 DIAGNOSIS — Z872 Personal history of diseases of the skin and subcutaneous tissue: Secondary | ICD-10-CM | POA: Insufficient documentation

## 2016-01-14 DIAGNOSIS — Z79899 Other long term (current) drug therapy: Secondary | ICD-10-CM | POA: Insufficient documentation

## 2016-01-14 DIAGNOSIS — I10 Essential (primary) hypertension: Secondary | ICD-10-CM | POA: Insufficient documentation

## 2016-01-14 DIAGNOSIS — E119 Type 2 diabetes mellitus without complications: Secondary | ICD-10-CM | POA: Insufficient documentation

## 2016-01-14 DIAGNOSIS — Z8739 Personal history of other diseases of the musculoskeletal system and connective tissue: Secondary | ICD-10-CM | POA: Insufficient documentation

## 2016-01-14 DIAGNOSIS — Z7982 Long term (current) use of aspirin: Secondary | ICD-10-CM | POA: Diagnosis not present

## 2016-01-14 DIAGNOSIS — R531 Weakness: Secondary | ICD-10-CM | POA: Diagnosis present

## 2016-01-14 DIAGNOSIS — E785 Hyperlipidemia, unspecified: Secondary | ICD-10-CM | POA: Diagnosis not present

## 2016-01-14 DIAGNOSIS — Z8709 Personal history of other diseases of the respiratory system: Secondary | ICD-10-CM | POA: Diagnosis not present

## 2016-01-14 LAB — I-STAT TROPONIN, ED: Troponin i, poc: 0 ng/mL (ref 0.00–0.08)

## 2016-01-14 LAB — COMPREHENSIVE METABOLIC PANEL
ALBUMIN: 3.6 g/dL (ref 3.5–5.0)
ALK PHOS: 55 U/L (ref 38–126)
ALT: 25 U/L (ref 14–54)
AST: 24 U/L (ref 15–41)
Anion gap: 11 (ref 5–15)
BUN: 16 mg/dL (ref 6–20)
CALCIUM: 8.9 mg/dL (ref 8.9–10.3)
CHLORIDE: 104 mmol/L (ref 101–111)
CO2: 23 mmol/L (ref 22–32)
CREATININE: 0.96 mg/dL (ref 0.44–1.00)
GFR calc non Af Amer: 59 mL/min — ABNORMAL LOW (ref >60)
GLUCOSE: 211 mg/dL — AB (ref 65–99)
Potassium: 4.5 mmol/L (ref 3.5–5.1)
Sodium: 138 mmol/L (ref 135–145)
Total Bilirubin: 0.8 mg/dL (ref 0.3–1.2)
Total Protein: 7.1 g/dL (ref 6.5–8.1)

## 2016-01-14 LAB — CBC WITH DIFFERENTIAL/PLATELET
BASOS ABS: 0 10*3/uL (ref 0.0–0.1)
BASOS PCT: 0 %
EOS ABS: 0.1 10*3/uL (ref 0.0–0.7)
EOS PCT: 1 %
HCT: 40.7 % (ref 36.0–46.0)
HEMOGLOBIN: 13.7 g/dL (ref 12.0–15.0)
LYMPHS ABS: 1.3 10*3/uL (ref 0.7–4.0)
LYMPHS PCT: 22 %
MCH: 30.2 pg (ref 26.0–34.0)
MCHC: 33.7 g/dL (ref 30.0–36.0)
MCV: 89.8 fL (ref 78.0–100.0)
MONOS PCT: 6 %
Monocytes Absolute: 0.4 10*3/uL (ref 0.1–1.0)
NEUTROS ABS: 4.3 10*3/uL (ref 1.7–7.7)
NEUTROS PCT: 71 %
PLATELETS: 201 10*3/uL (ref 150–400)
RBC: 4.53 MIL/uL (ref 3.87–5.11)
RDW: 13.1 % (ref 11.5–15.5)
WBC: 6 10*3/uL (ref 4.0–10.5)

## 2016-01-14 LAB — URINALYSIS, ROUTINE W REFLEX MICROSCOPIC
BILIRUBIN URINE: NEGATIVE
Glucose, UA: NEGATIVE mg/dL
Hgb urine dipstick: NEGATIVE
KETONES UR: NEGATIVE mg/dL
NITRITE: NEGATIVE
Protein, ur: NEGATIVE mg/dL
SPECIFIC GRAVITY, URINE: 1.01 (ref 1.005–1.030)
pH: 7 (ref 5.0–8.0)

## 2016-01-14 LAB — CBG MONITORING, ED: GLUCOSE-CAPILLARY: 143 mg/dL — AB (ref 65–99)

## 2016-01-14 LAB — URINE MICROSCOPIC-ADD ON: RBC / HPF: NONE SEEN RBC/hpf (ref 0–5)

## 2016-01-14 LAB — TSH: TSH: 1.92 u[IU]/mL (ref 0.350–4.500)

## 2016-01-14 LAB — LIPASE, BLOOD: Lipase: 38 U/L (ref 11–51)

## 2016-01-14 MED ORDER — SODIUM CHLORIDE 0.9 % IV BOLUS (SEPSIS)
500.0000 mL | Freq: Once | INTRAVENOUS | Status: AC
  Administered 2016-01-14: 500 mL via INTRAVENOUS

## 2016-01-14 MED ORDER — ONDANSETRON HCL 4 MG/2ML IJ SOLN
4.0000 mg | Freq: Once | INTRAMUSCULAR | Status: AC
  Administered 2016-01-14: 4 mg via INTRAVENOUS
  Filled 2016-01-14: qty 2

## 2016-01-14 NOTE — ED Notes (Signed)
Patient complains that she woke this morning with generalized weakness. Patient complained of slight dizziness but was able to ambulate without assistance in the room. Patient denies any flu or cold like symptoms today.

## 2016-01-14 NOTE — ED Notes (Signed)
Patient aware that urine specimen is needed.

## 2016-01-14 NOTE — ED Provider Notes (Signed)
CSN: [id number]     Arrival date & time 01/14/16  [id number] History   First MD Initiated Contact with Patient 01/14/16 0820     Chief Complaint  Patient presents with  . Weakness     (Consider location/radiation/quality/duration/timing/severity/associated sxs/prior Treatment) HPI 70 year old female who presents with generalized weakness. History of HTN, HLD, DM, and scleroderma. In usual state of health. Woke up this morning to bake muffins at around 6:30AM which she normally does on Sunday. States that she began to feel "bad." Felt generalized weakness. Woke husband up and laid on couch. Husband called EMS. States eating and drinking normally recently. No recent fever, chills, N/V/D, chest pain, sob, syncope or near syncope, cough, congestion, urinary complaints.   Past Medical History  Diagnosis Date  . Diabetes mellitus   . Hyperlipidemia   . Hypertension   . Keratosis seborrheica   . Rhinitis   . Scleroderma (HCC)   . Macular degeneration of both eyes    Past Surgical History  Procedure Laterality Date  . Cholecystectomy    . Tubal ligation    . Mastectomy partial / lumpectomy      left breast    Family History  Problem Relation Age of Onset  . Stroke Mother   . Heart attack Mother   . Cancer Mother     breast cancer  . Lung cancer Father   . Achalasia Brother   . Arrhythmia Brother   . Arrhythmia Brother    Social History  Substance Use Topics  . Smoking status: Never Smoker   . Smokeless tobacco: None  . Alcohol Use: No   OB History    No data available     Review of Systems 10/14 systems reviewed and are negative other than those stated in the HPI   Allergies  Review of patient's allergies indicates no known allergies.  Home Medications   Prior to Admission medications   Medication Sig Start Date End Date Taking? Authorizing Provider  alendronate (FOSAMAX) 70 MG tablet Take 1 tablet by mouth every Monday. 11/08/15  Yes Historical Provider, MD   amLODipine (NORVASC) 5 MG tablet Take 1 tablet (5 mg total) by mouth daily. 11/22/14  Yes Sheliah HatchKatherine E Tabori, MD  aspirin (BAYER ASPIRIN) 325 MG tablet Take 325 mg by mouth daily.     Yes Historical Provider, MD  atorvastatin (LIPITOR) 20 MG tablet TAKE 1 TABLET BY MOUTH DAILY. 12/12/14  Yes Sheliah HatchKatherine E Tabori, MD  Biotin 10 MG TABS Take 1 tablet by mouth daily.    Yes Historical Provider, MD  calcium-vitamin D (OSCAL WITH D) 500-200 MG-UNIT tablet Take 1 tablet by mouth daily.   Yes Historical Provider, MD  fluticasone (FLONASE) 50 MCG/ACT nasal spray USE 2 SPRAYS IN EACH NOSTRIL ONCE A DAY 11/14/14  Yes Sheliah HatchKatherine E Tabori, MD  glipiZIDE (GLUCOTROL XL) 5 MG 24 hr tablet TAKE 1 TABLET BY MOUTH ONCE DAILY 01/24/15  Yes Sheliah HatchKatherine E Tabori, MD  levothyroxine (SYNTHROID, LEVOTHROID) 75 MCG tablet TAKE 1 TABLET (75 MCG TOTAL) BY MOUTH DAILY. 12/26/14  Yes Sheliah HatchKatherine E Tabori, MD  lisinopril (PRINIVIL,ZESTRIL) 5 MG tablet Take 1 tablet (5 mg total) by mouth daily. 11/22/14  Yes Sheliah HatchKatherine E Tabori, MD  Lutein 20 MG CAPS Take 1 capsule by mouth daily.   Yes Historical Provider, MD  omeprazole (PRILOSEC) 20 MG capsule Take 20 mg by mouth daily.   Yes Historical Provider, MD  glucose blood (ONE TOUCH ULTRA TEST) test strip USE ONE TEST  STRIP  EVERY DAY. DX. E11.9 12/06/14   Sheliah Hatch, MD   BP 136/77 mmHg  Pulse 85  Temp(Src) 98 F (36.7 C) (Oral)  Resp 20  SpO2 100% Physical Exam Physical Exam  Nursing note and vitals reviewed. Constitutional: Well developed, well nourished, non-toxic, and in no acute distress Head: Normocephalic and atraumatic.  Mouth/Throat: Oropharynx is clear and moist.  Neck: Normal range of motion. Neck supple.  Cardiovascular: Normal rate and regular rhythm.   Pulmonary/Chest: Effort normal and breath sounds normal.  Abdominal: Soft. There is no tenderness. There is no rebound and no guarding.  Musculoskeletal: Normal range of motion.  Neurological: Alert, no  facial droop, fluent speech, moves all extremities symmetrically, sensation to light touch in tact bilaterally, steady non-ataxic gait, PERRL Skin: Skin is warm and dry.  Psychiatric: Cooperative  ED Course  Procedures (including critical care time) Labs Review Labs Reviewed  COMPREHENSIVE METABOLIC PANEL - Abnormal; Notable for the following:    Glucose, Bld 211 (*)    GFR calc non Af Amer 59 (*)    All other components within normal limits  URINALYSIS, ROUTINE W REFLEX MICROSCOPIC (NOT AT [facility]) - Abnormal; Notable for the following:    Leukocytes, UA SMALL (*)    All other components within normal limits  URINE MICROSCOPIC-ADD ON - Abnormal; Notable for the following:    Squamous Epithelial / LPF 0-5 (*)    Bacteria, UA RARE (*)    All other components within normal limits  CBG MONITORING, ED - Abnormal; Notable for the following:    Glucose-Capillary 143 (*)    All other components within normal limits  CBC WITH DIFFERENTIAL/PLATELET  TSH  LIPASE, BLOOD  I-STAT TROPOININ, ED    Imaging Review Dg Chest 2 View  01/14/2016  CLINICAL DATA:  Near syncope. Dizziness. Diaphoresis. Weakness. Scleroderma. EXAM: CHEST  2 VIEW COMPARISON:  None. FINDINGS: The heart size and mediastinal contours are within normal limits. Both lungs are clear. No evidence of pneumothorax or pleural effusion. Thoracic spine degenerative changes noted. IMPRESSION: No active cardiopulmonary disease. Electronically Signed   By: Myles Rosenthal M.D.   On: 01/14/2016 09:41   Ct Head Wo Contrast  01/14/2016  CLINICAL DATA:  Transient episode of weakness this A.M. No injury EXAM: CT HEAD WITHOUT CONTRAST TECHNIQUE: Contiguous axial images were obtained from the base of the skull through the vertex without intravenous contrast. COMPARISON:  12/25/2006. FINDINGS: No evidence for acute infarction, hemorrhage, mass lesion, hydrocephalus, or extra-axial fluid. Normal for age cerebral volume. Minor hypoattenuation of white  matter could represent mild small vessel disease. Calvarium intact. No sinus or mastoid disease. Mild vascular calcification. IMPRESSION: Normal for age cerebral volume. No acute intracranial findings. Similar appearance to priors. Electronically Signed   By: Elsie Stain M.D.   On: 01/14/2016 12:04   I have personally reviewed and evaluated these images and lab results as part of my medical decision-making.   EKG Interpretation   Date/Time:  Sunday January 14 2016 08:22:26 EST Ventricular Rate:  66 PR Interval:  156 QRS Duration: 103 QT Interval:  443 QTC Calculation: 464 R Axis:   87 Text Interpretation:  Sinus rhythm Borderline right axis deviation Low  voltage, precordial leads No significant change since last tracing  Confirmed by Saliyah Gillin MD, Man Bonneau ([id number]) on 01/14/2016 10:28:22 AM      MDM   Final diagnoses:  Generalized weakness    70 year old female with history of DM, HTN, HLD who presents  with generalized weakness, now resolved. States she feels well and was ambulatory with steady gait. VS stable, and exam overall unremarkable. EKG without signs of heart strain or stigmata of arrhythmia. Recent work up for systemic scleroderma, with unremarkable ECHO and PFTs in 2016. Husband also stated that she had briefly forgotten that she had been baking muffins, but no other complaints. Seem less like TIA/neuro etiology, but CT head performed, visualized and unremarkable. No signs of infection on work-up. No electrolyte or metabolic derangements. Unclear of etiology of symptoms at this time, but does not think patient requires admission for further work-up at this time given that she is at her baseline. Discussed strict return instructions and close PCP follow-up. Patient and family expressed understanding of all discharge instructions and felt comfortable with the plan of care.     Lavera Guise, MD 01/15/16 [phone]

## 2016-01-14 NOTE — Discharge Instructions (Signed)
Please follow-up closely with your primary care doctor. Return without fail for worsening symptoms, including confusion, passing out, chest pain, difficulty breathing, numbness/weakness, difficulty walking or any other symptoms concerning to you.  Weakness Weakness is a lack of strength. It may be felt all over the body (generalized) or in one specific part of the body (focal). Some causes of weakness can be serious. You may need further medical evaluation, especially if you are elderly or you have a history of immunosuppression (such as chemotherapy or HIV), kidney disease, heart disease, or diabetes. CAUSES  Weakness can be caused by many different things, including:  Infection.  Physical exhaustion.  Internal bleeding or other blood loss that results in a lack of red blood cells (anemia).  Dehydration. This cause is more common in elderly people.  Side effects or electrolyte abnormalities from medicines, such as pain medicines or sedatives.  Emotional distress, anxiety, or depression.  Circulation problems, especially severe peripheral arterial disease.  Heart disease, such as rapid atrial fibrillation, bradycardia, or heart failure.  Nervous system disorders, such as Guillain-Barr syndrome, multiple sclerosis, or stroke. DIAGNOSIS  To find the cause of your weakness, your caregiver will take your history and perform a physical exam. Lab tests or X-rays may also be ordered, if needed. TREATMENT  Treatment of weakness depends on the cause of your symptoms and can vary greatly. HOME CARE INSTRUCTIONS   Rest as needed.  Eat a well-balanced diet.  Try to get some exercise every day.  Only take over-the-counter or prescription medicines as directed by your caregiver. SEEK MEDICAL CARE IF:   Your weakness seems to be getting worse or spreads to other parts of your body.  You develop new aches or pains. SEEK IMMEDIATE MEDICAL CARE IF:   You cannot perform your normal daily  activities, such as getting dressed and feeding yourself.  You cannot walk up and down stairs, or you feel exhausted when you do so.  You have shortness of breath or chest pain.  You have difficulty moving parts of your body.  You have weakness in only one area of the body or on only one side of the body.  You have a fever.  You have trouble speaking or swallowing.  You cannot control your bladder or bowel movements.  You have black or bloody vomit or stools. MAKE SURE YOU:  Understand these instructions.  Will watch your condition.  Will get help right away if you are not doing well or get worse.   This information is not intended to replace advice given to you by your health care provider. Make sure you discuss any questions you have with your health care provider.   Document Released: 12/16/2005 Document Revised: 06/16/2012 Document Reviewed: 02/14/2012 Elsevier Interactive Patient Education [employer]2016 [employer]

## 2016-01-14 NOTE — ED Notes (Signed)
Bed: [id number] Expected date: 01/14/16 Expected time: 8:12 AM Means of arrival: Ambulance Comments: weakness

## 2016-01-14 NOTE — ED Notes (Signed)
Patient transported to X-ray 

## 2016-01-14 NOTE — ED Notes (Signed)
Patient transported to CT 

## 2016-01-19 ENCOUNTER — Emergency Department (HOSPITAL_COMMUNITY): Payer: Medicare PPO

## 2016-01-19 ENCOUNTER — Observation Stay (HOSPITAL_COMMUNITY)
Admission: EM | Admit: 2016-01-19 | Discharge: 2016-01-21 | Disposition: A | Discharge status: Home / Self Care | Payer: Medicare PPO | Attending: Internal Medicine | Admitting: Internal Medicine

## 2016-01-19 ENCOUNTER — Encounter (HOSPITAL_COMMUNITY): Payer: Self-pay | Admitting: Emergency Medicine

## 2016-01-19 DIAGNOSIS — I10 Essential (primary) hypertension: Secondary | ICD-10-CM | POA: Diagnosis present

## 2016-01-19 DIAGNOSIS — M349 Systemic sclerosis, unspecified: Secondary | ICD-10-CM | POA: Diagnosis present

## 2016-01-19 DIAGNOSIS — Z79899 Other long term (current) drug therapy: Secondary | ICD-10-CM | POA: Insufficient documentation

## 2016-01-19 DIAGNOSIS — Z872 Personal history of diseases of the skin and subcutaneous tissue: Secondary | ICD-10-CM | POA: Diagnosis not present

## 2016-01-19 DIAGNOSIS — Z7951 Long term (current) use of inhaled steroids: Secondary | ICD-10-CM | POA: Insufficient documentation

## 2016-01-19 DIAGNOSIS — R079 Chest pain, unspecified: Secondary | ICD-10-CM | POA: Diagnosis present

## 2016-01-19 DIAGNOSIS — E785 Hyperlipidemia, unspecified: Secondary | ICD-10-CM | POA: Insufficient documentation

## 2016-01-19 DIAGNOSIS — E039 Hypothyroidism, unspecified: Secondary | ICD-10-CM

## 2016-01-19 DIAGNOSIS — E119 Type 2 diabetes mellitus without complications: Secondary | ICD-10-CM | POA: Diagnosis not present

## 2016-01-19 DIAGNOSIS — H3530 Unspecified macular degeneration: Secondary | ICD-10-CM | POA: Diagnosis not present

## 2016-01-19 DIAGNOSIS — Z7984 Long term (current) use of oral hypoglycemic drugs: Secondary | ICD-10-CM | POA: Diagnosis not present

## 2016-01-19 DIAGNOSIS — Z7982 Long term (current) use of aspirin: Secondary | ICD-10-CM | POA: Diagnosis not present

## 2016-01-19 DIAGNOSIS — R410 Disorientation, unspecified: Secondary | ICD-10-CM

## 2016-01-19 LAB — CBC WITH DIFFERENTIAL/PLATELET
Basophils Absolute: 0 10*3/uL (ref 0.0–0.1)
Basophils Relative: 0 %
Eosinophils Absolute: 0.1 10*3/uL (ref 0.0–0.7)
Eosinophils Relative: 1 %
HCT: 39.4 % (ref 36.0–46.0)
HEMOGLOBIN: 13.6 g/dL (ref 12.0–15.0)
LYMPHS ABS: 1.3 10*3/uL (ref 0.7–4.0)
LYMPHS PCT: 19 %
MCH: 30.1 pg (ref 26.0–34.0)
MCHC: 34.5 g/dL (ref 30.0–36.0)
MCV: 87.2 fL (ref 78.0–100.0)
MONOS PCT: 7 %
Monocytes Absolute: 0.5 10*3/uL (ref 0.1–1.0)
NEUTROS PCT: 73 %
Neutro Abs: 5 10*3/uL (ref 1.7–7.7)
Platelets: 197 10*3/uL (ref 150–400)
RBC: 4.52 MIL/uL (ref 3.87–5.11)
RDW: 13 % (ref 11.5–15.5)
WBC: 6.9 10*3/uL (ref 4.0–10.5)

## 2016-01-19 LAB — I-STAT TROPONIN, ED: TROPONIN I, POC: 0 ng/mL (ref 0.00–0.08)

## 2016-01-19 LAB — BASIC METABOLIC PANEL
Anion gap: 12 (ref 5–15)
BUN: 9 mg/dL (ref 6–20)
CHLORIDE: 108 mmol/L (ref 101–111)
CO2: 21 mmol/L — AB (ref 22–32)
CREATININE: 0.85 mg/dL (ref 0.44–1.00)
Calcium: 9 mg/dL (ref 8.9–10.3)
GFR calc Af Amer: >60 mL/min (ref >60)
GFR calc non Af Amer: >60 mL/min (ref >60)
GLUCOSE: 129 mg/dL — AB (ref 65–99)
POTASSIUM: 3.7 mmol/L (ref 3.5–5.1)
Sodium: 141 mmol/L (ref 135–145)

## 2016-01-19 LAB — GLUCOSE, CAPILLARY: GLUCOSE-CAPILLARY: 173 mg/dL — AB (ref 65–99)

## 2016-01-19 MED ORDER — LEVOTHYROXINE SODIUM 75 MCG PO TABS
75.0000 ug | ORAL_TABLET | Freq: Every day | ORAL | Status: DC
  Administered 2016-01-20 – 2016-01-21 (×2): 75 ug via ORAL
  Filled 2016-01-19 (×2): qty 1

## 2016-01-19 MED ORDER — INSULIN ASPART 100 UNIT/ML ~~LOC~~ SOLN
0.0000 [IU] | Freq: Three times a day (TID) | SUBCUTANEOUS | Status: DC
  Administered 2016-01-20: 2 [IU] via SUBCUTANEOUS
  Administered 2016-01-20 – 2016-01-21 (×2): 3 [IU] via SUBCUTANEOUS

## 2016-01-19 MED ORDER — ASPIRIN 325 MG PO TABS
325.0000 mg | ORAL_TABLET | Freq: Every day | ORAL | Status: DC
  Administered 2016-01-20 – 2016-01-21 (×2): 325 mg via ORAL
  Filled 2016-01-19 (×2): qty 1

## 2016-01-19 MED ORDER — MORPHINE SULFATE (PF) 2 MG/ML IV SOLN: 1.0000 mg | INTRAVENOUS | Status: DC | PRN

## 2016-01-19 MED ORDER — PANTOPRAZOLE SODIUM 40 MG PO TBEC
40.0000 mg | DELAYED_RELEASE_TABLET | Freq: Two times a day (BID) | ORAL | Status: DC
  Administered 2016-01-20 – 2016-01-21 (×4): 40 mg via ORAL
  Filled 2016-01-19 (×4): qty 1

## 2016-01-19 MED ORDER — ATORVASTATIN CALCIUM 20 MG PO TABS
20.0000 mg | ORAL_TABLET | Freq: Every day | ORAL | Status: DC
  Administered 2016-01-20 – 2016-01-21 (×2): 20 mg via ORAL
  Filled 2016-01-19 (×2): qty 1

## 2016-01-19 MED ORDER — LISINOPRIL 5 MG PO TABS
5.0000 mg | ORAL_TABLET | Freq: Every day | ORAL | Status: DC
  Administered 2016-01-20 – 2016-01-21 (×2): 5 mg via ORAL
  Filled 2016-01-19 (×2): qty 1

## 2016-01-19 MED ORDER — GI COCKTAIL ~~LOC~~: 30.0000 mL | Freq: Four times a day (QID) | ORAL | Status: DC | PRN

## 2016-01-19 MED ORDER — ACETAMINOPHEN 325 MG PO TABS
650.0000 mg | ORAL_TABLET | ORAL | Status: DC | PRN
  Administered 2016-01-20: 650 mg via ORAL
  Filled 2016-01-19: qty 2

## 2016-01-19 MED ORDER — AMLODIPINE BESYLATE 5 MG PO TABS
5.0000 mg | ORAL_TABLET | Freq: Every day | ORAL | Status: DC
Start: 2016-01-20 — End: 2016-01-21
  Administered 2016-01-20 – 2016-01-21 (×2): 5 mg via ORAL
  Filled 2016-01-19 (×2): qty 1

## 2016-01-19 MED ORDER — ONDANSETRON HCL 4 MG/2ML IJ SOLN: 4.0000 mg | Freq: Four times a day (QID) | INTRAMUSCULAR | Status: DC | PRN

## 2016-01-19 MED ORDER — SODIUM CHLORIDE 0.9 % IV SOLN: INTRAVENOUS | Status: DC

## 2016-01-19 MED ORDER — ENOXAPARIN SODIUM 40 MG/0.4ML ~~LOC~~ SOLN
40.0000 mg | SUBCUTANEOUS | Status: DC
Start: 2016-01-20 — End: 2016-01-21
  Administered 2016-01-20 – 2016-01-21 (×2): 40 mg via SUBCUTANEOUS
  Filled 2016-01-19 (×2): qty 0.4

## 2016-01-19 NOTE — H&P (Addendum)
[facility] History and Physical  Andrea Heath:[id number] DOB: [birth year] DOA: 01/19/2016   PCP: Gerrit Heck, MD  Specialists: Dr. Estanislado Pandy is her rheumatologist  Chief Complaint: chest pain on and off for a week  HPI: Andrea Heath is a 70 y.o. female with a past medical history of diabetes, on oral agents, hypertension, scleroderma with Raynaud and who was in her usual state of health about a week ago when she started developing left-sided chest pain. It can occur with exertion or at rest. It was aching kind of pain with no radiation. Not associated with the nausea, vomiting, palpitations. No shortness of breath. No leg swelling. No fever, no chills. Does have acid reflux but she takes omeprazole on a regular basis with the relief of her symptoms. No recent travel. She is never had any cardiac issues in the past. She did have an echocardiogram in June of last year which did not show any wall motion abnormalities. EF was normal. This was done at the request of her rheumatologist. Pain could be anywhere between 3-5 out of 10 in intensity. Currently, she denies any chest pain at all. These episodes last about 15-20 minutes.  The patient tells me that about a week ago she had an episode where she became very weak. Her husband tells me that she couldn't remember what she had done earlier in the day. She was taken to the emergency department. She underwent workup there including CT scan of the head. After about an hour and half, her symptoms resolved. Her amnesia improved. No clear etiology was found. She saw her primary care physician this past Monday to discuss this episode further. It doesn't appear that any other workup is planned. Patient denies any history of seizures.  Home Medications: Prior to Admission medications   Medication Sig Start Date End Date Taking? Authorizing Provider  alendronate (FOSAMAX) 70 MG tablet Take 1 tablet by mouth every Monday. 11/08/15  Yes  Historical Provider, MD  amLODipine (NORVASC) 5 MG tablet Take 1 tablet (5 mg total) by mouth daily. 11/22/14  Yes Midge Minium, MD  aspirin (BAYER ASPIRIN) 325 MG tablet Take 325 mg by mouth daily.     Yes Historical Provider, MD  atorvastatin (LIPITOR) 20 MG tablet TAKE 1 TABLET BY MOUTH DAILY. 12/12/14  Yes Midge Minium, MD  calcium-vitamin D (OSCAL WITH D) 500-200 MG-UNIT tablet Take 1 tablet by mouth daily.   Yes Historical Provider, MD  fluticasone (FLONASE) 50 MCG/ACT nasal spray USE 2 SPRAYS IN EACH NOSTRIL ONCE A DAY 11/14/14  Yes Midge Minium, MD  glipiZIDE (GLUCOTROL XL) 5 MG 24 hr tablet TAKE 1 TABLET BY MOUTH ONCE DAILY 01/24/15  Yes Midge Minium, MD  levothyroxine (SYNTHROID, LEVOTHROID) 75 MCG tablet TAKE 1 TABLET (75 MCG TOTAL) BY MOUTH DAILY. 12/26/14  Yes Midge Minium, MD  lisinopril (PRINIVIL,ZESTRIL) 5 MG tablet Take 1 tablet (5 mg total) by mouth daily. 11/22/14  Yes Midge Minium, MD  Lutein 20 MG CAPS Take 1 capsule by mouth daily.   Yes Historical Provider, MD  omeprazole (PRILOSEC) 20 MG capsule Take 20 mg by mouth daily.   Yes Historical Provider, MD    Allergies: No Known Allergies  Past Medical History: Past Medical History  Diagnosis Date  . Diabetes mellitus   . Hyperlipidemia   . Hypertension   . Keratosis seborrheica   . Rhinitis   . Scleroderma ([place])   . Macular degeneration of both eyes  Past Surgical History  Procedure Laterality Date  . Cholecystectomy    . Tubal ligation    . Mastectomy partial / lumpectomy      left breast     Social History: She lives with her husband in [place]. Denies any history of smoking, alcohol use or illicit drug use. Usually independent with daily activities.  Family History:  Family History  Problem Relation Age of Onset  . Stroke Mother   . Heart attack Mother   . Cancer Mother     breast cancer  . Lung cancer Father   . Achalasia Brother   . Arrhythmia Brother   .  Arrhythmia Brother      Review of Systems - History obtained from the patient General ROS: negative Psychological ROS: negative Ophthalmic ROS: negative ENT ROS: negative Allergy and Immunology ROS: negative Hematological and Lymphatic ROS: negative Endocrine ROS: negative Respiratory ROS: no cough, shortness of breath, or wheezing Cardiovascular ROS: as in hpi Gastrointestinal ROS: no abdominal pain, change in bowel habits, or black or bloody stools Genito-Urinary ROS: no dysuria, trouble voiding, or hematuria Musculoskeletal ROS: negative Neurological ROS: no TIA or stroke symptoms Dermatological ROS: negative  Physical Examination  Filed Vitals:   01/19/16 1853 01/19/16 2100  BP: 156/79 137/73  Pulse: 77 92  Temp: 98.5 F (36.9 C)   TempSrc: Oral   Resp: 18 24  SpO2: 99% 97%    BP 137/73 mmHg  Pulse 92  Temp(Src) 98.5 F (36.9 C) (Oral)  Resp 24  SpO2 97%  General appearance: alert, cooperative, appears stated age and no distress Head: Normocephalic, without obvious abnormality, atraumatic Eyes: conjunctivae/corneas clear. PERRL, EOM's intact.  Throat: lips, mucosa, and tongue normal; teeth and gums normal Neck: no adenopathy, no carotid bruit, no JVD, supple, symmetrical, trachea midline and thyroid not enlarged, symmetric, no tenderness/mass/nodules Resp: clear to auscultation bilaterally Chest wall: no tenderness Cardio: regular rate and rhythm, S1, S2 normal, no murmur, click, rub or gallop GI: soft, non-tender; bowel sounds normal; no masses,  no organomegaly Extremities: extremities normal, atraumatic, no cyanosis or edema Pulses: 2+ and symmetric Skin: Skin color, texture, turgor normal. No rashes or lesions Lymph nodes: Cervical, supraclavicular, and axillary nodes normal. Neurologic: Alert and oriented 3. Cranial nerve II-12 intact. Motor strength equal bilateral upper and lower extremity.  Laboratory Data: Results for orders placed or performed  during the hospital encounter of 01/19/16 (from the past 48 hour(s))  CBC with Differential     Status: None   Collection Time: 01/19/16  7:55 PM  Result Value Ref Range   WBC 6.9 4.0 - 10.5 K/uL   RBC 4.52 3.87 - 5.11 MIL/uL   Hemoglobin 13.6 12.0 - 15.0 g/dL   HCT 39.4 36.0 - 46.0 %   MCV 87.2 78.0 - 100.0 fL   MCH 30.1 26.0 - 34.0 pg   MCHC 34.5 30.0 - 36.0 g/dL   RDW 13.0 11.5 - 15.5 %   Platelets 197 150 - 400 K/uL   Neutrophils Relative % 73 %   Neutro Abs 5.0 1.7 - 7.7 K/uL   Lymphocytes Relative 19 %   Lymphs Abs 1.3 0.7 - 4.0 K/uL   Monocytes Relative 7 %   Monocytes Absolute 0.5 0.1 - 1.0 K/uL   Eosinophils Relative 1 %   Eosinophils Absolute 0.1 0.0 - 0.7 K/uL   Basophils Relative 0 %   Basophils Absolute 0.0 0.0 - 0.1 K/uL  Basic metabolic panel     Status: Abnormal  Collection Time: 01/19/16  7:55 PM  Result Value Ref Range   Sodium 141 135 - 145 mmol/L   Potassium 3.7 3.5 - 5.1 mmol/L   Chloride 108 101 - 111 mmol/L   CO2 21 (L) 22 - 32 mmol/L   Glucose, Bld 129 (H) 65 - 99 mg/dL   BUN 9 6 - 20 mg/dL   Creatinine, Ser 0.85 0.44 - 1.00 mg/dL   Calcium 9.0 8.9 - 10.3 mg/dL   GFR calc non Af Amer >60 >60 mL/min   GFR calc Af Amer >60 >60 mL/min    Comment: (NOTE) The eGFR has been calculated using the CKD EPI equation. This calculation has not been validated in all clinical situations. eGFR's persistently <60 mL/min signify possible Chronic Kidney Disease.    Anion gap 12 5 - 15  I-Stat Troponin, ED (not at [facility])     Status: None   Collection Time: 01/19/16  7:59 PM  Result Value Ref Range   Troponin i, poc 0.00 0.00 - 0.08 ng/mL   Comment 3            Comment: Due to the release kinetics of cTnI, a negative result within the first hours of the onset of symptoms does not rule out myocardial infarction with certainty. If myocardial infarction is still suspected, repeat the test at appropriate intervals.     Radiology Reports: Dg Chest 2  View  01/19/2016  CLINICAL DATA:  Chest pain. EXAM: CHEST  2 VIEW COMPARISON:  01/14/2016 FINDINGS: Cardiomediastinal silhouette is normal. Mediastinal contours appear intact. Tortuosity and atherosclerotic disease of the aorta are noted. There is no evidence of focal airspace consolidation, pleural effusion or pneumothorax. Osseous structures are without acute abnormality. Ankylosing flowing right-sided thoracic osteophytes are consistent with the diffuse idiopathic skeletal hyperostosis. Cholecystectomy clips are seen. Soft tissues are otherwise grossly normal. IMPRESSION: No active cardiopulmonary disease. Atherosclerotic disease of the aorta. Electronically Signed   By: Fidela Salisbury M.D.   On: 01/19/2016 20:18    My interpretation of Electrocardiogram: Sinus rhythm at 85 bpm. Normal axis. Intervals appear to be normal. No concerning ST or T-wave changes.  Problem List  Principal Problem:   Chest pain Active Problems:   Essential hypertension   SCLERODERMA   DM type 2 (diabetes mellitus, type 2) (HCC)   Assessment: This is a 70 year old Caucasian female with past medical history as stated earlier, who presents with chest pain on and off for a week. EKG does not show any acute ischemic changes. She does have multiple risk factors for coronary artery disease including hypertension, diabetes and dyslipidemia. However her symptoms are somewhat atypical. She also has a history of GERD and is noted to be on bisphosphonates for osteoporosis. No skin lesions identified.  Plan: #1 chest pain: She will be observed in the hospital and ruled out for acute coronary syndrome by serial troponins. She may well benefit from a stress test considering her multiple risk factors. Continue aspirin. EKG will be repeated in the morning. PPI.  #2 Recent episode of transient memory loss: Etiology is unclear. I have asked the patient to discuss this further with her PCP. Due to her risk factors we will get a  carotid Doppler. She does not have any focal neurological deficits at this time.  #3 diabetes mellitus type 2: She is on oral agents. She was placed on sliding scale coverage here in the hospital. Monitor CBGs.  #4 history of essential hypertension: Monitor blood pressures closely. Continue her home  medication regimen.  #5 history of dyslipidemia: Continue home medications.   #6 history of hypothyroidism: Continue levothyroxine.  #7 History of scleroderma: Followed by a rheumatologist as outpatient.  DVT Prophylaxis: Lovenox Code Status: Full code Family Communication: Discussed with the patient and her husband and daughter  Disposition Plan: Observe to telemetry   Further management decisions will depend on results of further testing and patient's response to treatment.   [facility]  [facility] Pager [phone]  If 7PM-7AM, please contact night-coverage [email or web address] Password [email or web address]  01/19/2016, 9:55 PM

## 2016-01-19 NOTE — ED Notes (Signed)
Pt to xray

## 2016-01-19 NOTE — ED Notes (Signed)
Pt pain free at this time.

## 2016-01-19 NOTE — ED Notes (Addendum)
Pt reports dull aching chest pain while watching TV. Repeat EKG completed and given to Dr.Mackuen. No changes noted to EKG.

## 2016-01-19 NOTE — ED Notes (Signed)
Attempted to call report

## 2016-01-19 NOTE — ED Notes (Signed)
Admitting MD at bedside.

## 2016-01-19 NOTE — ED Notes (Signed)
Pt to ER via [facility] with complaint of left central chest pain, described as dull 4/10. Pt reports it as intermittent. Pt was seen last week at [facility] for same and was discharged. BP 138/83, HR 80. Pt is not having cp at this time. Pt took 324 mg at home prior to EMS arrival. Pt has hx of scleroderma and raynauds. Pt is a/o x4.

## 2016-01-19 NOTE — ED Provider Notes (Signed)
CSN: [id number]     Arrival date & time 01/19/16  1841 History   First MD Initiated Contact with Patient 01/19/16 1842     Chief Complaint  Patient presents with  . Chest Pain     (Consider location/radiation/quality/duration/timing/severity/associated sxs/prior Treatment) HPI  This is a 70 year old female with past medical history of diabetes hyperlipidemia, hypertension, scleroderma, who comes in with complaint of chest pain. Chest pain is been going on for the last week per the patient. The chest pain is intermittent, lasts for 5 minutes at a time, is left-sided, pressure-like, nonradiating. Patient denies any cough, fever or chills. She's not had a recent stress test but she did have a echo last year that was unremarkable.  Past Medical History  Diagnosis Date  . Diabetes mellitus   . Hyperlipidemia   . Hypertension   . Keratosis seborrheica   . Rhinitis   . Scleroderma (HCC)   . Macular degeneration of both eyes    Past Surgical History  Procedure Laterality Date  . Cholecystectomy    . Tubal ligation    . Mastectomy partial / lumpectomy      left breast    Family History  Problem Relation Age of Onset  . Stroke Mother   . Heart attack Mother   . Cancer Mother     breast cancer  . Lung cancer Father   . Achalasia Brother   . Arrhythmia Brother   . Arrhythmia Brother    Social History  Substance Use Topics  . Smoking status: Never Smoker   . Smokeless tobacco: None  . Alcohol Use: No   OB History    No data available     Review of Systems  Constitutional: Negative for fever and chills.  HENT: Negative for nosebleeds.   Eyes: Negative for visual disturbance.  Respiratory: Negative for cough and shortness of breath.   Cardiovascular: Positive for chest pain.  Gastrointestinal: Negative for nausea, vomiting, abdominal pain, diarrhea and constipation.  Genitourinary: Negative for dysuria.  Skin: Negative for rash.  Neurological: Negative for weakness.   All other systems reviewed and are negative.     Allergies  Review of patient's allergies indicates no known allergies.  Home Medications   Prior to Admission medications   Medication Sig Start Date End Date Taking? Authorizing Provider  alendronate (FOSAMAX) 70 MG tablet Take 1 tablet by mouth every Monday. 11/08/15  Yes Historical Provider, MD  amLODipine (NORVASC) 5 MG tablet Take 1 tablet (5 mg total) by mouth daily. 11/22/14  Yes Sheliah Hatch, MD  aspirin (BAYER ASPIRIN) 325 MG tablet Take 325 mg by mouth daily.     Yes Historical Provider, MD  atorvastatin (LIPITOR) 20 MG tablet TAKE 1 TABLET BY MOUTH DAILY. 12/12/14  Yes Sheliah Hatch, MD  calcium-vitamin D (OSCAL WITH D) 500-200 MG-UNIT tablet Take 1 tablet by mouth daily.   Yes Historical Provider, MD  fluticasone (FLONASE) 50 MCG/ACT nasal spray USE 2 SPRAYS IN EACH NOSTRIL ONCE A DAY 11/14/14  Yes Sheliah Hatch, MD  glipiZIDE (GLUCOTROL XL) 5 MG 24 hr tablet TAKE 1 TABLET BY MOUTH ONCE DAILY 01/24/15  Yes Sheliah Hatch, MD  levothyroxine (SYNTHROID, LEVOTHROID) 75 MCG tablet TAKE 1 TABLET (75 MCG TOTAL) BY MOUTH DAILY. 12/26/14  Yes Sheliah Hatch, MD  lisinopril (PRINIVIL,ZESTRIL) 5 MG tablet Take 1 tablet (5 mg total) by mouth daily. 11/22/14  Yes Sheliah Hatch, MD  Lutein 20 MG CAPS Take 1 capsule by  mouth daily.   Yes Historical Provider, MD  omeprazole (PRILOSEC) 20 MG capsule Take 20 mg by mouth daily.   Yes Historical Provider, MD   BP 130/76 mmHg  Pulse 84  Temp(Src) 98.5 F (36.9 C) (Oral)  Resp 27  SpO2 99% Physical Exam  Constitutional: She is oriented to person, place, and time. No distress.  HENT:  Head: Normocephalic and atraumatic.  Eyes: EOM are normal. Pupils are equal, round, and reactive to light.  Neck: Normal range of motion. Neck supple.  Cardiovascular: Normal rate and intact distal pulses.   Pulmonary/Chest: No respiratory distress.  Abdominal: Soft. There is no  tenderness.  Musculoskeletal: Normal range of motion.  Neurological: She is alert and oriented to person, place, and time.  Skin: No rash noted. She is not diaphoretic.  Psychiatric: She has a normal mood and affect.    ED Course  Procedures (including critical care time) Labs Review Labs Reviewed  BASIC METABOLIC PANEL - Abnormal; Notable for the following:    CO2 21 (*)    Glucose, Bld 129 (*)    All other components within normal limits  CBC WITH DIFFERENTIAL/PLATELET  Rosezena Sensor, ED    Imaging Review Dg Chest 2 View  01/19/2016  CLINICAL DATA:  Chest pain. EXAM: CHEST  2 VIEW COMPARISON:  01/14/2016 FINDINGS: Cardiomediastinal silhouette is normal. Mediastinal contours appear intact. Tortuosity and atherosclerotic disease of the aorta are noted. There is no evidence of focal airspace consolidation, pleural effusion or pneumothorax. Osseous structures are without acute abnormality. Ankylosing flowing right-sided thoracic osteophytes are consistent with the diffuse idiopathic skeletal hyperostosis. Cholecystectomy clips are seen. Soft tissues are otherwise grossly normal. IMPRESSION: No active cardiopulmonary disease. Atherosclerotic disease of the aorta. Electronically Signed   By: Ted Mcalpine M.D.   On: 01/19/2016 20:18   I have personally reviewed and evaluated these images and lab results as part of my medical decision-making.   EKG Interpretation   Date/Time:  Friday January 19 2016 18:49:55 EST Ventricular Rate:  85 PR Interval:  158 QRS Duration: 94 QT Interval:  401 QTC Calculation: 477 R Axis:   -153 Text Interpretation:  Sinus rhythm Low voltage, precordial leads Probable  right ventricular hype no acute ischemia No significant change since last  tracing Confirmed by Kandis Mannan ([id number]) on 01/19/2016 8:12:13 PM      MDM   Final diagnoses:  Chest pain, unspecified chest pain type    This is a 70 year old female with past medical history of  diabetes hyperlipidemia, hypertension, scleroderma, who comes in with complaint of chest pain  Exam as above.  Patient is high risk for coronary disease based off of her a risk factors. Her story sounds moderate to high suspicion for cardiac story.  I feel it PE is less likely than ACS given that she is not tachycardic, not hypoxic, no leg swelling. Her pain does not radiate to her back and is not described as tearing I doubt dissection.  EKG reviewed and unremarkable. Will obtain CBC, BMP, chest x-ray, troponin.  Chest x-ray unremarkable, troponin negative, will admit to medicine for chest pain rule out possible stress testing.    Silas Flood, MD 01/19/16 2330  Courteney Randall An, MD 01/19/16 1914

## 2016-01-20 ENCOUNTER — Observation Stay (HOSPITAL_COMMUNITY): Payer: Medicare PPO

## 2016-01-20 ENCOUNTER — Observation Stay (HOSPITAL_BASED_OUTPATIENT_CLINIC_OR_DEPARTMENT_OTHER): Payer: Medicare PPO

## 2016-01-20 DIAGNOSIS — R410 Disorientation, unspecified: Secondary | ICD-10-CM | POA: Diagnosis not present

## 2016-01-20 DIAGNOSIS — E119 Type 2 diabetes mellitus without complications: Secondary | ICD-10-CM | POA: Diagnosis not present

## 2016-01-20 DIAGNOSIS — R079 Chest pain, unspecified: Secondary | ICD-10-CM

## 2016-01-20 DIAGNOSIS — I10 Essential (primary) hypertension: Secondary | ICD-10-CM | POA: Diagnosis not present

## 2016-01-20 DIAGNOSIS — E785 Hyperlipidemia, unspecified: Secondary | ICD-10-CM | POA: Diagnosis not present

## 2016-01-20 LAB — NM MYOCAR MULTI W/SPECT W/WALL MOTION / EF
CHL CUP RESTING HR STRESS: 99 {beats}/min
CSEPEDS: 39 s
Estimated workload: 6.5 METS
Exercise duration (min): 4 min
Peak HR: 148 {beats}/min
Percent HR: 98 %

## 2016-01-20 LAB — LIPID PANEL
Cholesterol: 132 mg/dL (ref 0–200)
HDL: 37 mg/dL — ABNORMAL LOW (ref >40)
LDL CALC: 67 mg/dL (ref 0–99)
TRIGLYCERIDES: 142 mg/dL (ref <150)
Total CHOL/HDL Ratio: 3.6 RATIO
VLDL: 28 mg/dL (ref 0–40)

## 2016-01-20 LAB — TROPONIN I
Troponin I: <0.03 ng/mL (ref <0.031)
Troponin I: <0.03 ng/mL (ref <0.031)

## 2016-01-20 LAB — GLUCOSE, CAPILLARY
GLUCOSE-CAPILLARY: 162 mg/dL — AB (ref 65–99)
Glucose-Capillary: 146 mg/dL — ABNORMAL HIGH (ref 65–99)
Glucose-Capillary: 137 mg/dL — ABNORMAL HIGH (ref 65–99)

## 2016-01-20 MED ORDER — TECHNETIUM TC 99M SESTAMIBI - CARDIOLITE
30.0000 | Freq: Once | INTRAVENOUS | Status: AC | PRN
  Administered 2016-01-20: 13:00:00 30 via INTRAVENOUS

## 2016-01-20 MED ORDER — TECHNETIUM TC 99M SESTAMIBI GENERIC - CARDIOLITE
10.0000 | Freq: Once | INTRAVENOUS | Status: AC | PRN
  Administered 2016-01-20: 10 via INTRAVENOUS

## 2016-01-20 NOTE — Progress Notes (Signed)
VASCULAR LAB PRELIMINARY  PRELIMINARY  PRELIMINARY  PRELIMINARY  Carotid duplex completed.    Preliminary report:  1-39% ICA plaquing.  Vertebral artery flow is antegrade.   Lindzy Rupert, RVT 01/20/2016, 1:40 PM

## 2016-01-20 NOTE — Progress Notes (Signed)
[facility] PROGRESS NOTE  LEIGHTON LUSTER ZOX:[id number] DOB: Oct 05, 1946 DOA: 01/19/2016 PCP: Gaye Alken, MD  Assessment/Plan: Atypical chest pain: resolved.  myoview stress ordered. And is negative for inducible ischemia. HDL is 37 and LDL is 67. Cardiac enzymes is negative. EKG does not show any ischemic changes.   Hypothyroidism: tsh is 1.9, resume synthroid.   GERD:  Resume protonix, increased to BID  Hypertension: Controlled.    Code Status: full code Family Communication: none at bedside Disposition Plan: pending.    Consultants:  none  Procedures:  Myoview  echocardiogram  Antibiotics:  none  HPI/Subjective: Currently chest pain free.   Objective: Filed Vitals:   01/20/16 0942 01/20/16 1202  BP: 137/67 144/76  Pulse:    Temp:    Resp:      Intake/Output Summary (Last 24 hours) at 01/20/16 1214 Last data filed at 01/20/16 0500  Gross per 24 hour  Intake    120 ml  Output      0 ml  Net    120 ml   Filed Weights   01/19/16 2337 01/20/16 0500  Weight: 79.062 kg (174 lb 4.8 oz) 78.654 kg (173 lb 6.4 oz)    Exam:   General:  Alert chest pain free  Cardiovascular: s1s2.   Respiratory: clear to auscultation, no wheezing or rhonchi  Abdomen: soft non tender non distended bowel sounds heard  Musculoskeletal: no pedal edema.   Data Reviewed: Basic Metabolic Panel:  Recent Labs Lab 01/14/16 0845 01/19/16 1955  NA 138 141  K 4.5 3.7  CL 104 108  CO2 23 21*  GLUCOSE 211* 129*  BUN 16 9  CREATININE 0.96 0.85  CALCIUM 8.9 9.0   Liver Function Tests:  Recent Labs Lab 01/14/16 0845  AST 24  ALT 25  ALKPHOS 55  BILITOT 0.8  PROT 7.1  ALBUMIN 3.6    Recent Labs Lab 01/14/16 0845  LIPASE 38   No results for input(s): AMMONIA in the last 168 hours. CBC:  Recent Labs Lab 01/14/16 0845 01/19/16 1955  WBC 6.0 6.9  NEUTROABS 4.3 5.0  HGB 13.7 13.6  HCT 40.7 39.4  MCV 89.8 87.2  PLT 201 197    Cardiac Enzymes:  Recent Labs Lab 01/20/16 0041  TROPONINI <0.03   BNP (last 3 results) No results for input(s): BNP in the last 8760 hours.  ProBNP (last 3 results) No results for input(s): PROBNP in the last 8760 hours.  CBG:  Recent Labs Lab 01/14/16 0826 01/19/16 2359 01/20/16 0736  GLUCAP 143* 173* 146*    No results found for this or any previous visit (from the past 240 hour(s)).   Studies: Dg Chest 2 View  01/19/2016  CLINICAL DATA:  Chest pain. EXAM: CHEST  2 VIEW COMPARISON:  01/14/2016 FINDINGS: Cardiomediastinal silhouette is normal. Mediastinal contours appear intact. Tortuosity and atherosclerotic disease of the aorta are noted. There is no evidence of focal airspace consolidation, pleural effusion or pneumothorax. Osseous structures are without acute abnormality. Ankylosing flowing right-sided thoracic osteophytes are consistent with the diffuse idiopathic skeletal hyperostosis. Cholecystectomy clips are seen. Soft tissues are otherwise grossly normal. IMPRESSION: No active cardiopulmonary disease. Atherosclerotic disease of the aorta. Electronically Signed   By: Ted Mcalpine M.D.   On: 01/19/2016 20:18    Scheduled Meds: . amLODipine  5 mg Oral Daily  . aspirin  325 mg Oral Daily  . atorvastatin  20 mg Oral Daily  . enoxaparin (LOVENOX) injection  40 mg Subcutaneous Q24H  .  insulin aspart  0-15 Units Subcutaneous TID WC  . levothyroxine  75 mcg Oral QAC breakfast  . lisinopril  5 mg Oral Daily  . pantoprazole  40 mg Oral BID   Continuous Infusions: . sodium chloride      Principal Problem:   Chest pain Active Problems:   Essential hypertension   SCLERODERMA   DM type 2 (diabetes mellitus, type 2) (HCC)    Time spent: 30 minutes    Jerney Baksh  [facility] Pager [phone] If 7PM-7AM, please contact night-coverage at [email or web address], password [facility] 01/20/2016, 12:14 PM

## 2016-01-20 NOTE — Progress Notes (Signed)
Nuc result not yet read by [facility]iology. Primary team made aware. Dayna Dunn PA-C

## 2016-01-21 ENCOUNTER — Observation Stay (HOSPITAL_COMMUNITY): Payer: Medicare PPO

## 2016-01-21 DIAGNOSIS — R079 Chest pain, unspecified: Secondary | ICD-10-CM | POA: Diagnosis not present

## 2016-01-21 DIAGNOSIS — E119 Type 2 diabetes mellitus without complications: Secondary | ICD-10-CM | POA: Diagnosis not present

## 2016-01-21 DIAGNOSIS — I10 Essential (primary) hypertension: Secondary | ICD-10-CM | POA: Diagnosis not present

## 2016-01-21 LAB — GLUCOSE, CAPILLARY: Glucose-Capillary: 172 mg/dL — ABNORMAL HIGH (ref 65–99)

## 2016-01-21 MED ORDER — OMEPRAZOLE 20 MG PO CPDR: 40.0000 mg | DELAYED_RELEASE_CAPSULE | Freq: Every day | ORAL | Status: DC

## 2016-01-21 NOTE — Progress Notes (Addendum)
Stress Test Result:   IMPRESSION: 1. No inducible myocardial ischemia or evidence of infarction. 2. Normal left ventricular wall motion. 3. Left ventricular ejection fraction 79% 4. Low-risk stress test findings*.  Primary team to further act on these results. Cardiology is not formally following - please call with questions. Dayna Dunn PA-C

## 2016-01-22 NOTE — Discharge Summary (Addendum)
Physician Discharge Summary  Andrea Heath:[id number] DOB: May 08, 1946 DOA: 01/19/2016  PCP: Gaye Alken, MD  Admit date: 01/19/2016 Discharge date: 01/21/2016  Time spent: 30 minutes  Recommendations for Outpatient Follow-up:  1. Follow up with PCP in one week.    Discharge Diagnoses:  Principal Problem:   Atypical Chest pain Active Problems:   Essential hypertension   SCLERODERMA   DM type 2 (diabetes mellitus, type 2) (HCC)   Discharge Condition: improved.   Diet recommendation: low sodium diet/ carb modified diet.   Filed Weights   01/19/16 2337 01/20/16 0500 01/21/16 0500  Weight: 79.062 kg (174 lb 4.8 oz) 78.654 kg (173 lb 6.4 oz) 78.2 kg (172 lb 6.4 oz)    History of present illness:  Andrea Heath is a 70 y.o. female with a past medical history of diabetes, on oral agents, hypertension, scleroderma with Raynaud and who was in her usual state of health about a week ago when she started developing left-sided chest pain. She was admitted for evaluation of ACS.  Hospital Course:  Atypical chest pain: resolved.  myoview stress ordered. And is negative for inducible ischemia. HDL is 37 and LDL is 67. Cardiac enzymes is negative. EKG does not show any ischemic changes.   Chest pain probably from musculoskeletal  Vs heartburn.   Hypothyroidism: tsh is 1.9, resume synthroid.   GERD:  Resume protonix, increased to BID  Hypertension: Controlled.   Diabetes Mellitus: CBG (last 3)   Recent Labs  01/20/16 1647 01/20/16 2109 01/21/16 0758  GLUCAP 162* 137* 172*    hgba1c pending. Follow up with PCP re the results.  Resume glipizide on discharge.   Procedures:  Myoview stress test.  Consultations: none Discharge Exam: Filed Vitals:   01/21/16 0500 01/21/16 0954  BP: 110/68 112/61  Pulse: 114   Temp: 98.8 F (37.1 C)   Resp: 20     General: alert comfortable Cardiovascular: s1s2 Respiratory: ctab  Discharge  Instructions   Discharge Instructions    Diet - low sodium heart healthy    Complete by:  As directed      Discharge instructions    Complete by:  As directed   Follow up with PCP in one week.          Discharge Medication List as of 01/21/2016 11:59 AM    CONTINUE these medications which have CHANGED   Details  omeprazole (PRILOSEC) 20 MG capsule Take 2 capsules (40 mg total) by mouth daily., Starting 01/21/2016, Until Discontinued, Print      CONTINUE these medications which have NOT CHANGED   Details  alendronate (FOSAMAX) 70 MG tablet Take 1 tablet by mouth every Monday., Starting 11/08/2015, Until Discontinued, Historical Med    amLODipine (NORVASC) 5 MG tablet Take 1 tablet (5 mg total) by mouth daily., Starting 11/22/2014, Until Discontinued, Normal    aspirin (BAYER ASPIRIN) 325 MG tablet Take 325 mg by mouth daily.  , Until Discontinued, Historical Med    atorvastatin (LIPITOR) 20 MG tablet TAKE 1 TABLET BY MOUTH DAILY., Normal    calcium-vitamin D (OSCAL WITH D) 500-200 MG-UNIT tablet Take 1 tablet by mouth daily., Until Discontinued, Historical Med    fluticasone (FLONASE) 50 MCG/ACT nasal spray USE 2 SPRAYS IN EACH NOSTRIL ONCE A DAY, Normal    glipiZIDE (GLUCOTROL XL) 5 MG 24 hr tablet TAKE 1 TABLET BY MOUTH ONCE DAILY, Normal    levothyroxine (SYNTHROID, LEVOTHROID) 75 MCG tablet TAKE 1 TABLET (75 MCG TOTAL) BY MOUTH  DAILY., Normal    lisinopril (PRINIVIL,ZESTRIL) 5 MG tablet Take 1 tablet (5 mg total) by mouth daily., Starting 11/22/2014, Until Discontinued, Normal    Lutein 20 MG CAPS Take 1 capsule by mouth daily., Until Discontinued, Historical Med       No Known Allergies    The results of significant diagnostics from this hospitalization (including imaging, microbiology, ancillary and laboratory) are listed below for reference.    Significant Diagnostic Studies: Dg Chest 2 View  01/19/2016  CLINICAL DATA:  Chest pain. EXAM: CHEST  2 VIEW COMPARISON:   01/14/2016 FINDINGS: Cardiomediastinal silhouette is normal. Mediastinal contours appear intact. Tortuosity and atherosclerotic disease of the aorta are noted. There is no evidence of focal airspace consolidation, pleural effusion or pneumothorax. Osseous structures are without acute abnormality. Ankylosing flowing right-sided thoracic osteophytes are consistent with the diffuse idiopathic skeletal hyperostosis. Cholecystectomy clips are seen. Soft tissues are otherwise grossly normal. IMPRESSION: No active cardiopulmonary disease. Atherosclerotic disease of the aorta. Electronically Signed   By: Ted Mcalpine M.D.   On: 01/19/2016 20:18   Dg Chest 2 View  01/14/2016  CLINICAL DATA:  Near syncope. Dizziness. Diaphoresis. Weakness. Scleroderma. EXAM: CHEST  2 VIEW COMPARISON:  None. FINDINGS: The heart size and mediastinal contours are within normal limits. Both lungs are clear. No evidence of pneumothorax or pleural effusion. Thoracic spine degenerative changes noted. IMPRESSION: No active cardiopulmonary disease. Electronically Signed   By: Myles Rosenthal M.D.   On: 01/14/2016 09:41   Ct Head Wo Contrast  01/14/2016  CLINICAL DATA:  Transient episode of weakness this A.M. No injury EXAM: CT HEAD WITHOUT CONTRAST TECHNIQUE: Contiguous axial images were obtained from the base of the skull through the vertex without intravenous contrast. COMPARISON:  12/25/2006. FINDINGS: No evidence for acute infarction, hemorrhage, mass lesion, hydrocephalus, or extra-axial fluid. Normal for age cerebral volume. Minor hypoattenuation of white matter could represent mild small vessel disease. Calvarium intact. No sinus or mastoid disease. Mild vascular calcification. IMPRESSION: Normal for age cerebral volume. No acute intracranial findings. Similar appearance to priors. Electronically Signed   By: Elsie Stain M.D.   On: 01/14/2016 12:04   Nm Myocar Multi W/spect W/wall Motion / Ef  01/20/2016  CLINICAL DATA:  Chest  pain. EXAM: MYOCARDIAL IMAGING WITH SPECT (REST AND EXERCISE) GATED LEFT VENTRICULAR WALL MOTION STUDY LEFT VENTRICULAR EJECTION FRACTION TECHNIQUE: Standard myocardial SPECT imaging was performed after resting intravenous injection of 10 mCi Tc-68m sestamibi. Subsequently, exercise tolerance test was performed by the patient under the supervision of the Cardiology staff. At peak-stress, 30 mCi Tc-20m sestamibi was injected intravenously and standard myocardial SPECT imaging was performed. Quantitative gated imaging was also performed to evaluate left ventricular wall motion, and estimate left ventricular ejection fraction. COMPARISON:  None. FINDINGS: Perfusion: No decreased activity in the left ventricle on stress imaging to suggest reversible ischemia or infarction. The patient was exercised on a treadmill and completed stage II of Bruce protocol. Maximal heart rate of 148 beats per minute is 98% of predicted maximum. No abnormal heart rate or blood pressure response to exercise. Wall Motion: Normal left ventricular wall motion. No left ventricular dilation. Left Ventricular Ejection Fraction: 79 % End diastolic volume 38 ml End systolic volume 8 ml IMPRESSION: 1. No inducible myocardial ischemia or evidence of infarction. 2. Normal left ventricular wall motion. 3. Left ventricular ejection fraction 79% 4. Low-risk stress test findings*. *2012 Appropriate Use Criteria for Coronary Revascularization Focused Update: J Am Coll Cardiol. 2012;59(9):857-881. http://content.dementiazones.com.aspx?articleid=[email or web address] Electronically Signed  By: Irish Lack M.D.   On: 01/20/2016 17:48    Microbiology: No results found for this or any previous visit (from the past 240 hour(s)).   Labs: Basic Metabolic Panel:  Recent Labs Lab 01/19/16 1955  NA 141  K 3.7  CL 108  CO2 21*  GLUCOSE 129*  BUN 9  CREATININE 0.85  CALCIUM 9.0   Liver Function Tests: No results for input(s): AST, ALT, ALKPHOS,  BILITOT, PROT, ALBUMIN in the last 168 hours. No results for input(s): LIPASE, AMYLASE in the last 168 hours. No results for input(s): AMMONIA in the last 168 hours. CBC:  Recent Labs Lab 01/19/16 1955  WBC 6.9  NEUTROABS 5.0  HGB 13.6  HCT 39.4  MCV 87.2  PLT 197   Cardiac Enzymes:  Recent Labs Lab 01/20/16 0041 01/20/16 1430 01/20/16 1920  TROPONINI <0.03 <0.03 <0.03   BNP: BNP (last 3 results) No results for input(s): BNP in the last 8760 hours.  ProBNP (last 3 results) No results for input(s): PROBNP in the last 8760 hours.  CBG:  Recent Labs Lab 01/19/16 2359 01/20/16 0736 01/20/16 1647 01/20/16 2109 01/21/16 0758  GLUCAP 173* 146* 162* 137* 172*       Signed:  Wynee Matarazzo MD.  [facility] 01/22/2016, 12:54 AM

## 2016-04-08 ENCOUNTER — Other Ambulatory Visit: Payer: Self-pay

## 2016-04-08 DIAGNOSIS — Z1231 Encounter for screening mammogram for malignant neoplasm of breast: Secondary | ICD-10-CM

## 2016-04-09 ENCOUNTER — Other Ambulatory Visit: Payer: Self-pay | Admitting: Family Medicine

## 2016-04-09 NOTE — Telephone Encounter (Signed)
Med denied, pt is listed as having Andrea Heath as PCP

## 2016-04-22 ENCOUNTER — Ambulatory Visit
Admission: RE | Admit: 2016-04-22 | Discharge: 2016-04-22 | Disposition: A | Discharge status: Home / Self Care | Payer: Medicare PPO | Source: Ambulatory Visit

## 2016-04-22 DIAGNOSIS — Z1231 Encounter for screening mammogram for malignant neoplasm of breast: Secondary | ICD-10-CM

## 2016-06-28 LAB — CBC AND DIFFERENTIAL
HEMATOCRIT: 41 % (ref 36–46)
Hemoglobin: 13.6 g/dL (ref 12.0–16.0)
Platelets: 186 10*3/uL (ref 150–399)
WBC: 5.3 10^3/mL

## 2016-06-28 LAB — BASIC METABOLIC PANEL
BUN: 10 mg/dL (ref 4–21)
CREATININE: 1 mg/dL (ref 0.5–1.1)
GLUCOSE: 219 mg/dL
Potassium: 4.8 mmol/L (ref 3.4–5.3)
SODIUM: 139 mmol/L (ref 137–147)

## 2016-06-28 LAB — HEPATIC FUNCTION PANEL
ALT: 27 U/L (ref 7–35)
AST: 19 U/L (ref 13–35)
Alkaline Phosphatase: 50 U/L (ref 25–125)
BILIRUBIN, TOTAL: 0.5 mg/dL

## 2016-10-29 ENCOUNTER — Other Ambulatory Visit: Payer: Self-pay | Admitting: Rheumatology

## 2016-10-29 NOTE — Telephone Encounter (Signed)
Last visit 08/25/16 Next visit 11/12/16 Labs 06/28/16 WNL Ok to refill per Dr Corliss Skainseveshwar

## 2016-11-09 DIAGNOSIS — K219 Gastro-esophageal reflux disease without esophagitis: Secondary | ICD-10-CM | POA: Insufficient documentation

## 2016-11-09 DIAGNOSIS — M810 Age-related osteoporosis without current pathological fracture: Secondary | ICD-10-CM | POA: Insufficient documentation

## 2016-11-09 DIAGNOSIS — M1A0790 Idiopathic chronic gout, unspecified ankle and foot, without tophus (tophi): Secondary | ICD-10-CM

## 2016-11-09 DIAGNOSIS — E039 Hypothyroidism, unspecified: Secondary | ICD-10-CM | POA: Insufficient documentation

## 2016-11-09 HISTORY — DX: Idiopathic chronic gout, unspecified ankle and foot, without tophus (tophi): M1A.0790

## 2016-11-09 HISTORY — DX: Hypothyroidism, unspecified: E03.9

## 2016-11-09 HISTORY — DX: Gastro-esophageal reflux disease without esophagitis: K21.9

## 2016-11-09 NOTE — Progress Notes (Deleted)
Office Visit Note  Patient: Andrea EndsBrenda W Grandt             Date of Birth: 08/14/1946           MRN: [id number]             PCP: Clayborn HeronVictoria R Rankins, MD Referring: Clayborn Heronankins, [name], MD Visit Date: 11/12/2016 Occupation: @GUAROCC @    Subjective:  No chief complaint on file.   History of Present Illness: Andrea Heath is a 70 y.o. female ***   Activities of Daily Living:  Patient reports morning stiffness for *** {minute/hour:19697}.   Patient {ACTIONS;DENIES/REPORTS:21021675::"Denies"} nocturnal pain.  Difficulty dressing/grooming: {ACTIONS;DENIES/REPORTS:21021675::"Denies"} Difficulty climbing stairs: {ACTIONS;DENIES/REPORTS:21021675::"Denies"} Difficulty getting out of chair: {ACTIONS;DENIES/REPORTS:21021675::"Denies"} Difficulty using hands for taps, buttons, cutlery, and/or writing: {ACTIONS;DENIES/REPORTS:21021675::"Denies"}   No Rheumatology ROS completed.   PMFS History:  Patient Active Problem List   Diagnosis Date Noted  . Chronic gout of foot 11/09/2016  . Age related osteoporosis 11/09/2016  . Hypothyroidism 11/09/2016  . Chest pain 01/19/2016  . DM type 2 (diabetes mellitus, type 2) (HCC) 01/19/2016  . Leg cramp 12/12/2014  . Pain, joint, multiple sites 09/08/2012  . Night sweats 06/04/2012  . Eyelid dermatitis, eczematous 04/30/2012  . General medical examination 12/03/2011  . URI 11/14/2010  . FOOT, PAIN 04/19/2010  . SINUSITIS 12/12/2009  . HYPERKALEMIA 11/27/2009  . SKIN LESION 07/27/2009  . LEG CRAMPS, NOCTURNAL 01/26/2009  . DEPRESSIVE DISORDER 10/20/2008  . SCLERODERMA 07/11/2008  . SEBORRHEIC KERATOSIS 03/08/2008  . RHINITIS 12/02/2007  . DIABETES MELLITUS, TYPE II 01/21/2007  . HYPERLIPIDEMIA 01/21/2007  . Essential hypertension 01/21/2007    Past Medical History:  Diagnosis Date  . Diabetes mellitus   . Hyperlipidemia   . Hypertension   . Keratosis seborrheica   . Macular degeneration of both eyes   . Rhinitis   . Scleroderma  (HCC)     Family History  Problem Relation Age of Onset  . Stroke Mother   . Heart attack Mother   . Cancer Mother     breast cancer  . Lung cancer Father   . Achalasia Brother   . Arrhythmia Brother   . Arrhythmia Brother    Past Surgical History:  Procedure Laterality Date  . CHOLECYSTECTOMY    . MASTECTOMY PARTIAL / LUMPECTOMY     left breast   . TUBAL LIGATION     Social History   Social History Narrative  . No narrative on file     Objective: Vital Signs: There were no vitals taken for this visit.   Physical Exam   Musculoskeletal Exam: ***  CDAI Exam: No CDAI exam completed.    Investigation: Findings:  06/28/2016 CMP creatinine 1.00 GFR 65, CBC normal, uric acid 5.4    Imaging: No results found.  Speciality Comments: No specialty comments available.    Procedures:  No procedures performed Allergies: Patient has no known allergies.   Assessment / Plan: Visit Diagnoses: SCLERODERMA - + ANA (Centromere), sclerodactyly, Raynauds, Telengectesia,small oral procedure, + RF,+ CCP  Chronic gout of foot, unspecified cause, unspecified laterality  Age related osteoporosis, unspecified pathological fracture presence  Essential hypertension  Type 2 diabetes mellitus without complication, without long-term current use of insulin (HCC)  Hypothyroidism, unspecified type    Orders: No orders of the defined types were placed in this encounter.  No orders of the defined types were placed in this encounter.   Face-to-face time spent with patient was *** minutes. 50% of time was spent in  counseling and coordination of care.  Follow-Up Instructions: No Follow-up on file.   Pollyann SavoyShaili Jhostin Epps, MD

## 2016-11-11 ENCOUNTER — Other Ambulatory Visit: Payer: Self-pay | Admitting: Radiology

## 2016-11-11 ENCOUNTER — Encounter: Payer: Self-pay | Admitting: *Deleted

## 2016-11-11 DIAGNOSIS — M2550 Pain in unspecified joint: Secondary | ICD-10-CM

## 2016-11-11 DIAGNOSIS — Z79899 Other long term (current) drug therapy: Secondary | ICD-10-CM

## 2016-11-11 DIAGNOSIS — M349 Systemic sclerosis, unspecified: Secondary | ICD-10-CM

## 2016-11-11 LAB — CBC WITH DIFFERENTIAL/PLATELET
BASOS ABS: 0 {cells}/uL (ref 0–200)
Basophils Relative: 0 %
EOS ABS: 70 {cells}/uL (ref 15–500)
Eosinophils Relative: 1 %
HCT: 39.6 % (ref 35.0–45.0)
Hemoglobin: 13.4 g/dL (ref 11.7–15.5)
LYMPHS PCT: 36 %
Lymphs Abs: 2520 cells/uL (ref 850–3900)
MCH: 31.4 pg (ref 27.0–33.0)
MCHC: 33.8 g/dL (ref 32.0–36.0)
MCV: 92.7 fL (ref 80.0–100.0)
MONOS PCT: 8 %
MPV: 12.1 fL (ref 7.5–12.5)
Monocytes Absolute: 560 cells/uL (ref 200–950)
NEUTROS PCT: 55 %
Neutro Abs: 3850 cells/uL (ref 1500–7800)
PLATELETS: 256 10*3/uL (ref 140–400)
RBC: 4.27 MIL/uL (ref 3.80–5.10)
RDW: 14.8 % (ref 11.0–15.0)
WBC: 7 10*3/uL (ref 3.8–10.8)

## 2016-11-12 ENCOUNTER — Ambulatory Visit (INDEPENDENT_AMBULATORY_CARE_PROVIDER_SITE_OTHER): Payer: Medicare PPO | Admitting: Rheumatology

## 2016-11-12 ENCOUNTER — Telehealth: Payer: Self-pay | Admitting: Radiology

## 2016-11-12 ENCOUNTER — Encounter: Payer: Self-pay | Admitting: Rheumatology

## 2016-11-12 ENCOUNTER — Ambulatory Visit: Payer: Medicare PPO | Admitting: Rheumatology

## 2016-11-12 VITALS — BP 128/64 | HR 80 | Resp 14 | Ht 67.0 in | Wt 173.0 lb

## 2016-11-12 DIAGNOSIS — M4004 Postural kyphosis, thoracic region: Secondary | ICD-10-CM

## 2016-11-12 DIAGNOSIS — E790 Hyperuricemia without signs of inflammatory arthritis and tophaceous disease: Secondary | ICD-10-CM | POA: Diagnosis not present

## 2016-11-12 DIAGNOSIS — E785 Hyperlipidemia, unspecified: Secondary | ICD-10-CM | POA: Diagnosis not present

## 2016-11-12 DIAGNOSIS — I10 Essential (primary) hypertension: Secondary | ICD-10-CM

## 2016-11-12 DIAGNOSIS — E119 Type 2 diabetes mellitus without complications: Secondary | ICD-10-CM | POA: Diagnosis not present

## 2016-11-12 DIAGNOSIS — M810 Age-related osteoporosis without current pathological fracture: Secondary | ICD-10-CM | POA: Diagnosis not present

## 2016-11-12 DIAGNOSIS — K219 Gastro-esophageal reflux disease without esophagitis: Secondary | ICD-10-CM

## 2016-11-12 DIAGNOSIS — M349 Systemic sclerosis, unspecified: Secondary | ICD-10-CM | POA: Diagnosis not present

## 2016-11-12 LAB — COMPLETE METABOLIC PANEL WITH GFR
ALT: 24 U/L (ref 6–29)
AST: 23 U/L (ref 10–35)
Albumin: 4 g/dL (ref 3.6–5.1)
Alkaline Phosphatase: 56 U/L (ref 33–130)
BILIRUBIN TOTAL: 0.7 mg/dL (ref 0.2–1.2)
BUN: 12 mg/dL (ref 7–25)
CO2: 25 mmol/L (ref 20–31)
Calcium: 10 mg/dL (ref 8.6–10.4)
Chloride: 105 mmol/L (ref 98–110)
Creat: 1.08 mg/dL — ABNORMAL HIGH (ref 0.60–0.93)
GFR, EST NON AFRICAN AMERICAN: 52 mL/min — AB (ref >=60)
GFR, Est African American: 60 mL/min (ref >=60)
Glucose, Bld: 109 mg/dL — ABNORMAL HIGH (ref 65–99)
Potassium: 4.1 mmol/L (ref 3.5–5.3)
Sodium: 139 mmol/L (ref 135–146)
TOTAL PROTEIN: 7.2 g/dL (ref 6.1–8.1)

## 2016-11-12 LAB — URINALYSIS, ROUTINE W REFLEX MICROSCOPIC
Bilirubin Urine: NEGATIVE
Glucose, UA: NEGATIVE
HGB URINE DIPSTICK: NEGATIVE
KETONES UR: NEGATIVE
NITRITE: NEGATIVE
PROTEIN: NEGATIVE
SPECIFIC GRAVITY, URINE: 1.008 (ref 1.001–1.035)
pH: 5 (ref 5.0–8.0)

## 2016-11-12 LAB — URIC ACID: Uric Acid, Serum: 4.5 mg/dL (ref 2.5–7.0)

## 2016-11-12 LAB — URINALYSIS, MICROSCOPIC ONLY
Bacteria, UA: NONE SEEN [HPF]
CRYSTALS: NONE SEEN [HPF]
Casts: NONE SEEN [LPF]
RBC / HPF: NONE SEEN RBC/HPF (ref <=2)
Yeast: NONE SEEN [HPF]

## 2016-11-12 NOTE — Patient Instructions (Signed)
Please monitor your blood pressure on regular basis. Cardiologist Dr. Gala RomneyBensimhon.

## 2016-11-12 NOTE — Progress Notes (Signed)
stable °

## 2016-11-12 NOTE — Progress Notes (Signed)
Office Visit Note  Patient: Andrea Heath             Date of Birth: 09/10/1946           MRN: [id number]             PCP: Clayborn HeronVictoria R Rankins, MD Referring: Clayborn Heronankins, [name], MD Visit Date: 11/12/2016 Occupation: @GUAROCC @    Subjective: Right second finger pain  History of Present Illness: Andrea Heath is a [age] y.o. right-handed female  with scleroderma. She's been having increased pain and discomfort in her right second finger. She also states that this winter she is having increased problems with Raynauds. She has not noticed any increased tightness in her skin. There is no episodes of gout. She's been taking Fosamax on regular basis and has been tolerating it well.  Activities of Daily Living:  Patient reports morning stiffness for 2 minutes.   Patient Denies nocturnal pain.  Difficulty dressing/grooming: Denies Difficulty climbing stairs: Denies Difficulty getting out of chair: Denies Difficulty using hands for taps, buttons, cutlery, and/or writing: Denies   Review of Systems  Constitutional: Negative for fatigue, night sweats, weight gain, weight loss and weakness.  HENT: Negative for mouth sores, trouble swallowing, trouble swallowing, mouth dryness and nose dryness.   Eyes: Negative for pain, redness, visual disturbance and dryness.  Respiratory: Negative for cough, shortness of breath and difficulty breathing.   Cardiovascular: Negative for chest pain, palpitations, hypertension, irregular heartbeat and swelling in legs/feet.  Gastrointestinal: Negative for blood in stool, constipation and diarrhea.  Endocrine: Negative for increased urination.  Genitourinary: Negative for vaginal dryness.  Musculoskeletal: Positive for arthralgias and joint pain. Negative for joint swelling, myalgias, muscle weakness, morning stiffness, muscle tenderness and myalgias.  Skin: Positive for color change and ulcers. Negative for rash, hair loss, skin tightness and sensitivity to  sunlight.  Allergic/Immunologic: Negative for susceptible to infections.  Neurological: Positive for memory loss. Negative for dizziness and night sweats.  Hematological: Negative for swollen glands.  Psychiatric/Behavioral: Negative for depressed mood and sleep disturbance. The patient is not nervous/anxious.     PMFS History:  Patient Active Problem List   Diagnosis Date Noted  . 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 Chronic gout of foot 11/09/2016  . Age related osteoporosis 11/09/2016  . Hypothyroidism 11/09/2016  . Gastroesophageal reflux 11/09/2016  . Chest pain 01/19/2016  . DM type 2 (diabetes mellitus, type 2) (HCC) 01/19/2016  . Leg cramp 12/12/2014  . Pain, joint, multiple sites 09/08/2012  . Night sweats 06/04/2012  . Eyelid dermatitis, eczematous 04/30/2012  . General medical examination 12/03/2011  . URI 11/14/2010  . FOOT, PAIN 04/19/2010  . SINUSITIS 12/12/2009  . HYPERKALEMIA 11/27/2009  . SKIN LESION 07/27/2009  . LEG CRAMPS, NOCTURNAL 01/26/2009  . DEPRESSIVE DISORDER 10/20/2008  . SCLERODERMA 07/11/2008  . SEBORRHEIC KERATOSIS 03/08/2008  . RHINITIS 12/02/2007  . DIABETES MELLITUS, TYPE II 01/21/2007  . Dyslipidemia 01/21/2007  . Essential hypertension 01/21/2007    Past Medical History:  Diagnosis Date  . Diabetes mellitus   . GERD (gastroesophageal reflux disease)   . Gout   . Hyperlipidemia   . Hypertension   . Keratosis seborrheica   . Macular degeneration of both eyes   . Osteoporosis   . Raynaud's disease   . Rhinitis   . Scleroderma (HCC)     Family History  Problem Relation Age of Onset  . Stroke Mother   . Heart attack Mother   . Cancer Mother  breast cancer  . Lung cancer Father   . Achalasia Brother   . Arrhythmia Brother   . Arrhythmia Brother    Past Surgical History:  Procedure Laterality Date  . CHOLECYSTECTOMY    . MASTECTOMY PARTIAL / LUMPECTOMY     left breast   . TUBAL LIGATION     Social History   Social History  Narrative  . No narrative on file     Objective: Vital Signs: BP 128/64 (BP Location: Left Arm, Patient Position: Sitting, Cuff Size: Large)   Pulse 80   Resp 14   Ht 5\' 7"  (1.702 m)   Wt 173 lb (78.5 kg)   BMI 27.10 kg/m    Physical Exam  Constitutional: She is oriented to person, place, and time. She appears well-developed and well-nourished.  HENT:  Head: Normocephalic and atraumatic.  Eyes: Conjunctivae and EOM are normal.  Neck: Normal range of motion.  Cardiovascular: Normal rate, regular rhythm, normal heart sounds and intact distal pulses.   Pulmonary/Chest: Effort normal and breath sounds normal.  Abdominal: Soft. Bowel sounds are normal.  Lymphadenopathy:    She has no cervical adenopathy.  Neurological: She is alert and oriented to person, place, and time.  Skin: Skin is warm and dry. Capillary refill takes less than 2 seconds.  Sclerodactyly noted in bilateral hands. She has incomplete fist formation due to that. His skin tightness is noted below elbow. Several telangiectasias were noted. Decreased oral aperture noted. Hands were warm to touch. Nail bed capillary changes noted. No digital ulcers were noted.  Psychiatric: She has a normal mood and affect. Her behavior is normal.  Nursing note and vitals reviewed.    Musculoskeletal Exam: C-spine good range of motion, she has marked thoracic kyphosis with limited range of motion. Shoulder joints elbow joints wrist joints are good range of motion MCPs with good range of motion, she has thickening of PIP joints. 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 Hip joints, knee joints, ankle joints good range of motion, no ulcers on toes.  CDAI Exam: No CDAI exam completed.    Investigation: Findings:  06/28/2016 CMP creatinine 1.0 GFR 57, CBC normal, uric acid 5.4, labs from 11/11/2016 CBC normal, CMP showed GFR 52, UA negative, uric acid 4.5    Imaging: No results found.  Speciality Comments: No specialty  comments available.    Procedures:  No procedures performed Allergies: Patient has no known allergies.   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 There is some increase the skin tightness in her right hand with discomfort in her right second digit. Her Raynaud's is more active as well. Positive ANA centromere, sclerodactyly, Raynaud's, telangiectasias, positive RF, positive CCP. There is no features of synovitis on examination. I will check CBC, CMP with GFR and uric acid again in 6 months. I think calcium channel blocker will be helpful for her Raynauds. As she is taking lisinopril I did not want to add another medication. I would like her to discuss that further with her PCP. Even a low-dose Norvasc at 2.5 mg may benefit. She's taking aspirin for right now. Advised to schedule an appointment with her cardiologist for yearly examination.  We had discussion regarding cardiology referral to rule out pulmonary hypertension. She states she has seen a cardiologist once and she will schedule appointment with him. I will refer her to pulmonary for PFTs and evaluation.  Hyperuricemia uric acid is well controlled. She is on allopurinol 300 mg  a day and colchicine prn.  Age-related osteoporosis  - July 2016 T score -2.5 left femoral neck, on Fosamax next DEXA will be due in July 2018  Postural kyphosis of thoracic region: Posture correction and stretching exercises were discussed.  Essential hypertension: Blood pressure is well controlled.  She has following medical problems for which she's been seeing PCP.  Type 2 diabetes mellitus without complication, without long-term current use of insulin (HCC)  Dyslipidemia  Gastroesophageal reflux disease without esophagitis    Orders: Orders Placed This Encounter  Procedures  . CBC with Differential/Platelet  . COMPLETE METABOLIC PANEL WITH GFR  . Urinalysis, Routine w reflex microscopic (not  at [facility])  . Uric acid   No orders of the defined types were placed in this encounter.   Face-to-face time spent with patient was 30 minutes. 50% of time was spent in counseling and coordination of care.  Follow-Up Instructions: Return in about 6 months (around 05/12/2017) for Scleroderma.   Pollyann SavoyShaili Monique Gift, MD

## 2016-11-12 NOTE — Telephone Encounter (Signed)
Called patient to advise labs stable  Discussed UA results, she will discuss with PCP if she develops symptoms

## 2016-11-12 NOTE — Telephone Encounter (Signed)
-----   Message from Pollyann SavoyShaili Deveshwar, MD sent at 11/12/2016 12:35 PM EST ----- stable

## 2016-11-14 ENCOUNTER — Telehealth (INDEPENDENT_AMBULATORY_CARE_PROVIDER_SITE_OTHER): Payer: Self-pay | Admitting: Rheumatology

## 2016-11-14 NOTE — Telephone Encounter (Signed)
Thank you I did advise her.

## 2016-11-14 NOTE — Telephone Encounter (Signed)
Looks like Dr Jones BroomBensihmon saw her then, called her to advise She states she does not want to see cardiology, she would like to discuss with primary care, she has appt with primary care next week on Wed, [facility]

## 2016-11-14 NOTE — Telephone Encounter (Signed)
[facility] in January with symptoms of chest pain.  Patient states they did a stress test and ran other tests as well. When she was at your office this past Tuesday you had mentioned that she needed to see a Cardiologist.  She was seen by the Cardiologist when she was admitted those 3 days, but she cannot think of the doctor's name. Please call an advise as to her seeing him again.

## 2016-11-14 NOTE — Telephone Encounter (Signed)
She should see Dr. Gala RomneyBensimhon every year.

## 2016-11-18 ENCOUNTER — Other Ambulatory Visit: Payer: Self-pay | Admitting: Rheumatology

## 2016-11-18 DIAGNOSIS — Z79899 Other long term (current) drug therapy: Secondary | ICD-10-CM

## 2016-11-18 NOTE — Telephone Encounter (Signed)
08/23/16 last visit  Labs WNL 11/11/16 Next visit 05/13/17 Ok to refill per Mr Leane Callanwala

## 2016-12-12 ENCOUNTER — Other Ambulatory Visit: Payer: Self-pay | Admitting: Rheumatology

## 2016-12-12 NOTE — Telephone Encounter (Signed)
Last Visit: 11/12/16 Next visit: 05/13/17 Labs: 11/12/16 Stable  Okay to refill Allopurinol?

## 2016-12-12 NOTE — Telephone Encounter (Signed)
ok 

## 2016-12-17 ENCOUNTER — Ambulatory Visit: Payer: Medicare PPO | Admitting: Rheumatology

## 2017-03-06 ENCOUNTER — Other Ambulatory Visit: Payer: Self-pay | Admitting: Rheumatology

## 2017-03-06 NOTE — Telephone Encounter (Signed)
Last Visit: 11/12/16 Next visit: 05/13/17 Labs: 11/12/16 Stable  Okay to refill Colchicine?

## 2017-03-13 ENCOUNTER — Telehealth: Payer: Self-pay | Admitting: Pharmacist

## 2017-03-13 NOTE — Telephone Encounter (Signed)
Received an alert from patient's insurance regarding possible drug interaction between atorvastatin and colchicine.  The combined use of these medications may increase the risk for myopathy.    I called patient to discuss.  Patient reports she has not had any adverse effects with the combined use at this time.  Advised her to let us know if she develops muscle pain.  Patient reports she is taking colchicine once daily and is taking allopurinol 300 mg daily.  Reviewed patient's chart.  She has been on allopurinol since May 2017.  Her most recent uric acid was well controlled at 4.5 on 11/11/2016.  Office visit on 11/12/17 lists colchicine as prn.  Advised patient she can take colchicine as needed for gout flare.  Patient voiced understanding and denies any questions.    Lilla Shookachel Alara Daniel, Pharm.D., BCPS, CPP Clinical Pharmacist Pager: [phone] Phone: [phone] 03/13/2017 3:33 PM

## 2017-03-18 ENCOUNTER — Other Ambulatory Visit: Payer: Self-pay | Admitting: Internal Medicine

## 2017-03-18 ENCOUNTER — Other Ambulatory Visit: Payer: Self-pay | Admitting: Family Medicine

## 2017-03-18 DIAGNOSIS — Z1231 Encounter for screening mammogram for malignant neoplasm of breast: Secondary | ICD-10-CM

## 2017-03-29 ENCOUNTER — Other Ambulatory Visit: Payer: Self-pay | Admitting: Rheumatology

## 2017-04-01 NOTE — Telephone Encounter (Signed)
Last Visit: 11/12/16 Next visit: 05/13/17 Labs: 11/12/16 Stable  Okay to refill Fosamax?

## 2017-04-01 NOTE — Telephone Encounter (Signed)
ok 

## 2017-04-29 ENCOUNTER — Ambulatory Visit
Admission: RE | Admit: 2017-04-29 | Discharge: 2017-04-29 | Disposition: A | Discharge status: Home / Self Care | Payer: Medicare PPO | Source: Ambulatory Visit | Attending: Family Medicine | Admitting: Family Medicine

## 2017-04-29 DIAGNOSIS — Z1231 Encounter for screening mammogram for malignant neoplasm of breast: Secondary | ICD-10-CM

## 2017-05-07 NOTE — Progress Notes (Signed)
Office Visit Note  Patient: Andrea Heath             Date of Birth: 04/05/1946           MRN: [id number]             PCP: Clayborn Heronankins, [name], MD Referring: Clayborn Heronankins, [name], MD Visit Date: 05/13/2017 Occupation: @GUAROCC @    Subjective:  raynauds  History of Present Illness: Andrea Heath is a 71 y.o. female with history of limited systemic sclerosis. She continues to have some problems with Raynauds. Which she usually uses warm clothing to protect herself. She denies any increased tightness of her skin. She denies any shortness of breath. There is no history of joint swelling. She has not had a gout flare since her last visit.  Activities of Daily Living:  Patient reports morning stiffness for 1 minutes.   Patient Denies nocturnal pain.  Difficulty dressing/grooming: Denies Difficulty climbing stairs: Denies Difficulty getting out of chair: Denies Difficulty using hands for taps, buttons, cutlery, and/or writing: Denies   Review of Systems  Constitutional: Negative for fatigue, night sweats, weight gain, weight loss and weakness.  HENT: Negative for mouth sores, mouth dryness and nose dryness.   Eyes: Negative for pain, redness, itching, visual disturbance and dryness.  Respiratory: Negative for cough, shortness of breath and difficulty breathing.   Cardiovascular: Negative for chest pain, palpitations, hypertension, irregular heartbeat and swelling in legs/feet.  Gastrointestinal: Negative for blood in stool, constipation and diarrhea.  Endocrine: Negative for increased urination.  Genitourinary: Negative for painful urination and vaginal dryness.  Musculoskeletal: Positive for joint swelling. Negative for arthralgias, joint pain, myalgias, morning stiffness, muscle tenderness and myalgias.  Skin: Positive for color change, redness, skin tightness and sensitivity to sunlight. Negative for rash, nodules/bumps and ulcers.  Allergic/Immunologic: Negative for susceptible  to infections.  Neurological: Negative for dizziness, headaches, memory loss and night sweats.  Hematological: Negative for swollen glands.  Psychiatric/Behavioral: Negative for depressed mood and sleep disturbance. The patient is not nervous/anxious.     PMFS History:  Patient Active Problem List   Diagnosis Date Noted  . 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  . 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 Chronic gout of foot 11/09/2016  . Age related osteoporosis 11/09/2016  . Hypothyroidism 11/09/2016  . Gastroesophageal reflux 11/09/2016  . Chest pain 01/19/2016  . DM type 2 (diabetes mellitus, type 2) (HCC) 01/19/2016  . Leg cramp 12/12/2014  . Pain, joint, multiple sites 09/08/2012  . Night sweats 06/04/2012  . Eyelid dermatitis, eczematous 04/30/2012  . General medical examination 12/03/2011  . URI 11/14/2010  . FOOT, PAIN 04/19/2010  . SINUSITIS 12/12/2009  . HYPERKALEMIA 11/27/2009  . SKIN LESION 07/27/2009  . LEG CRAMPS, NOCTURNAL 01/26/2009  . DEPRESSIVE DISORDER 10/20/2008  . SCLERODERMA 07/11/2008  . SEBORRHEIC KERATOSIS 03/08/2008  . RHINITIS 12/02/2007  . DIABETES MELLITUS, TYPE II 01/21/2007  . Dyslipidemia 01/21/2007  . Essential hypertension 01/21/2007    Past Medical History:  Diagnosis Date  . Diabetes mellitus   . GERD (gastroesophageal reflux disease)   . Gout   . Hyperlipidemia   . Hypertension   . Keratosis seborrheica   . Macular degeneration of both eyes   . Osteoporosis   . Raynaud's disease   . Rhinitis   . Scleroderma (HCC)     Family History  Problem Relation Age of Onset  . Stroke Mother   . Heart attack Mother   . Cancer Mother  breast cancer  . Breast cancer Mother 58  . Lung cancer Father   . Achalasia Brother   . Arrhythmia Brother   . Arrhythmia Brother    Past Surgical History:  Procedure Laterality Date  . BREAST EXCISIONAL BIOPSY Left 2005  . CHOLECYSTECTOMY    . MASTECTOMY PARTIAL / LUMPECTOMY     left breast     . TUBAL LIGATION     Social History   Social History Narrative  . No narrative on file     Objective: Vital Signs: BP 138/74   Pulse 80   Resp 14   Wt 176 lb (79.8 kg)   BMI 27.57 kg/m    Physical Exam  Constitutional: She is oriented to person, place, and time. She appears well-developed and well-nourished.  HENT:  Head: Normocephalic and atraumatic.  Eyes: Conjunctivae and EOM are normal.  Neck: Normal range of motion.  Cardiovascular: Normal rate, regular rhythm, normal heart sounds and intact distal pulses.   Pulmonary/Chest: Effort normal and breath sounds normal.  Abdominal: Soft. Bowel sounds are normal.  Lymphadenopathy:    She has no cervical adenopathy.  Neurological: She is alert and oriented to person, place, and time.  Skin: Skin is warm and dry. Capillary refill takes 2 to 3 seconds.  His skin tightness below her elbows, lower extremities. Positive telangiectasias. Positive nail bed capillary changes. Mild digital cyanosis in her hands.  Psychiatric: She has a normal mood and affect. Her behavior is normal.  Nursing note and vitals reviewed.    Musculoskeletal Exam: C-spine and thoracic lumbar spine good range of motion. Shoulder joints elbow joints wrist joints good range of motion. She has some somewhat decrease some fist formation. Is sclerodactyly was noted. She is thickening of the skin below her elbow region. Thickening of the skin was also needs noted on her lower extremities. She is to inject as he is on her fingertips. Mild digital cyanosis was noted. No synovitis was noted.  CDAI Exam: No CDAI exam completed.    Investigation: Findings:   July 2016 T score -2.5 left femoral neck, on Fosamax next DEXA will be due in July 2018  October 2013:  CBC was normal, sed rate was normal, ANA was 1:280 centromere pattern, comprehensive metabolic and UA were normal.   Labs from 06/08/2013 show CMP with GFR normal except for slight elevation of glucose at  135.  The patient has a history of diabetes mellitus.  GFR is normal at 59.  CBC with diff is normal.  Lupus lupus anticoagulant negative, C3, C4 normal, ENA negative, urine normal, beta 2 glycoprotein normal, anticardiolipin enzyme normal.  CBC Latest Ref Rng & Units 11/11/2016 06/28/2016 01/19/2016  WBC 3.8 - 10.8 K/uL 7.0 5.3 6.9  Hemoglobin 11.7 - 15.5 g/dL 96.0 45.4 09.8  Hematocrit 35.0 - 45.0 % 39.6 41 39.4  Platelets 140 - 400 K/uL 256 186 197   CMP Latest Ref Rng & Units 11/11/2016 06/28/2016 01/19/2016  Glucose 65 - 99 mg/dL 119(J) - 478(G)  BUN 7 - 25 mg/dL 12 10 9   Creatinine 0.60 - 0.93 mg/dL 9.56(O) 1.0 1.30  Sodium 135 - 146 mmol/L 139 139 141  Potassium 3.5 - 5.3 mmol/L 4.1 4.8 3.7  Chloride 98 - 110 mmol/L 105 - 108  CO2 20 - 31 mmol/L 25 - 21(L)  Calcium 8.6 - 10.4 mg/dL 86.5 - 9.0  Total Protein 6.1 - 8.1 g/dL 7.2 - -  Total Bilirubin 0.2 - 1.2 mg/dL 0.7 - -  Alkaline Phos 33 - 130 U/L 56 50 -  AST 10 - 35 U/L 23 19 -  ALT 6 - 29 U/L 24 27 -     Imaging: Mm Digital Screening Bilateral  Result Date: 04/29/2017 CLINICAL DATA:  Screening. Prior benign excisional biopsy of the left breast in 2005. EXAM: DIGITAL SCREENING BILATERAL MAMMOGRAM WITH CAD COMPARISON:  Previous exam(s). ACR Breast Density Category b: There are scattered areas of fibroglandular density. FINDINGS: There are no findings suspicious for malignancy. Images were processed with CAD. IMPRESSION: No mammographic evidence of malignancy. A result letter of this screening mammogram will be mailed directly to the patient. RECOMMENDATION: Screening mammogram in one year. (Code:SM-B-01Y) BI-RADS CATEGORY  2: Benign. Electronically Signed   By: Hulan Saas M.D.   On: 04/29/2017 11:41    Speciality Comments: No specialty comments available.    Procedures:  No procedures performed Allergies: Patient has no known allergies.   Assessment / Plan:     Visit Diagnoses: SCLERODERMA - Limited systemic  sclerosis, sclerodactyly, Raynauds, Telengectesia's, positive ANA centromere, positive RF, positive CCP. She has no worsening of her skin tightness. She had no synovitis on examination today. She has mild digital cyanosis.  I've advised her to schedule an appointment with Dr. Gala Romney. She states she does not recall things very well and requested [place] to schedule an appointment for her.  Raynaud's disease without gangrene: controlled on amlodipine  Her blood pressure was mildly elevated today. I've advised to monitor blood pressure closely.  Idiopathic chronic gout of left foot without tophus - On allopurinol. Patient has not had any flare in long time. Her last uric acid in November was normal. I've advised her to get uric acid the next blood work with her PCP. She will continue allopurinol.  Hyperuricemia: Last uric acid was normal.  Age-related osteoporosis without current pathological fracture - July 2016 T score -2.5 left femoral neck, she is on Fosamax  Postural kyphosis of thoracic region  SEBORRHEIC KERATOSIS    Orders: Orders Placed This Encounter  Procedures  . AMB referral to CHF clinic   No orders of the defined types were placed in this encounter.   Face-to-face time spent with patient was 30 minutes. 50% of time was spent in counseling and coordination of care.  Follow-Up Instructions: Return in about 6 months (around 11/13/2017) for Scleroderma.   Pollyann Savoy, MD  Note - This record has been created using Animal nutritionist.  Chart creation errors have been sought, but may not always  have been located. Such creation errors do not reflect on  the standard of medical care.

## 2017-05-10 DIAGNOSIS — M4000 Postural kyphosis, site unspecified: Secondary | ICD-10-CM

## 2017-05-10 DIAGNOSIS — M4004 Postural kyphosis, thoracic region: Secondary | ICD-10-CM | POA: Insufficient documentation

## 2017-05-10 HISTORY — DX: Postural kyphosis, site unspecified: M40.00

## 2017-05-13 ENCOUNTER — Ambulatory Visit (INDEPENDENT_AMBULATORY_CARE_PROVIDER_SITE_OTHER): Payer: Medicare PPO | Admitting: Rheumatology

## 2017-05-13 ENCOUNTER — Encounter: Payer: Self-pay | Admitting: Rheumatology

## 2017-05-13 VITALS — BP 138/74 | HR 80 | Resp 14 | Wt 176.0 lb

## 2017-05-13 DIAGNOSIS — M1A0720 Idiopathic chronic gout, left ankle and foot, without tophus (tophi): Secondary | ICD-10-CM

## 2017-05-13 DIAGNOSIS — L821 Other seborrheic keratosis: Secondary | ICD-10-CM

## 2017-05-13 DIAGNOSIS — I7300 Raynaud's syndrome without gangrene: Secondary | ICD-10-CM | POA: Diagnosis not present

## 2017-05-13 DIAGNOSIS — E790 Hyperuricemia without signs of inflammatory arthritis and tophaceous disease: Secondary | ICD-10-CM | POA: Diagnosis not present

## 2017-05-13 DIAGNOSIS — M4004 Postural kyphosis, thoracic region: Secondary | ICD-10-CM

## 2017-05-13 DIAGNOSIS — M349 Systemic sclerosis, unspecified: Secondary | ICD-10-CM | POA: Diagnosis not present

## 2017-05-13 DIAGNOSIS — M810 Age-related osteoporosis without current pathological fracture: Secondary | ICD-10-CM | POA: Diagnosis not present

## 2017-05-13 NOTE — Patient Instructions (Signed)
CBC, CMP and uric acid in 6 months with PCP  Forward lab results to us which were drawn today  Follow up appointment with Dr. Gala RomneyBensimhon  Bone density is due in July 2018

## 2017-05-22 ENCOUNTER — Other Ambulatory Visit (HOSPITAL_COMMUNITY): Payer: Self-pay | Admitting: *Deleted

## 2017-05-22 DIAGNOSIS — I5022 Chronic systolic (congestive) heart failure: Secondary | ICD-10-CM

## 2017-05-23 ENCOUNTER — Telehealth: Payer: Self-pay | Admitting: Rheumatology

## 2017-05-23 NOTE — Telephone Encounter (Signed)
[facility] Left message for her to contact the office. The Echo was not ordered by our office. She will need to contact the office who ordered the echo.

## 2017-05-23 NOTE — Telephone Encounter (Signed)
[facility] and is needing us to put in the order and do the pre cert for the patient to get her EKO done.  They will schedule her once they have that.  CB#[id number] option 3.  Thank you.

## 2017-05-23 NOTE — Telephone Encounter (Signed)
Advised Dawn that we referred the patient to see Dr. Gala RomneyBensimhon for a consultation. That we do not order echo's nor do the precerts. Advised the only thing we have ever done for out patient's is refer them for consultation.

## 2017-06-19 ENCOUNTER — Other Ambulatory Visit: Payer: Self-pay | Admitting: Rheumatology

## 2017-06-19 NOTE — Telephone Encounter (Signed)
Last Visit: 05/13/17 Next Visit: 11/13/17 Labs: 11/11/16 stable  Okay to refill Allopurinol?

## 2017-06-19 NOTE — Telephone Encounter (Signed)
Ok to refill.labs due now

## 2017-06-24 ENCOUNTER — Other Ambulatory Visit: Payer: Self-pay | Admitting: Rheumatology

## 2017-06-24 DIAGNOSIS — M810 Age-related osteoporosis without current pathological fracture: Secondary | ICD-10-CM

## 2017-06-24 DIAGNOSIS — Z79899 Other long term (current) drug therapy: Secondary | ICD-10-CM

## 2017-06-24 NOTE — Telephone Encounter (Signed)
Last Visit: 05/13/17 Next Visit: 11/13/17 Labs: 11/11/16 stable   Okay to refill Fosamax?

## 2017-06-24 NOTE — Telephone Encounter (Signed)
Okay to give one month supply. Have a repeat CMP and vitamin D

## 2017-06-25 ENCOUNTER — Other Ambulatory Visit: Payer: Self-pay

## 2017-06-25 DIAGNOSIS — M810 Age-related osteoporosis without current pathological fracture: Secondary | ICD-10-CM

## 2017-06-25 DIAGNOSIS — E790 Hyperuricemia without signs of inflammatory arthritis and tophaceous disease: Secondary | ICD-10-CM

## 2017-06-25 DIAGNOSIS — M349 Systemic sclerosis, unspecified: Secondary | ICD-10-CM

## 2017-06-25 LAB — CBC WITH DIFFERENTIAL/PLATELET
BASOS ABS: 0 {cells}/uL (ref 0–200)
Basophils Relative: 0 %
EOS PCT: 2 %
Eosinophils Absolute: 108 cells/uL (ref 15–500)
HEMATOCRIT: 41.4 % (ref 35.0–45.0)
HEMOGLOBIN: 13.5 g/dL (ref 11.7–15.5)
LYMPHS ABS: 1890 {cells}/uL (ref 850–3900)
Lymphocytes Relative: 35 %
MCH: 29.7 pg (ref 27.0–33.0)
MCHC: 32.6 g/dL (ref 32.0–36.0)
MCV: 91.2 fL (ref 80.0–100.0)
MONO ABS: 378 {cells}/uL (ref 200–950)
MPV: 12.2 fL (ref 7.5–12.5)
Monocytes Relative: 7 %
NEUTROS ABS: 3024 {cells}/uL (ref 1500–7800)
NEUTROS PCT: 56 %
Platelets: 208 10*3/uL (ref 140–400)
RBC: 4.54 MIL/uL (ref 3.80–5.10)
RDW: 14.1 % (ref 11.0–15.0)
WBC: 5.4 10*3/uL (ref 3.8–10.8)

## 2017-06-25 NOTE — Telephone Encounter (Signed)
Patient advised will fill 30 day supply and she will need to come for labs. Patient will try to come today.

## 2017-06-26 ENCOUNTER — Telehealth: Payer: Self-pay | Admitting: *Deleted

## 2017-06-26 LAB — URINALYSIS, MICROSCOPIC ONLY
Bacteria, UA: NONE SEEN [HPF]
Casts: NONE SEEN [LPF]
Crystals: NONE SEEN [HPF]
Yeast: NONE SEEN [HPF]

## 2017-06-26 LAB — COMPLETE METABOLIC PANEL WITH GFR
ALBUMIN: 3.8 g/dL (ref 3.6–5.1)
ALK PHOS: 54 U/L (ref 33–130)
ALT: 25 U/L (ref 6–29)
AST: 24 U/L (ref 10–35)
BILIRUBIN TOTAL: 0.5 mg/dL (ref 0.2–1.2)
BUN: 13 mg/dL (ref 7–25)
CALCIUM: 9.2 mg/dL (ref 8.6–10.4)
CO2: 23 mmol/L (ref 20–31)
Chloride: 103 mmol/L (ref 98–110)
Creat: 1.05 mg/dL — ABNORMAL HIGH (ref 0.60–0.93)
GFR, Est African American: 61 mL/min (ref >=60)
GFR, Est Non African American: 53 mL/min — ABNORMAL LOW (ref >=60)
GLUCOSE: 181 mg/dL — AB (ref 65–99)
Potassium: 4.6 mmol/L (ref 3.5–5.3)
SODIUM: 138 mmol/L (ref 135–146)
TOTAL PROTEIN: 6.8 g/dL (ref 6.1–8.1)

## 2017-06-26 LAB — URINALYSIS, ROUTINE W REFLEX MICROSCOPIC
BILIRUBIN URINE: NEGATIVE
GLUCOSE, UA: NEGATIVE
HGB URINE DIPSTICK: NEGATIVE
KETONES UR: NEGATIVE
Nitrite: NEGATIVE
PROTEIN: NEGATIVE
Specific Gravity, Urine: 1.017 (ref 1.001–1.035)
pH: 5 (ref 5.0–8.0)

## 2017-06-26 LAB — VITAMIN D 25 HYDROXY (VIT D DEFICIENCY, FRACTURES): Vit D, 25-Hydroxy: 26 ng/mL — ABNORMAL LOW (ref 30–100)

## 2017-06-26 LAB — URIC ACID: URIC ACID, SERUM: 4.2 mg/dL (ref 2.5–7.0)

## 2017-06-26 MED ORDER — VITAMIN D (ERGOCALCIFEROL) 1.25 MG (50000 UNIT) PO CAPS
50000.0000 [IU] | ORAL_CAPSULE | ORAL | 0 refills | Status: DC
Start: 2017-06-26 — End: 2017-11-13

## 2017-06-26 NOTE — Telephone Encounter (Signed)
Send prescription for Vitamin D [id number] unit once weekly for 12 weeks. Recheck Vitamin D in 12 weeks. Urine shows 1+ Leuk Est. If symptomatic see PCP for treatment. Probably poor specimen collection.   Patient advised of lab results and prescription sent to the pharmacy.   Patient states when she had her labs done she had trouble getting the bleeding to stop from where the blood was drawn. Patient states it took she believes approximately 2 hours before it stopped. Patient states she does take ASA 325 daily. Patient states she just wanted to make us aware.

## 2017-07-01 ENCOUNTER — Ambulatory Visit (HOSPITAL_BASED_OUTPATIENT_CLINIC_OR_DEPARTMENT_OTHER)
Admission: RE | Admit: 2017-07-01 | Discharge: 2017-07-01 | Disposition: A | Discharge status: Home / Self Care | Payer: Medicare PPO | Source: Ambulatory Visit | Attending: Internal Medicine | Admitting: Internal Medicine

## 2017-07-01 ENCOUNTER — Ambulatory Visit (HOSPITAL_COMMUNITY)
Admission: RE | Admit: 2017-07-01 | Discharge: 2017-07-01 | Disposition: A | Discharge status: Home / Self Care | Payer: Medicare PPO | Source: Ambulatory Visit | Attending: Internal Medicine | Admitting: Internal Medicine

## 2017-07-01 ENCOUNTER — Encounter (HOSPITAL_COMMUNITY): Payer: Self-pay | Admitting: Internal Medicine

## 2017-07-01 VITALS — BP 142/76 | HR 72 | Wt 173.5 lb

## 2017-07-01 DIAGNOSIS — E785 Hyperlipidemia, unspecified: Secondary | ICD-10-CM | POA: Insufficient documentation

## 2017-07-01 DIAGNOSIS — M349 Systemic sclerosis, unspecified: Secondary | ICD-10-CM | POA: Diagnosis not present

## 2017-07-01 DIAGNOSIS — I348 Other nonrheumatic mitral valve disorders: Secondary | ICD-10-CM | POA: Insufficient documentation

## 2017-07-01 DIAGNOSIS — E119 Type 2 diabetes mellitus without complications: Secondary | ICD-10-CM | POA: Insufficient documentation

## 2017-07-01 DIAGNOSIS — I5022 Chronic systolic (congestive) heart failure: Secondary | ICD-10-CM

## 2017-07-01 DIAGNOSIS — I110 Hypertensive heart disease with heart failure: Secondary | ICD-10-CM | POA: Diagnosis not present

## 2017-07-01 DIAGNOSIS — I509 Heart failure, unspecified: Secondary | ICD-10-CM | POA: Diagnosis present

## 2017-07-01 LAB — PULMONARY FUNCTION TEST
FEF 25-75 Post: 2.62 L/sec
FEF 25-75 Pre: 2.48 L/sec
FEF2575-%CHANGE-POST: 5 %
FEF2575-%PRED-POST: 141 %
FEF2575-%Pred-Pre: 134 %
FEV1-%CHANGE-POST: 3 %
FEV1-%PRED-POST: 99 %
FEV1-%Pred-Pre: 96 %
FEV1-POST: 2.27 L
FEV1-Pre: 2.2 L
FEV1FVC-%Change-Post: 3 %
FEV1FVC-%Pred-Pre: 109 %
FEV6-%Change-Post: 0 %
FEV6-%PRED-POST: 91 %
FEV6-%Pred-Pre: 90 %
FEV6-PRE: 2.63 L
FEV6-Post: 2.65 L
FEV6FVC-%CHANGE-POST: 1 %
FEV6FVC-%PRED-PRE: 103 %
FEV6FVC-%Pred-Post: 105 %
FVC-%Change-Post: 0 %
FVC-%Pred-Post: 87 %
FVC-%Pred-Pre: 87 %
FVC-Post: 2.65 L
FVC-Pre: 2.67 L
POST FEV1/FVC RATIO: 86 %
PRE FEV6/FVC RATIO: 99 %
Post FEV6/FVC ratio: 100 %
Pre FEV1/FVC ratio: 82 %
RV % PRED: 47 %
RV: 1.08 L
TLC % pred: 78 %
TLC: 4.11 L

## 2017-07-01 MED ORDER — ALBUTEROL SULFATE (2.5 MG/3ML) 0.083% IN NEBU
2.5000 mg | INHALATION_SOLUTION | Freq: Once | RESPIRATORY_TRACT | Status: AC
  Administered 2017-07-01: 2.5 mg via RESPIRATORY_TRACT

## 2017-07-01 NOTE — Patient Instructions (Signed)
We will contact you in 1 year to schedule your next appointment with pft's and echocardiogram

## 2017-07-01 NOTE — Progress Notes (Signed)
  Echocardiogram 2D Echocardiogram has been performed.  Andrea Heath 07/01/2017, 10:48 AM

## 2017-07-01 NOTE — Progress Notes (Signed)
[facility] CONSULT NOTE  Referring Physician: Dr. Melvenia NeedlesShali Deveshwar  HPI:  Steward DroneBrenda is a 71 y/o with h/o HTN, DM, HL and scleroderma referred by Dr. Corliss Skainseveshwar   No known CAD. Had stress Myoview 1/17 EF 79% no perfusion defects.   Goes to [facility] 3x/week no undue SOB or CP or edema. + Raynaud's. Struggles with mild short-term memory loss. [facility]   Echo 07/01/17  EF 60-65% Grade I DD  Calcified MV. RV normal.  - viewed personally  PFTs 07/01/17 (DLCO equipment broke) FEV1 2.20 (96%) FVC  2.67 (87%) DLCO - equipment broke   PFTs 06/20/15 FEV1 2.3 (97%) FVC  2.7 (88%) DLCO 77%  Review of Systems: [y] = yes, [ ]  = no   General: Weight gain [ ] ; Weight loss [ ] ; Anorexia [ ] ; Fatigue [ ] ; Fever [ ] ; Chills [ ] ; Weakness [ ]   Cardiac: Chest pain/pressure [ ] ; Resting SOB [ ] ; Exertional SOB [ ] ; Orthopnea [ ] ; Pedal Edema [ ] ; Palpitations [ ] ; Syncope [ ] ; Presyncope [ ] ; Paroxysmal nocturnal dyspnea[ ]   Pulmonary: Cough [ ] ; Wheezing[ ] ; Hemoptysis[ ] ; Sputum [ ] ; Snoring [ ]   GI: Vomiting[ ] ; Dysphagia[ ] ; Melena[ ] ; Hematochezia [ ] ; Heartburn[ ] ; Abdominal pain [ ] ; Constipation [ ] ; Diarrhea [ ] ; BRBPR [ ]   GU: Hematuria[ ] ; Dysuria [ ] ; Nocturia[ ]   Vascular: Pain in legs with walking [ ] ; Pain in feet with lying flat [ ] ; Non-healing sores [ ] ; Stroke [ ] ; TIA [ ] ; Slurred speech [ ] ;  Neuro: Headaches[ ] ; Vertigo[ ] ; Seizures[ ] ; Paresthesias[ ] ;Blurred vision [ ] ; Diplopia [ ] ; Vision changes [ ]   Ortho/Skin: Arthritis [ y]; Joint pain Cove.Etienne[y ]; Muscle pain [ ] ; Joint swelling [ ] ; Back Pain [ ] ; Rash [ ]   Psych: Depression[ ] ; Anxiety[ ]   Heme: Bleeding problems [ ] ; Clotting disorders [ ] ; Anemia [ ]   Endocrine: Diabetes Cove.Etienne[y ]; Thyroid dysfunction[ ]    Past Medical History:  Diagnosis Date  . Diabetes mellitus   . GERD (gastroesophageal reflux disease)   . Gout   . Hyperlipidemia   . Hypertension   . Keratosis seborrheica   . Macular degeneration of  both eyes   . Osteoporosis   . Raynaud's disease   . Rhinitis   . Scleroderma (HCC)     Current Outpatient Prescriptions  Medication Sig Dispense Refill  . alendronate (FOSAMAX) 70 MG tablet TAKE ONE TABLET BY MOUTH WEEKLY  4 tablet 0  . allopurinol (ZYLOPRIM) 300 MG tablet TAKE 1/2 TABLET BY MOUTH DAILY FOR 1 MONTH THEN 1 TABLET DAILY 90 tablet 0  . amLODipine (NORVASC) 5 MG tablet Take 1 tablet (5 mg total) by mouth daily. 90 tablet 1  . aspirin (BAYER ASPIRIN) 325 MG tablet Take 325 mg by mouth daily.      Marland Kitchen. atorvastatin (LIPITOR) 20 MG tablet TAKE 1 TABLET BY MOUTH DAILY. 90 tablet 1  . Biotin 1 MG CAPS Take 1 mg by mouth daily.    . calcium-vitamin D (OSCAL WITH D) 500-200 MG-UNIT tablet Take 1 tablet by mouth daily.    . cholecalciferol (VITAMIN D) 1000 units tablet Take 1,000 Units by mouth daily.    . colchicine 0.6 MG tablet TAKE 1 TABLET BY MOUTH ONCE A DAY 30 tablet 2  . fluticasone (FLONASE) 50 MCG/ACT nasal spray USE 2 SPRAYS IN EACH NOSTRIL ONCE A DAY 16 g 2  . glipiZIDE (GLUCOTROL  XL) 5 MG 24 hr tablet TAKE 1 TABLET BY MOUTH ONCE DAILY 90 tablet 0  . levothyroxine (SYNTHROID, LEVOTHROID) 75 MCG tablet TAKE 1 TABLET (75 MCG TOTAL) BY MOUTH DAILY. 90 tablet 1  . lisinopril (PRINIVIL,ZESTRIL) 5 MG tablet Take 1 tablet (5 mg total) by mouth daily. 90 tablet 1  . Lutein 20 MG CAPS Take 1 capsule by mouth daily.    Marland Kitchen omeprazole (PRILOSEC) 20 MG capsule Take 2 capsules (40 mg total) by mouth daily. 30 capsule 0  . Vitamin D, Ergocalciferol, (DRISDOL) 50000 units CAPS capsule Take 1 capsule (50,000 Units total) by mouth every 7 (seven) days. 12 capsule 0  . triamcinolone (NASACORT) 55 MCG/ACT AERO nasal inhaler Place 2 sprays into the nose daily.     No current facility-administered medications for this encounter.     No Known Allergies    Social History   Social History  . Marital status: Married    Spouse name: N/A  . Number of children: N/A  . Years of education: N/A     Occupational History  . Not on file.   Social History Main Topics  . Smoking status: Never Smoker  . Smokeless tobacco: Never Used  . Alcohol use No  . Drug use: No  . Sexual activity: Not on file   Other Topics Concern  . Not on file   Social History Narrative  . No narrative on file      Family History  Problem Relation Age of Onset  . Stroke Mother   . Heart attack Mother   . Cancer Mother        breast cancer  . Breast cancer Mother 25  . Lung cancer Father   . Achalasia Brother   . Arrhythmia Brother   . Arrhythmia Brother     Vitals:   07/01/17 1101  BP: (!) 142/76  Pulse: 72  SpO2: 100%  Weight: 173 lb 8 oz (78.7 kg)    PHYSICAL EXAM: General:  Well appearing. No respiratory difficulty HEENT: normal Neck: supple. no JVD. Carotids 2+ bilat; no bruits. No lymphadenopathy or thryomegaly appreciated. Cor: PMI nondisplaced. Regular rate & rhythm. No rubs, gallops or murmurs. Lungs: clear Abdomen: soft, nontender, nondistended. No hepatosplenomegaly. No bruits or masses. Good bowel sounds. Extremities: no cyanosis, clubbing, rash, wearing gloves  +sclerodactyly and mild blanching  Neuro: alert & oriented x 3, cranial nerves grossly intact. moves all 4 extremities w/o difficulty. Affect pleasant.   ASSESSMENT & PLAN: 1. Scleroderma - We discussed the incidence of PAH in patients with scleroderma and CTD (risk about 30%) and need for routine screening,. - Echo images and PFTs reviewed personally - No evidence of PAH or RV strain on echo - PFTs - spirometry stable since 2016. DLCO unobtainable but previously normal. - Currently no evidence of PAH or pulmonary fibrosis. Will re-screen in one year.   Arvilla Meres, MD  8:36 PM

## 2017-07-31 ENCOUNTER — Other Ambulatory Visit: Payer: Self-pay | Admitting: Rheumatology

## 2017-08-01 NOTE — Telephone Encounter (Signed)
Last Visit: 05/13/17 Next Visit: 11/13/17 Labs: 06/25/17 Stable, elevated glucose  Okay to refill per Dr. Corliss Skainseveshwar

## 2017-09-24 ENCOUNTER — Encounter: Payer: Self-pay | Admitting: *Deleted

## 2017-09-24 ENCOUNTER — Other Ambulatory Visit: Payer: Self-pay | Admitting: Rheumatology

## 2017-09-24 NOTE — Telephone Encounter (Signed)
Last Visit: 05/13/17 Next Visit: 11/13/17 Labs: 06/25/17 Stable, elevated glucose  Okay to refill per Dr. Deveshwar  

## 2017-09-25 ENCOUNTER — Ambulatory Visit (INDEPENDENT_AMBULATORY_CARE_PROVIDER_SITE_OTHER): Payer: Medicare PPO | Admitting: Neurology

## 2017-09-25 ENCOUNTER — Encounter: Payer: Self-pay | Admitting: Neurology

## 2017-09-25 VITALS — BP 136/82 | HR 60 | Ht 67.0 in | Wt 176.8 lb

## 2017-09-25 DIAGNOSIS — F0390 Unspecified dementia without behavioral disturbance: Secondary | ICD-10-CM

## 2017-09-25 DIAGNOSIS — R413 Other amnesia: Secondary | ICD-10-CM | POA: Diagnosis not present

## 2017-09-25 MED ORDER — DONEPEZIL HCL 10 MG PO TABS: 10.0000 mg | ORAL_TABLET | Freq: Every day | ORAL | 6 refills | Status: DC

## 2017-09-25 NOTE — Progress Notes (Signed)
[facility]    Provider:  Dr Lucia Gaskins Referring Provider: Clayborn Heron, MD Primary Care Physician:  Clayborn Heron, MD  CC:  Dementia  HPI:  Andrea Heath is a 71 y.o. female here as a referral from Dr. Barbaraann Barthel for MCI possibly Alzheimer  Since then things are stable. She has had 2 episodes of forgetting what she did, she couldn't remember walking to the mailbox or what she did, it came back to her later. Last one was 18 months ago, this happened a few weekends in a row and that's when the memory changes started. Was in the setting of pneumonia and was treated. They feel memory is stable. A Maternal and a Paternal Aunt had memory loss. Both parents died in their 27s.so unclear if they had memory issues or were to develop. No falls, no hallucinations, no depression or anxiety, no accidents in the home. There is frustration over memory loss. No behavioral problems. Here with husband who also provides information. She reads, goes to the gym 3x a week, plays computer games.   Reviewed notes, labs and imaging from outside physicians, which showed:  B12 427, TSH WNL  CT head 2017 showed No acute intracranial abnormalities including mass lesion or mass effect, hydrocephalus, extra-axial fluid collection, midline shift, hemorrhage, or acute infarction, large ischemic events (personally reviewed images)     Review of Systems: Patient complains of symptoms per HPI as well as the following symptoms: memory loss. Pertinent negatives and positives per HPI. All others negative.   Social History   Social History  . Marital status: Married    Spouse name: N/A  . Number of children: N/A  . Years of education: N/A   Occupational History  . Not on file.   Social History Main Topics  . Smoking status: Never Smoker  . Smokeless tobacco: Never Used  . Alcohol use No  . Drug use: No  . Sexual activity: Not on file   Other Topics Concern  . Not on file    Social History Narrative  . No narrative on file    Family History  Problem Relation Age of Onset  . Stroke Mother   . Heart attack Mother   . Cancer Mother        breast cancer  . Breast cancer Mother 10  . Lung cancer Father   . Achalasia Brother   . Arrhythmia Brother   . Arrhythmia Brother     Past Medical History:  Diagnosis Date  . Diabetes mellitus   . GERD (gastroesophageal reflux disease)   . Gout   . Hyperlipidemia   . Hypertension   . Keratosis seborrheica   . Macular degeneration of both eyes   . Osteoporosis   . Raynaud's disease   . Rhinitis   . Scleroderma [facility])     Past Surgical History:  Procedure Laterality Date  . BREAST EXCISIONAL BIOPSY Left 2005  . CHOLECYSTECTOMY    . MASTECTOMY PARTIAL / LUMPECTOMY     left breast   . TUBAL LIGATION      Current Outpatient Prescriptions  Medication Sig Dispense Refill  . alendronate (FOSAMAX) 70 MG tablet TAKE ONE TABLET BY MOUTH WEEKLY  4 tablet 0  . allopurinol (ZYLOPRIM) 300 MG tablet TAKE 1/2 TABLET BY MOUTH ONE TIME DAILY FOR ONE MONTH, THEN 1 TABLET DAILY THEREAFTER 15 tablet 0  . amLODipine (NORVASC) 5 MG tablet Take 1 tablet (5 mg total) by mouth daily. 90 tablet 1  .  aspirin (BAYER ASPIRIN) 325 MG tablet Take 325 mg by mouth daily.      Marland Kitchen atorvastatin (LIPITOR) 20 MG tablet TAKE 1 TABLET BY MOUTH DAILY. 90 tablet 1  . Biotin 1 MG CAPS Take 1 mg by mouth daily.    . calcium-vitamin D (OSCAL WITH D) 500-200 MG-UNIT tablet Take 1 tablet by mouth daily.    . cholecalciferol (VITAMIN D) 1000 units tablet Take 1,000 Units by mouth daily.    Marland Kitchen COLCRYS 0.6 MG tablet take 1 tablet by mouth once a day 30 tablet 1  . fluticasone (FLONASE) 50 MCG/ACT nasal spray USE 2 SPRAYS IN EACH NOSTRIL ONCE A DAY 16 g 2  . glipiZIDE (GLUCOTROL XL) 5 MG 24 hr tablet TAKE 1 TABLET BY MOUTH ONCE DAILY 90 tablet 0  . levothyroxine (SYNTHROID, LEVOTHROID) 75 MCG tablet TAKE 1 TABLET (75 MCG TOTAL) BY MOUTH DAILY. 90  tablet 1  . lisinopril (PRINIVIL,ZESTRIL) 5 MG tablet Take 1 tablet (5 mg total) by mouth daily. 90 tablet 1  . Lutein 20 MG CAPS Take 1 capsule by mouth daily.    Marland Kitchen omeprazole (PRILOSEC) 20 MG capsule Take 2 capsules (40 mg total) by mouth daily. 30 capsule 0  . triamcinolone (NASACORT) 55 MCG/ACT AERO nasal inhaler Place 2 sprays into the nose daily.    . Vitamin D, Ergocalciferol, (DRISDOL) 50000 units CAPS capsule Take 1 capsule (50,000 Units total) by mouth every 7 (seven) days. 12 capsule 0   No current facility-administered medications for this visit.     Allergies as of 09/25/2017  . (No Known Allergies)    Vitals: BP 136/82   Pulse 60   Ht  (1.702 m)   Wt 176 lb 12.8 oz (80.2 kg)   BMI 27.69 kg/m  Last Weight:  Wt Readings from Last 1 Encounters:  09/25/17 176 lb 12.8 oz (80.2 kg)   Last Height:   Ht Readings from Last 1 Encounters:  09/25/17  (1.702 m)   Physical exam: Exam: Gen: NAD, conversant, well nourised, obese, well groomed                     CV: RRR, no MRG. No Carotid Bruits. No peripheral edema, warm, nontender Eyes: Conjunctivae clear without exudates or hemorrhage  Neuro: Detailed Neurologic Exam  Speech:    Speech is normal; fluent and spontaneous with normal comprehension.  Cognition:  MMSE - Mini Mental State Exam 09/25/2017  Orientation to time 4  Orientation to Place 5  Registration 3  Attention/ Calculation 5  Recall 0  Language- name 2 objects 2  Language- repeat 1  Language- follow 3 step command 3  Language- read & follow direction 1  Write a sentence 1  Copy design 1  Total score 26      Cranial Nerves:    The pupils are equal, round, and reactive to light. Attempted funduscopic exam could not visualize due to small pupils. Visual fields are full to finger confrontation. Extraocular movements are intact. Trigeminal sensation is intact and the muscles of mastication are normal. The face is symmetric. The palate  elevates in the midline. Hearing intact. Voice is normal. Shoulder shrug is normal. The tongue has normal motion without fasciculations.   Coordination:    No dysmetria  Gait:    No ataxia  Motor Observation:    No asymmetry, no atrophy, and no involuntary movements noted. Tone:    Normal muscle tone.    Posture:  Posture is normal. normal erect    Strength:    Strength is V/V in the upper and lower limbs.      Sensation: intact to LT     Reflex Exam:  DTR's:    Deep tendon reflexes in the upper and lower extremities are brisk bilaterally.   Toes:    The toes are downgoing bilaterally.   Clonus:    Clonus is absent.       Assessment/Plan:  Lovely 71 year old female with a diagnosis of mild cognitive impairment likely Alzheimer's type. Patient had formal neurocognitive testing completed at [facility].  - MRI brain to evaluate for other etiologies and possible reversible causes of dementia - Will start Aricept, per patient and husband cardiology is in agreement but discussed that I need to email Dr. Corliss Skains and make sure aricept is fine given her pulmonary involvement of scleroderma - B12 and TSH previously completed - Can consider repeat neurocognitive testing in the future - Discussed clinical trials we have available for Alzheimer's dementia.   Naomie Dean, MD  [facility] [address] [place], [place] [address]  Phone [phone] Fax [phone]

## 2017-09-25 NOTE — Patient Instructions (Addendum)
Start Aricept take 1/2 for one week and increase to a whole pill if no side effects MRI of the brain Consider repeat Neurocognitive testing in 2019   Donepezil tablets What is this medicine? DONEPEZIL (doe NEP e zil) is used to treat mild to moderate dementia caused by Alzheimer's disease. This medicine may be used for other purposes; ask your health care provider or pharmacist if you have questions. COMMON BRAND NAME(S): Aricept What should I tell my health care provider before I take this medicine? They need to know if you have any of these conditions: -asthma or other lung disease -difficulty passing urine -head injury -heart disease -history of irregular heartbeat -liver disease -seizures (convulsions) -stomach or intestinal disease, ulcers or stomach bleeding -an unusual or allergic reaction to donepezil, other medicines, foods, dyes, or preservatives -pregnant or trying to get pregnant -breast-feeding How should I use this medicine? Take this medicine by mouth with a glass of water. Follow the directions on the prescription label. You may take this medicine with or without food. Take this medicine at regular intervals. This medicine is usually taken before bedtime. Do not take it more often than directed. Continue to take your medicine even if you feel better. Do not stop taking except on your doctor's advice. If you are taking the 23 mg donepezil tablet, swallow it whole; do not cut, crush, or chew it. Talk to your pediatrician regarding the use of this medicine in children. Special care may be needed. Overdosage: If you think you have taken too much of this medicine contact a poison control center or emergency room at once. NOTE: This medicine is only for you. Do not share this medicine with others. What if I miss a dose? If you miss a dose, take it as soon as you can. If it is almost time for your next dose, take only that dose, do not take double or extra doses. What may  interact with this medicine? Do not take this medicine with any of the following medications: -certain medicines for fungal infections like itraconazole, fluconazole, posaconazole, and voriconazole -cisapride -dextromethorphan; quinidine -dofetilide -dronedarone -pimozide -quinidine -thioridazine -ziprasidone This medicine may also interact with the following medications: -antihistamines for allergy, cough and cold -atropine -bethanechol -carbamazepine -certain medicines for bladder problems like oxybutynin, tolterodine -certain medicines for Parkinson's disease like benztropine, trihexyphenidyl -certain medicines for stomach problems like dicyclomine, hyoscyamine -certain medicines for travel sickness like scopolamine -dexamethasone -ipratropium -NSAIDs, medicines for pain and inflammation, like ibuprofen or naproxen -other medicines for Alzheimer's disease -other medicines that prolong the QT interval (cause an abnormal heart rhythm) -phenobarbital -phenytoin -rifampin, rifabutin or rifapentine This list may not describe all possible interactions. Give your health care provider a list of all the medicines, herbs, non-prescription drugs, or dietary supplements you use. Also tell them if you smoke, drink alcohol, or use illegal drugs. Some items may interact with your medicine. What should I watch for while using this medicine? Visit your doctor or health care professional for regular checks on your progress. Check with your doctor or health care professional if your symptoms do not get better or if they get worse. You may get drowsy or dizzy. Do not drive, use machinery, or do anything that needs mental alertness until you know how this drug affects you. What side effects may I notice from receiving this medicine? Side effects that you should report to your doctor or health care professional as soon as possible: -allergic reactions like skin rash, itching or hives,  swelling of the  face, lips, or tongue -feeling faint or lightheaded, falls -loss of bladder control -seizures -signs and symptoms of a dangerous change in heartbeat or heart rhythm like chest pain; dizziness; fast or irregular heartbeat; palpitations; feeling faint or lightheaded, falls; breathing problems -signs and symptoms of infection like fever or chills; cough; sore throat; pain or trouble passing urine -signs and symptoms of liver injury like dark yellow or brown urine; general ill feeling or flu-like symptoms; light-colored stools; loss of appetite; nausea; right upper belly pain; unusually weak or tired; yellowing of the eyes or skin -slow heartbeat or palpitations -unusual bleeding or bruising -vomiting Side effects that usually do not require medical attention (report to your doctor or health care professional if they continue or are bothersome): -diarrhea, especially when starting treatment -headache -loss of appetite -muscle cramps -nausea -stomach upset This list may not describe all possible side effects. Call your doctor for medical advice about side effects. You may report side effects to FDA at 1-800-FDA-1088. Where should I keep my medicine? Keep out of reach of children. Store at room temperature between 15 and 30 degrees C (59 and 86 degrees F). Throw away any unused medicine after the expiration date. NOTE: This sheet is a summary. It may not cover all possible information. If you have questions about this medicine, talk to your doctor, pharmacist, or health care provider.  2018 Elsevier/Gold Standard (2016-06-03 21:00:42)

## 2017-09-29 ENCOUNTER — Telehealth: Payer: Self-pay | Admitting: Neurology

## 2017-09-29 NOTE — Telephone Encounter (Signed)
Pt's husband called in she started donepezil (ARICEPT) 10 MG tablet Friday night (09/26/17). She did not have any side effects thru the weekend. This morning she is very lethargic and nauseated, no fever. Please call to discuss

## 2017-09-29 NOTE — Telephone Encounter (Signed)
I called pt's husband, Arietta Eisenstein, per DPR. Pt started donepezil  qhs on Friday night. Pt did not have any side effects Saturday and Sunday. However, this morning, pt feels very nauseated and feels like a "mack truck hit her." Pt's husband denies pt having a fever and wants to know if this could be related to the aricept. Pt's husband knows that we will call pt back with Dr. Trevor Mace recommendations.

## 2017-09-29 NOTE — Telephone Encounter (Signed)
I called pt's husband, per DPR. I advised him to have the pt stop the aricept for 1 week and then restart the aricept but only take 1/2 tablet. Pt's husband verbalized understanding. I asked pt's husband to let us know if pt's condition worsens and for chest pain, intense/extreme/intractable, nausea/vomiting, extreme pain,SoB, stroke symptoms, to take pt to ED. Pt's husband verbalized understanding.

## 2017-09-29 NOTE — Telephone Encounter (Signed)
Would stop the aricept for a week and then restart on 1/2 a pill thanks

## 2017-10-03 ENCOUNTER — Telehealth: Payer: Self-pay | Admitting: Rheumatology

## 2017-10-03 MED ORDER — ALLOPURINOL 300 MG PO TABS: ORAL_TABLET | ORAL | 0 refills | Status: DC

## 2017-10-03 MED ORDER — COLCHICINE 0.6 MG PO TABS: 0.6000 mg | ORAL_TABLET | Freq: Every day | ORAL | 1 refills | Status: DC

## 2017-10-03 NOTE — Telephone Encounter (Signed)
Prescription sent to the pharmacy.

## 2017-10-03 NOTE — Telephone Encounter (Signed)
Please resend colcrys and allopurinol to [facility].

## 2017-10-07 ENCOUNTER — Other Ambulatory Visit: Payer: Self-pay | Admitting: Rheumatology

## 2017-10-07 NOTE — Telephone Encounter (Signed)
Refilled on 10/03/17

## 2017-10-07 NOTE — Telephone Encounter (Signed)
Last visit: 05/13/17 Next visit: 11/13/17   Labs: stable 06/25/17  Received refill request via fax from [facility] pharmacy.   Ok to refill Colcrys per Dr. Corliss Skains.

## 2017-10-10 ENCOUNTER — Ambulatory Visit (INDEPENDENT_AMBULATORY_CARE_PROVIDER_SITE_OTHER): Payer: Medicare PPO | Admitting: Rheumatology

## 2017-10-10 ENCOUNTER — Encounter: Payer: Self-pay | Admitting: Rheumatology

## 2017-10-10 VITALS — BP 138/78 | HR 78 | Resp 16 | Ht 67.0 in | Wt 174.0 lb

## 2017-10-10 DIAGNOSIS — M7062 Trochanteric bursitis, left hip: Secondary | ICD-10-CM

## 2017-10-10 MED ORDER — LIDOCAINE HCL 1 % IJ SOLN
1.5000 mL | INTRAMUSCULAR | Status: AC | PRN
  Administered 2017-10-10: 1.5 mL

## 2017-10-10 MED ORDER — TRIAMCINOLONE ACETONIDE 40 MG/ML IJ SUSP
40.0000 mg | INTRAMUSCULAR | Status: AC | PRN
  Administered 2017-10-10: 40 mg via INTRA_ARTICULAR

## 2017-10-10 NOTE — Progress Notes (Signed)
   Procedure Note  Patient: Andrea Heath             Date of Birth: [birth year]           MRN: [id number]             Visit Date: 10/10/2017  Procedures: Visit Diagnoses: Left hip pain  Patient complains of having left hip pain for the last 3 months. She states she has difficulty walking and also difficulty laying down on her left side. On examination she had tenderness on palpation over left trochanteric area consistent with trochanteric bursitis. After different treatment options were discussed with decided to proceed with the cortisone injection per request. She is diabetic and I have advised her to monitor her blood sugar very closely. Left hip trochanter pain patient requests injection BP 138/78 Large Joint Inj Date/Time: 10/10/2017 9:47 AM Performed by: Pollyann Savoy Authorized by: Pollyann Savoy   Consent Given by:  Patient Site marked: the procedure site was marked   Timeout: prior to procedure the correct patient, procedure, and site was verified   Indications:  Pain Location:  Hip Site:  L greater trochanter Needle Size:  27 G Needle Length:  1.5 inches Ultrasound Guidance: No   Fluoroscopic Guidance: No   Arthrogram: No   Medications:  1.5 mL lidocaine 1 %; 40 mg triamcinolone acetonide 40 MG/ML   Pollyann Savoy, MD

## 2017-10-10 NOTE — Patient Instructions (Signed)
Iliotibial Bursitis Rehab Ask your health care provider which exercises are safe for you. Do exercises exactly as told by your health care provider and adjust them as directed. It is normal to feel mild stretching, pulling, tightness, or discomfort as you do these exercises, but you should stop right away if you feel sudden pain or your pain gets worse.Do not begin these exercises until told by your health care provider. Stretching and range of motion exercises These exercises warm up your muscles and joints and improve the movement and flexibility of your leg. These exercises also help to relieve pain and stiffness. Exercise A: Quadriceps stretch, prone  1. Lie on your abdomen on a firm surface, such as a bed or padded floor. 2. Bend your left / right knee and hold your ankle. If you cannot reach your ankle or pant leg, loop a belt around your foot and grab the belt instead. 3. Gently pull your heel toward your buttocks. Your knee should not slide out to the side. You should feel a stretch in the front of your thigh and knee. 4. Hold this position for __________ seconds. Repeat __________ times. Complete this exercise __________ times a day. Exercise B: Lunge ( adductor stretch) 1. Stand and spread your legs about 3 feet (about 1 m) apart. Put your left / right leg slightly back for balance. 2. Lean away from your left / right leg by bending your other knee and shifting your weight toward your bent knee. You may rest your hands on your thigh for balance. You should feel a stretch in your left / right inner thigh. 3. Hold for __________ seconds. Repeat __________ times. Complete this exercise __________ times a day. Exercise C: Hamstring stretch, supine  1. Lie on your back. 2. Hold both ends of a belt or towel as you loop it over the ball of your left / right foot. The ball of your foot is on the walking surface, right under your toes. 3. Straighten your left / right knee and slowly pull on  the belt to raise your leg. Stop when you feel a gentle stretch in the back of your left / right knee or thigh. ? Do not let your left / right knee bend. ? Keep your other leg flat on the floor. 4. Hold this position for __________ seconds. Repeat __________ times. Complete this exercise __________ times a day. Strengthening exercises These exercises build strength and endurance in your leg. Endurance is the ability to use your muscles for a long time, even after they get tired. Exercise D: Quadriceps wall slides  1. Lean your back against a smooth wall or door while you walk your feet out 18-24 inches (46-61 cm) from it. 2. Place your feet hip-width apart. 3. Slowly slide down the wall or door until your knees bend as far as told by your health care provider. Keep your knees over your heels, not your toes. Keep your knees in line with your hips. 4. Hold for __________ seconds. 5. Push through your heels to stand up to rest for __________ seconds after each repetition. Repeat __________ times. Complete this exercise __________ times a day. Exercise E: Straight leg raises ( hip abductors) 1. Lie on your side, with your left / right leg in the top position. Lie so your head, shoulder, knee, and hip line up with each other. You may bend your bottom knee to help you balance. 2. Lift your top leg 4-6 inches (10-15 cm) while keeping your   toes pointed straight ahead. 3. Hold this position for __________ seconds. 4. Slowly lower your leg to the starting position. Allow your muscles to relax completely after each repetition. Repeat __________ times. Complete this exercise __________ times a day. Exercise F: Straight leg raises ( hip extensors) 1. Lie on your abdomen on a firm surface. You can put a pillow under your hips if that is more comfortable. 2. Tense the muscles in your buttocks and lift your left / right leg about 4-6 inches (10-15 cm). Keep your knee straight as you lift your leg. 3. Hold  this position for __________ seconds. 4. Slowly lower your leg to the starting position. 5. Let your leg relax completely after each repetition. Repeat __________ times. Complete this exercise __________ times a day. Exercise G: Bridge ( hip extensors) 1. Lie on your back on a firm surface with your knees bent and your feet flat on the floor. 2. Tighten your buttocks muscles and lift your bottom off the floor until your trunk is level with your thighs. ? Do not arch your back. ? You should feel the muscles working in your buttocks and the back of your thighs. If you do not feel these muscles, slide your feet 1-2 inches (2.5-5 cm) farther away from your buttocks. 3. Hold this position for __________ seconds. 4. Slowly lower your hips to the starting position. 5. Let your buttocks muscles relax completely between repetitions. 6. If this exercise is too easy, try doing it with your arms crossed over your chest. Repeat __________ times. Complete this exercise __________ times a day. This information is not intended to replace advice given to you by your health care provider. Make sure you discuss any questions you have with your health care provider. Document Released: 12/16/2005 Document Revised: 08/22/2016 Document Reviewed: 11/28/2015 Elsevier Interactive Patient Education  2018 Elsevier Inc.  

## 2017-10-16 ENCOUNTER — Ambulatory Visit
Admission: RE | Admit: 2017-10-16 | Discharge: 2017-10-16 | Disposition: A | Discharge status: Home / Self Care | Payer: Medicare PPO | Source: Ambulatory Visit | Attending: Neurology | Admitting: Neurology

## 2017-10-16 DIAGNOSIS — R413 Other amnesia: Secondary | ICD-10-CM | POA: Diagnosis not present

## 2017-10-16 DIAGNOSIS — F0390 Unspecified dementia without behavioral disturbance: Secondary | ICD-10-CM | POA: Diagnosis not present

## 2017-10-19 ENCOUNTER — Other Ambulatory Visit: Payer: Self-pay | Admitting: Rheumatology

## 2017-10-20 ENCOUNTER — Other Ambulatory Visit: Payer: Self-pay | Admitting: Rheumatology

## 2017-10-20 ENCOUNTER — Telehealth: Payer: Self-pay | Admitting: *Deleted

## 2017-10-20 DIAGNOSIS — E559 Vitamin D deficiency, unspecified: Secondary | ICD-10-CM

## 2017-10-20 NOTE — Telephone Encounter (Signed)
LVM informing patient that her MRI  brain result is normal for her age. Repeated message and left number for any questions.

## 2017-10-20 NOTE — Telephone Encounter (Signed)
Last visit: 05/13/17 Next visit: 11/13/17 Labs: 06/25/17 stable  Ok to refill per Dr. Corliss Skainseveshwar.

## 2017-10-22 ENCOUNTER — Other Ambulatory Visit: Payer: Self-pay

## 2017-10-22 DIAGNOSIS — E559 Vitamin D deficiency, unspecified: Secondary | ICD-10-CM

## 2017-10-23 ENCOUNTER — Telehealth: Payer: Self-pay

## 2017-10-23 LAB — VITAMIN D 25 HYDROXY (VIT D DEFICIENCY, FRACTURES): Vit D, 25-Hydroxy: 50 ng/mL (ref 30–100)

## 2017-10-23 NOTE — Telephone Encounter (Signed)
Called patient back, she is aware that muscle cramps is a possible side effect of Donepezil. She states that she has not gotten any sleep this week because of the cramps and she is taking it as prescribed, 0.5 tablet at night. I spoke with Dr. Lucia GaskinsAhern, she says for patient to stop taking medication and see if the cramps resolve. I called the patient back and she verbalized understanding. She states she will call her PCP if the cramps do not resolve and will keep our office updated.

## 2017-10-23 NOTE — Telephone Encounter (Signed)
Patient is calling because she was started on Donepezil 10mg  and shortly after that she started having leg cramps at night. The medication was decreased to 5mg  because it was also upsetting her stomach, but the leg cramps have continued. She is wondering if this could possibly be a side effect of the medication or should she see her PCP before the weekend? Please call pt back this morning if possible. Thanks!

## 2017-10-23 NOTE — Progress Notes (Signed)
Vitamin D is normal now. She should take vitamin D 2000 units every day to maintain her vitamin D levels. We should recheck her vitamin D in 6 months.

## 2017-11-04 NOTE — Progress Notes (Signed)
Office Visit Note  Patient: Andrea Heath             Date of Birth: 04-Aug-1946           MRN: [id number]             PCP: Clayborn Heron, MD Referring: Clayborn Heron, MD Visit Date: 11/13/2017 Occupation: @GUAROCC @    Subjective:  Raynauds, medication management   History of Present Illness: Andrea Heath is a 71 y.o. female with history of scleroderma and gout. She states that she's been having somewhat increased symptoms of Raynauds as the weather is changed. She saw Dr. Gala Romney in July and had no evidence of pulmonary hypertension. Her PFTs were stable except that DLCO could not be performed. She has not had any gout flare since the last visit.  Activities of Daily Living:  Patient reports morning stiffness for 0 minute.   Patient Denies nocturnal pain.  Difficulty dressing/grooming: Denies Difficulty climbing stairs: Denies Difficulty getting out of chair: Denies Difficulty using hands for taps, buttons, cutlery, and/or writing: Denies   Review of Systems  Constitutional: Negative for fatigue, night sweats, weight gain, weight loss and weakness.  HENT: Negative for mouth sores, trouble swallowing, trouble swallowing, mouth dryness and nose dryness.   Eyes: Negative for pain, redness, visual disturbance and dryness.  Respiratory: Negative for cough, shortness of breath and difficulty breathing.   Cardiovascular: Positive for hypertension. Negative for chest pain, palpitations, irregular heartbeat and swelling in legs/feet.  Gastrointestinal: Negative for blood in stool, constipation and diarrhea.  Endocrine: Negative for increased urination.  Genitourinary: Negative for vaginal dryness.  Musculoskeletal: Positive for arthralgias and joint pain. Negative for joint swelling, myalgias, muscle weakness, morning stiffness, muscle tenderness and myalgias.  Skin: Positive for color change and skin tightness. Negative for rash, hair loss, ulcers and sensitivity to  sunlight.  Allergic/Immunologic: Negative for susceptible to infections.  Neurological: Negative for dizziness, memory loss and night sweats.  Hematological: Negative for swollen glands.  Psychiatric/Behavioral: Negative for depressed mood and sleep disturbance. The patient is not nervous/anxious.     PMFS History:  Patient Active Problem List   Diagnosis Date Noted  . 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  . 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 Chronic gout of foot 11/09/2016  . Age related osteoporosis 11/09/2016  . Hypothyroidism 11/09/2016  . Gastroesophageal reflux 11/09/2016  . Chest pain 01/19/2016  . DM type 2 (diabetes mellitus, type 2) (HCC) 01/19/2016  . Leg cramp 12/12/2014  . Pain, joint, multiple sites 09/08/2012  . Night sweats 06/04/2012  . Eyelid dermatitis, eczematous 04/30/2012  . General medical examination 12/03/2011  . URI 11/14/2010  . FOOT, PAIN 04/19/2010  . SINUSITIS 12/12/2009  . HYPERKALEMIA 11/27/2009  . SKIN LESION 07/27/2009  . LEG CRAMPS, NOCTURNAL 01/26/2009  . DEPRESSIVE DISORDER 10/20/2008  . SCLERODERMA 07/11/2008  . SEBORRHEIC KERATOSIS 03/08/2008  . RHINITIS 12/02/2007  . DIABETES MELLITUS, TYPE II 01/21/2007  . Dyslipidemia 01/21/2007  . Essential hypertension 01/21/2007    Past Medical History:  Diagnosis Date  . Diabetes mellitus   . GERD (gastroesophageal reflux disease)   . Gout   . Hyperlipidemia   . Hypertension   . Keratosis seborrheica   . Macular degeneration of both eyes   . Osteoporosis   . Raynaud's disease   . Rhinitis   . Scleroderma (HCC)     Family History  Problem Relation Age of Onset  . Stroke Mother   . Heart attack Mother   .  Cancer Mother        breast cancer  . Breast cancer Mother [id number]  . Lung cancer Father   . Achalasia Brother   . Arrhythmia Brother   . Arrhythmia Brother   . Cancer Son 20       bladder cancer    Past Surgical History:  Procedure Laterality Date  . BREAST EXCISIONAL  BIOPSY Left 2005  . CHOLECYSTECTOMY    . MASTECTOMY PARTIAL / LUMPECTOMY     left breast   . TUBAL LIGATION     Social History   Social History Narrative  . Not on file     Objective: Vital Signs: BP (!) 160/81 (BP Location: Left Arm, Patient Position: Sitting, Cuff Size: Normal)   Pulse 89   Resp 15   Ht 5\' 7"  (1.702 m)   Wt 173 lb (78.5 kg)   BMI 27.10 kg/m    Physical Exam  Constitutional: She is oriented to person, place, and time. She appears well-developed and well-nourished.  HENT:  Head: Normocephalic and atraumatic.  Eyes: Conjunctivae and EOM are normal.  Neck: Normal range of motion.  Cardiovascular: Normal rate, regular rhythm, normal heart sounds and intact distal pulses.  Pulmonary/Chest: Effort normal and breath sounds normal.  Abdominal: Soft. Bowel sounds are normal.  Lymphadenopathy:    She has no cervical adenopathy.  Neurological: She is alert and oriented to person, place, and time.  Skin: Skin is warm and dry. Capillary refill takes 2 to 3 seconds.  No digital ulcers were noted. Nail bed capillary changes were noted. Few telangiectasias were noted.  sclerodactyly was noted.  Psychiatric: She has a normal mood and affect. Her behavior is normal.  Nursing note and vitals reviewed.    Musculoskeletal Exam: ome limitation with range of motion of her C-spine. She has thoracic kyphosis. Lumbar spine limitation of range of motion. Shoulder joints although joints wrist joint MCPs PIPs DIPs with good range of motion with no synovitis. Hip joints knee joints ankles MTPs PIPs with good range of motion. She does have bilateral hammertoes.  CDAI Exam: No CDAI exam completed.    Investigation: No additional findings. Uric acid: 06/25/2017: 4.2 CBC Latest Ref Rng & Units 06/25/2017 11/11/2016 06/28/2016  WBC 3.8 - 10.8 K/uL 5.4 7.0 5.3  Hemoglobin 11.7 - 15.5 g/dL 82.913.5 56.213.4 13.013.6  Hematocrit 35.0 - 45.0 % 41.4 39.6 41  Platelets 140 - 400 K/uL 208 256 186    CMP Latest Ref Rng & Units 06/25/2017 11/11/2016 06/28/2016  Glucose 65 - 99 mg/dL 865(H181(H) 846(N109(H) -  BUN 7 - 25 mg/dL 13 12 10   Creatinine 0.60 - 0.93 mg/dL 6.29(B1.05(H) 2.84(X1.08(H) 1.0  Sodium 135 - 146 mmol/L 138 139 139  Potassium 3.5 - 5.3 mmol/L 4.6 4.1 4.8  Chloride 98 - 110 mmol/L 103 105 -  CO2 20 - 31 mmol/L 23 25 -  Calcium 8.6 - 10.4 mg/dL 9.2 32.410.0 -  Total Protein 6.1 - 8.1 g/dL 6.8 7.2 -  Total Bilirubin 0.2 - 1.2 mg/dL 0.5 0.7 -  Alkaline Phos 33 - 130 U/L 54 56 50  AST 10 - 35 U/L 24 23 19   ALT 6 - 29 U/L 25 24 27     Imaging: Mr Brain Wo Contrast  Result Date: 10/17/2017  [facility] [address], [address] [place], [place] [id number] [phone] NEUROIMAGING REPORT STUDY DATE: 10/16/2017 PATIENT NAME: Gillis EndsBrenda W Penniman DOB: 10/09/1946 MRN: [id number] EXAM: MRI Brain without contrast ORDERING CLINICIAN: Naomie DeanAntonia Ahern  M.D. CLINICAL HISTORY: 71 year old woman with memory loss COMPARISON FILMS: None TECHNIQUE: MRI of the brain without contrast was obtained utilizing 5 mm axial slices with T1, T2, T2 flair, SWI and diffusion weighted views.  T1 sagittal and T2 coronal views were obtained. CONTRAST: none IMAGING SITE: [facility], [address], [place] FINDINGS: On sagittal images, the spinal cord is imaged caudally to C4 and is normal in caliber.   The contents of the posterior fossa are of normal size and position.   The pituitary gland and optic chiasm appear normal.    Brain volume appears normal.   The ventricles are normal in size and without distortion.  There are no abnormal extra-axial collections of fluid.  The cerebellum and brainstem appears normal.   The deep gray matter appears normal.  There are minimal deep white matter hyperintense signal foci consistent with minimal chronic progressive ischemic change..  Diffusion weighted images are normal.  Susceptibility weighted images are normal.  The orbits appear normal.   The VIIth/VIIIth nerve complex  appears normal.  The mastoid air cells appear normal.  The paranasal sinuses appear normal.  Flow voids are identified within the major intracerebral arteries.      This MRI of the brain without contrast shows the following: 1.     Age-appropriate mild generalized cortical atrophy. 2.     Age-appropriate minimal chronic microvessel ischemic change. 3.     There are no acute findings. INTERPRETING PHYSICIAN: Richard A. Epimenio FootSater, MD, PhD, FAAN Certified in  Neuroimaging by [employer]    Speciality Comments: No specialty comments available.    Procedures:  No procedures performed Allergies: Patient has no known allergies.   Assessment / Plan:     Visit Diagnoses: SCLERODERMA - Limited systemic sclerosis, sclerodactyly, Raynauds, Telengectesia's, positive ANA centromere, positive RF, positive CCP. -she's been having increased problems with Raynauds as cold weather is approaching. She has not noticed any increased tightness in her skin. I will check following labs to monitor the disease processes. Plan: CBC with Differential/Platelet, COMPLETE METABOLIC PANEL WITH GFR, Urinalysis, Routine w reflex microscopic  Raynaud's disease without gangrene - she has been off amlodipine. I will refill it today.  Idiopathic chronic gout of left foot without tophus - On allopurinol, colchicine Uric acid: 06/25/2017: 4.2: Patient denies any gout flare.she states that she's unable to get off colchicine.  Hyperuricemia - Plan: Uric acid  Age-related osteoporosis without current pathological fracture - July 2016 T score -2.5 left femoral neck, she is on Fosamax - Plan: VITAMIN D 25 Hydroxy (Vit-D Deficiency, Fractures) she will need repeat DEXA. As [facility] I will request her PCP to order that.  Postural kyphosis of thoracic region  SEBORRHEIC KERATOSIS  History of hypertension    Orders: Orders Placed This Encounter  Procedures  . CBC with Differential/Platelet   . COMPLETE METABOLIC PANEL WITH GFR  . Urinalysis, Routine w reflex microscopic  . Uric acid  . VITAMIN D 25 Hydroxy (Vit-D Deficiency, Fractures)   No orders of the defined types were placed in this encounter.   Face-to-face time spent with patient was 30 minutes. Greater than50% of time was spent in counseling and coordination of care.  Follow-Up Instructions: Return in about 6 months (around 05/13/2018) for Scleroderma, Gout,OP.   Pollyann SavoyShaili Anayely Constantine, MD  Note - This record has been created using Animal nutritionistDragon software.  Chart creation errors have been sought, but may not always  have been located. Such creation  errors do not reflect on  the standard of medical care.

## 2017-11-13 ENCOUNTER — Encounter: Payer: Self-pay | Admitting: Rheumatology

## 2017-11-13 ENCOUNTER — Ambulatory Visit (INDEPENDENT_AMBULATORY_CARE_PROVIDER_SITE_OTHER): Payer: Medicare PPO | Admitting: Rheumatology

## 2017-11-13 VITALS — BP 160/81 | HR 89 | Resp 15 | Ht 67.0 in | Wt 173.0 lb

## 2017-11-13 DIAGNOSIS — M810 Age-related osteoporosis without current pathological fracture: Secondary | ICD-10-CM

## 2017-11-13 DIAGNOSIS — Z8679 Personal history of other diseases of the circulatory system: Secondary | ICD-10-CM

## 2017-11-13 DIAGNOSIS — I7300 Raynaud's syndrome without gangrene: Secondary | ICD-10-CM

## 2017-11-13 DIAGNOSIS — L821 Other seborrheic keratosis: Secondary | ICD-10-CM

## 2017-11-13 DIAGNOSIS — E790 Hyperuricemia without signs of inflammatory arthritis and tophaceous disease: Secondary | ICD-10-CM | POA: Diagnosis not present

## 2017-11-13 DIAGNOSIS — M349 Systemic sclerosis, unspecified: Secondary | ICD-10-CM | POA: Diagnosis not present

## 2017-11-13 DIAGNOSIS — M1A0720 Idiopathic chronic gout, left ankle and foot, without tophus (tophi): Secondary | ICD-10-CM | POA: Diagnosis not present

## 2017-11-13 DIAGNOSIS — M4004 Postural kyphosis, thoracic region: Secondary | ICD-10-CM | POA: Diagnosis not present

## 2017-11-13 MED ORDER — AMLODIPINE BESYLATE 5 MG PO TABS: 5.0000 mg | ORAL_TABLET | Freq: Every day | ORAL | 1 refills | Status: DC

## 2017-11-14 LAB — COMPLETE METABOLIC PANEL WITH GFR
AG Ratio: 1.3 (calc) (ref 1.0–2.5)
ALKALINE PHOSPHATASE (APISO): 65 U/L (ref 33–130)
ALT: 28 U/L (ref 6–29)
AST: 22 U/L (ref 10–35)
Albumin: 4 g/dL (ref 3.6–5.1)
BILIRUBIN TOTAL: 0.5 mg/dL (ref 0.2–1.2)
BUN / CREAT RATIO: 13 (calc) (ref 6–22)
BUN: 13 mg/dL (ref 7–25)
CO2: 25 mmol/L (ref 20–32)
Calcium: 9.5 mg/dL (ref 8.6–10.4)
Chloride: 103 mmol/L (ref 98–110)
Creat: 1.02 mg/dL — ABNORMAL HIGH (ref 0.60–0.93)
GFR, Est African American: 64 mL/min/{1.73_m2} (ref >=60)
GFR, Est Non African American: 55 mL/min/{1.73_m2} — ABNORMAL LOW (ref >=60)
GLOBULIN: 3.2 g/dL (ref 1.9–3.7)
Glucose, Bld: 118 mg/dL — ABNORMAL HIGH (ref 65–99)
Potassium: 4.8 mmol/L (ref 3.5–5.3)
Sodium: 137 mmol/L (ref 135–146)
TOTAL PROTEIN: 7.2 g/dL (ref 6.1–8.1)

## 2017-11-14 LAB — CBC WITH DIFFERENTIAL/PLATELET
BASOS ABS: 12 {cells}/uL (ref 0–200)
BASOS PCT: 0.2 %
EOS ABS: 124 {cells}/uL (ref 15–500)
Eosinophils Relative: 2.1 %
HEMATOCRIT: 40.3 % (ref 35.0–45.0)
HEMOGLOBIN: 14 g/dL (ref 11.7–15.5)
LYMPHS ABS: 1617 {cells}/uL (ref 850–3900)
MCH: 31.1 pg (ref 27.0–33.0)
MCHC: 34.7 g/dL (ref 32.0–36.0)
MCV: 89.6 fL (ref 80.0–100.0)
MPV: 12.2 fL (ref 7.5–12.5)
Monocytes Relative: 7.4 %
NEUTROS ABS: 3711 {cells}/uL (ref 1500–7800)
Neutrophils Relative %: 62.9 %
Platelets: 243 10*3/uL (ref 140–400)
RBC: 4.5 10*6/uL (ref 3.80–5.10)
RDW: 13.3 % (ref 11.0–15.0)
Total Lymphocyte: 27.4 %
WBC: 5.9 10*3/uL (ref 3.8–10.8)
WBCMIX: 437 {cells}/uL (ref 200–950)

## 2017-11-14 LAB — URINALYSIS, ROUTINE W REFLEX MICROSCOPIC
Bacteria, UA: NONE SEEN /HPF
Bilirubin Urine: NEGATIVE
GLUCOSE, UA: NEGATIVE
HGB URINE DIPSTICK: NEGATIVE
Hyaline Cast: NONE SEEN /LPF
KETONES UR: NEGATIVE
NITRITE: NEGATIVE
Protein, ur: NEGATIVE
Specific Gravity, Urine: 1.015 (ref 1.001–1.03)
Squamous Epithelial / LPF: NONE SEEN /HPF (ref <=5)

## 2017-11-14 LAB — URIC ACID: URIC ACID, SERUM: 3.9 mg/dL (ref 2.5–7.0)

## 2017-11-14 LAB — VITAMIN D 25 HYDROXY (VIT D DEFICIENCY, FRACTURES): Vit D, 25-Hydroxy: 38 ng/mL (ref 30–100)

## 2017-11-18 ENCOUNTER — Other Ambulatory Visit: Payer: Self-pay | Admitting: *Deleted

## 2017-11-18 MED ORDER — COLCHICINE 0.6 MG PO TABS: 0.6000 mg | ORAL_TABLET | Freq: Every day | ORAL | 1 refills | Status: DC

## 2017-11-18 NOTE — Telephone Encounter (Signed)
Refill request received via fax  Last Visit: 11/13/17 Next Visit: 05/14/18 Labs: 11/13/17 stable  Okay to refill per Dr. Corliss Skainseveshwar

## 2017-11-27 ENCOUNTER — Other Ambulatory Visit: Payer: Self-pay | Admitting: Family Medicine

## 2017-11-27 DIAGNOSIS — M8580 Other specified disorders of bone density and structure, unspecified site: Secondary | ICD-10-CM

## 2017-12-15 ENCOUNTER — Telehealth (INDEPENDENT_AMBULATORY_CARE_PROVIDER_SITE_OTHER): Payer: Self-pay

## 2017-12-15 NOTE — Telephone Encounter (Signed)
Patient called concerning a Rx for amlodipine.

## 2017-12-15 NOTE — Telephone Encounter (Signed)
Patient stated that she would like for Amlodipine to be prescribed by her PCP only.

## 2018-01-29 ENCOUNTER — Ambulatory Visit: Payer: Self-pay | Admitting: Neurology

## 2018-01-30 ENCOUNTER — Other Ambulatory Visit: Payer: Self-pay | Admitting: Rheumatology

## 2018-01-30 NOTE — Telephone Encounter (Signed)
Last visit: 11/13/2017 Next visit: 05/14/2018 Labs: 11/13/2017 stable   Okay to refill per Dr. Corliss Skainseveshwar.

## 2018-02-02 ENCOUNTER — Telehealth: Payer: Self-pay | Admitting: Rheumatology

## 2018-02-02 NOTE — Telephone Encounter (Signed)
Patient called stating that her prescription for Colcrys which she takes for Gout increased to $140 for 30 pills because her insurance changed to [employer].  Patient states that this is the generic medicine and is asking if there is something else she can take that is less expensive.  [facility] [place].

## 2018-02-02 NOTE — Telephone Encounter (Signed)
Returned pts call. She has a PartD plan that will not allow her to use a copay card to help with her co-pay. Told her about the [employer] patient assistance application. She asked if I would ask call her back because her husband was on the phone.   Will call pt back.   Steffen Hase, [place], CPhT 4:28 PM

## 2018-02-03 DIAGNOSIS — Z460 Encounter for fitting and adjustment of spectacles and contact lenses: Secondary | ICD-10-CM | POA: Diagnosis not present

## 2018-02-03 NOTE — Telephone Encounter (Signed)
Spoke with pt. She is interested in apply to the [employer] [place] patient assistance program for Colcrys. She plans to come tomorrow to drop off her income documents and sign the application. Once all docs are received and provider portion is complete, we will fax application.   Representative states that the turnaround time is 24 to 48 hours.   Samyrah Bruster, [place], CPhT 9:39 AM

## 2018-02-04 ENCOUNTER — Telehealth: Payer: Self-pay

## 2018-02-04 ENCOUNTER — Other Ambulatory Visit: Payer: Self-pay | Admitting: Rheumatology

## 2018-02-04 NOTE — Telephone Encounter (Signed)
Medication Samples have been provided to the patient.  Drug name: Mitigare      Strength: 0.6mg         Qty: 2  LOT: [id number]  Exp.Date: 09/2018  Dosing instructions: Take 1 tablet by mouth daily.   The patient has been instructed regarding the correct time, dose, and frequency of taking this medication, including desired effects and most common side effects.   Cayce Paschal C Pina Sirianni 2:53 PM 02/04/2018

## 2018-02-04 NOTE — Telephone Encounter (Signed)
Last visit: 11/13/2017 Next visit: 05/14/2018 Labs: 11/13/2017 stable  Uric acid: 11/13/2017 3.9  Okay to refill per Dr. Corliss Skainseveshwar.

## 2018-02-05 ENCOUNTER — Other Ambulatory Visit: Payer: Self-pay | Admitting: Rheumatology

## 2018-02-16 ENCOUNTER — Telehealth: Payer: Self-pay

## 2018-02-16 NOTE — Telephone Encounter (Signed)
Called [employer] Pt [employer] to check the status of pts application. Spoke with Colin MuldersBrianna who states that they have not received her application yet. They program has been having some system issues the last couple of weeks .She gave me another fax number to use to send the application. [phone].   Will update once we receive a response.   Florina Glas, [place], CPhT 9:11 AM

## 2018-02-23 NOTE — Progress Notes (Addendum)
Office Visit Note  Patient: Andrea Heath             Date of Birth: 04/04/1946           MRN: [id number]             PCP: Clayborn Heron, MD Referring: Clayborn Heron, MD Visit Date: 02/24/2018 Occupation: @GUAROCC @    Subjective:  Left trochanteric bursitis   History of Present Illness: Andrea Heath is a 72 y.o. female with history of scleroderma, Raynaud's, osteoporosis, and gout.  She denies any recent gout flares.  She states she takes Colchicine 0.6 mg daily and allopurinol 300 mg daily.  She continues to have significant Raynaud's in her hands.  She wears gloves daily.  She denies any digital ulcers.  She continues to skin tightness but it is unchanged.  She denies any rashes.  She checks her blood pressure on a regular basis at home.  She sees her pulmonologist and cardiologist on a yearly basis.  She has been exercising at [employer] 3 times a week.  She denies any joint pain or joint swelling.  She has very little morning stiffness.  She has left trochanteric bursa pain.  She has been waking up at night due to the pain laying on her left side.   She continues to take Fosamax 70 mg once weekly.    Activities of Daily Living:  Patient reports morning stiffness for 5 minutes.   Patient Reports nocturnal pain.  Difficulty dressing/grooming: Denies Difficulty climbing stairs: Reports Difficulty getting out of chair: Denies Difficulty using hands for taps, buttons, cutlery, and/or writing: Denies   Review of Systems  Constitutional: Negative for fatigue and weakness.  HENT: Negative for mouth sores, mouth dryness and nose dryness.   Eyes: Negative for pain, redness, visual disturbance and dryness.  Respiratory: Negative for cough, hemoptysis, shortness of breath and difficulty breathing.   Cardiovascular: Negative for chest pain, palpitations, hypertension, irregular heartbeat and swelling in legs/feet.  Gastrointestinal: Negative for blood in stool, constipation  and diarrhea.  Endocrine: Negative for increased urination.  Genitourinary: Negative for painful urination.  Musculoskeletal: Positive for arthralgias, joint pain and morning stiffness. Negative for joint swelling, myalgias, muscle weakness, muscle tenderness and myalgias.  Skin: Positive for color change and skin tightness. Negative for pallor, rash, hair loss, nodules/bumps, redness, ulcers and sensitivity to sunlight.  Allergic/Immunologic: Negative for susceptible to infections.  Neurological: Negative for dizziness, numbness and headaches.  Hematological: Negative for swollen glands.  Psychiatric/Behavioral: Negative for depressed mood and sleep disturbance. The patient is not nervous/anxious.     PMFS History:  Patient Active Problem List   Diagnosis Date Noted  . 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  . 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 Chronic gout of foot 11/09/2016  . Age related osteoporosis 11/09/2016  . Hypothyroidism 11/09/2016  . Gastroesophageal reflux 11/09/2016  . Chest pain 01/19/2016  . DM type 2 (diabetes mellitus, type 2) (HCC) 01/19/2016  . Leg cramp 12/12/2014  . Pain, joint, multiple sites 09/08/2012  . Night sweats 06/04/2012  . Eyelid dermatitis, eczematous 04/30/2012  . General medical examination 12/03/2011  . URI 11/14/2010  . FOOT, PAIN 04/19/2010  . SINUSITIS 12/12/2009  . HYPERKALEMIA 11/27/2009  . SKIN LESION 07/27/2009  . LEG CRAMPS, NOCTURNAL 01/26/2009  . DEPRESSIVE DISORDER 10/20/2008  . SCLERODERMA 07/11/2008  . SEBORRHEIC KERATOSIS 03/08/2008  . RHINITIS 12/02/2007  . DIABETES MELLITUS, TYPE II 01/21/2007  . Dyslipidemia 01/21/2007  . Essential hypertension 01/21/2007  Past Medical History:  Diagnosis Date  . Diabetes mellitus   . GERD (gastroesophageal reflux disease)   . Gout   . Hyperlipidemia   . Hypertension   . Keratosis seborrheica   . Macular degeneration of both eyes   . Osteoporosis   . Raynaud's disease   .  Rhinitis   . Scleroderma (HCC)     Family History  Problem Relation Age of Onset  . Stroke Mother   . Heart attack Mother   . Cancer Mother        breast cancer  . Breast cancer Mother 8  . Lung cancer Father   . Achalasia Brother   . Arrhythmia Brother   . Arrhythmia Brother   . Cancer Son 20       bladder cancer    Past Surgical History:  Procedure Laterality Date  . BREAST EXCISIONAL BIOPSY Left 2005  . CHOLECYSTECTOMY    . MASTECTOMY PARTIAL / LUMPECTOMY     left breast   . TUBAL LIGATION     Social History   Social History Narrative  . Not on file     Objective: Vital Signs: BP 136/74 (BP Location: Left Arm, Patient Position: Sitting, Cuff Size: Normal)   Pulse 93   Resp 15   Ht 5' 6.5" (1.689 m)   Wt 171 lb (77.6 kg)   BMI 27.19 kg/m    Physical Exam  Constitutional: She is oriented to person, place, and time. She appears well-developed and well-nourished.  HENT:  Head: Normocephalic and atraumatic.  Eyes: Conjunctivae and EOM are normal.  Neck: Normal range of motion.  Cardiovascular: Normal rate, regular rhythm, normal heart sounds and intact distal pulses.  Pulmonary/Chest: Effort normal and breath sounds normal.  Abdominal: Soft. Bowel sounds are normal.  Lymphadenopathy:    She has no cervical adenopathy.  Neurological: She is alert and oriented to person, place, and time.  Skin: Skin is warm and dry. Capillary refill takes 2 to 3 seconds.  No digital ulcers were noted. Nail bed capillary changes were noted. Few telangiectasias were noted.  Sclerodactyly was noted.   Psychiatric: She has a normal mood and affect. Her behavior is normal.  Nursing note and vitals reviewed.    Musculoskeletal Exam: C-spine, thoracic, and lumbar spine good ROM.  Shoulder joints, elbow joints, wrist joints, MCPs, PIPs, and DIPs good ROM with no synovitis.  Hip joints, knee joints, ankle joints, MTPs, PIPs, and DIPs good ROM with no synovitis.  She has bilateral  hammertoes.  Left trochanteric bursa tenderness.  No tenderness of right trochanteric bursa.    CDAI Exam: No CDAI exam completed.    Investigation: No additional findings.Uric acid: 06/25/2017 4.2 CBC Latest Ref Rng & Units 11/13/2017 06/25/2017 11/11/2016  WBC 3.8 - 10.8 Thousand/uL 5.9 5.4 7.0  Hemoglobin 11.7 - 15.5 g/dL 09.8 11.9 14.7  Hematocrit 35.0 - 45.0 % 40.3 41.4 39.6  Platelets 140 - 400 Thousand/uL 243 208 256   CMP Latest Ref Rng & Units 11/13/2017 06/25/2017 11/11/2016  Glucose 65 - 99 mg/dL 829(F) 621(H) 086(V)  BUN 7 - 25 mg/dL 13 13 12   Creatinine 0.60 - 0.93 mg/dL 7.84(O) 9.62(X) 5.28(U)  Sodium 135 - 146 mmol/L 137 138 139  Potassium 3.5 - 5.3 mmol/L 4.8 4.6 4.1  Chloride 98 - 110 mmol/L 103 103 105  CO2 20 - 32 mmol/L 25 23 25   Calcium 8.6 - 10.4 mg/dL 9.5 9.2 13.2  Total Protein 6.1 - 8.1 g/dL 7.2 6.8  7.2  Total Bilirubin 0.2 - 1.2 mg/dL 0.5 0.5 0.7  Alkaline Phos 33 - 130 U/L - 54 56  AST 10 - 35 U/L 22 24 23   ALT 6 - 29 U/L 28 25 24     Imaging: No results found.  Speciality Comments: No specialty comments available.    Procedures:  Large Joint Inj: L greater trochanter on 02/24/2018 9:45 AM Indications: pain Details: 27 G 1.5 in needle, lateral approach  Arthrogram: No  Medications: 1 mL lidocaine 1 %; 40 mg triamcinolone acetonide 40 MG/ML Aspirate: 0 mL Outcome: tolerated well, no immediate complications Procedure, treatment alternatives, risks and benefits explained, specific risks discussed. Consent was given by the patient. Immediately prior to procedure a time out was called to verify the correct patient, procedure, equipment, support staff and site/side marked as required. Patient was prepped and draped in the usual sterile fashion.     Allergies: Patient has no known allergies.   Assessment / Plan:     Visit Diagnoses: SCLERODERMA - Limited systemic sclerosis, sclerodactyly, Raynauds, Telengectesia's, positive ANA centromere, positive  RF, positive CCP: She continues to have issues with her Raynaud's.  She wears gloves regularly.  Her skin tightness is unchanged.  She continues to be followed by her cardiologist and pulmonologist on a yearly basis.  She also continues to monitor her blood pressure at home daily.   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  No digital ulcers noted. No signs of gangrene.  She continues to wear gloves and socks daily.   Idiopathic chronic gout of left foot without tophus - She has not had any recent gout flares.  She will continue to take allopurinol 300 mg and colchicine 0.6 mg daily.  She was given a sample of Mitigare due to her insurance no longer covering colchicine.  Uric acid: 06/25/2017 was 4.2.  Uric acid level was checked today. - Plan: Uric acid, CBC with Differential/Platelet, COMPLETE METABOLIC PANEL WITH GFR  Hyperuricemia - Uric acid was WNL on 06/25/17.  Uric acid level was obtained today with lab work. Plan: Uric acid  Age-related osteoporosis without current pathological fracture - July 2016 T score -2.5 left femoral neck, she is on Fosamax 70 mg weekly.    Postural kyphosis of thoracic region  Seborrheic keratosis  History of hypertension: She was encouraged to continue to monitor her blood pressure closely.    History of diabetes mellitus, type II: She was encouraged to check her blood glucose regularly.    Trochanteric bursitis, left hip - She has tenderness to palpation of her left trochanteric bursa.  No groin pain and good ROM of her left hip on exam.  She requested a cortisone injection today in the office.  She reports her previous cortisone injection in October 2018 helped considerably.  She tolerated the procedure well today.  She was advised to monitor her blood pressure very closely.  She was also reminded to avoid PO prednisone due to the increased risk for hypertensive emergency due to history of limited sclerosis. Plan: Large Joint Inj: L greater  trochanter    Orders: Orders Placed This Encounter  Procedures  . Large Joint Inj: L greater trochanter  . Uric acid  . CBC with Differential/Platelet  . COMPLETE METABOLIC PANEL WITH GFR   No orders of the defined types were placed in this encounter.   Face-to-face time spent with patient was 30 minutes. Greater than 50% of time was spent in counseling and coordination of care.  Follow-Up Instructions: Return for Scleroderma, Gout, Osteoporosis.   Gearldine Bienenstockaylor M Dale, PA-C   I examined and evaluated the patient with Sherron Alesaylor Dale PA. The plan of care was discussed as noted above.  Pollyann SavoyShaili Deveshwar, MD  Note - This record has been created using Animal nutritionistDragon software.  Chart creation errors have been sought, but may not always  have been located. Such creation errors do not reflect on  the standard of medical care.

## 2018-02-24 ENCOUNTER — Encounter: Payer: Self-pay | Admitting: Physician Assistant

## 2018-02-24 ENCOUNTER — Ambulatory Visit: Payer: Medicare HMO | Admitting: Rheumatology

## 2018-02-24 VITALS — BP 136/74 | HR 93 | Resp 15 | Ht 66.5 in | Wt 171.0 lb

## 2018-02-24 DIAGNOSIS — Z8639 Personal history of other endocrine, nutritional and metabolic disease: Secondary | ICD-10-CM

## 2018-02-24 DIAGNOSIS — M349 Systemic sclerosis, unspecified: Secondary | ICD-10-CM

## 2018-02-24 DIAGNOSIS — E790 Hyperuricemia without signs of inflammatory arthritis and tophaceous disease: Secondary | ICD-10-CM | POA: Diagnosis not present

## 2018-02-24 DIAGNOSIS — I7300 Raynaud's syndrome without gangrene: Secondary | ICD-10-CM

## 2018-02-24 DIAGNOSIS — M7062 Trochanteric bursitis, left hip: Secondary | ICD-10-CM

## 2018-02-24 DIAGNOSIS — L821 Other seborrheic keratosis: Secondary | ICD-10-CM

## 2018-02-24 DIAGNOSIS — Z8679 Personal history of other diseases of the circulatory system: Secondary | ICD-10-CM

## 2018-02-24 DIAGNOSIS — M1A0720 Idiopathic chronic gout, left ankle and foot, without tophus (tophi): Secondary | ICD-10-CM

## 2018-02-24 DIAGNOSIS — M4004 Postural kyphosis, thoracic region: Secondary | ICD-10-CM | POA: Diagnosis not present

## 2018-02-24 DIAGNOSIS — M810 Age-related osteoporosis without current pathological fracture: Secondary | ICD-10-CM

## 2018-02-24 MED ORDER — TRIAMCINOLONE ACETONIDE 40 MG/ML IJ SUSP
40.0000 mg | INTRAMUSCULAR | Status: AC | PRN
  Administered 2018-02-24: 40 mg via INTRA_ARTICULAR

## 2018-02-24 MED ORDER — LIDOCAINE HCL 1 % IJ SOLN
1.0000 mL | INTRAMUSCULAR | Status: AC | PRN
  Administered 2018-02-24: 1 mL

## 2018-02-24 NOTE — Progress Notes (Signed)
Medication Samples have been provided to the patient.  Drug name: Mitigare      Strength: 0.6mg        Qty: 2  LOT: [id number]  Exp.Date: 09/2018  Dosing instructions: Take 1 tablet by mouth daily.   The patient has been instructed regarding the correct time, dose, and frequency of taking this medication, including desired effects and most common side effects.   Ellen HenriMarissa C Dezmond Downie 9:36 AM 02/24/2018

## 2018-02-24 NOTE — Telephone Encounter (Signed)
Called [employer] to check the status of pts application. Spoke with Colin MuldersBrianna who states that they still have not received the proof of income. She states that once all doc have been received and reviewed it will take about 3 to 7 business days for a response. Faxed pts proof of income to her supervisors fax [phone].   Spoke with pt at office visit. Will update once we have a response.   Patient voices understanding and denies any questions at this time.  Vilma Will, [place], CPhT 10:14 AM

## 2018-02-24 NOTE — Telephone Encounter (Signed)
[facility] stating that pt has been approved to receive free medicine through the program until 12/29/2018.   Will send document to scan center.   Called pt to update. Pt voices understanding and denies any questions at this time.   Princess Karnes, [place], CPhT 11:09 AM

## 2018-02-25 ENCOUNTER — Telehealth: Payer: Self-pay | Admitting: Rheumatology

## 2018-02-25 LAB — COMPLETE METABOLIC PANEL WITH GFR
AG Ratio: 1.4 (calc) (ref 1.0–2.5)
ALKALINE PHOSPHATASE (APISO): 76 U/L (ref 33–130)
ALT: 30 U/L — AB (ref 6–29)
AST: 23 U/L (ref 10–35)
Albumin: 4.1 g/dL (ref 3.6–5.1)
BUN/Creatinine Ratio: 11 (calc) (ref 6–22)
BUN: 11 mg/dL (ref 7–25)
CO2: 25 mmol/L (ref 20–32)
CREATININE: 1.03 mg/dL — AB (ref 0.60–0.93)
Calcium: 9.7 mg/dL (ref 8.6–10.4)
Chloride: 106 mmol/L (ref 98–110)
GFR, Est African American: 63 mL/min/{1.73_m2} (ref >=60)
GFR, Est Non African American: 54 mL/min/{1.73_m2} — ABNORMAL LOW (ref >=60)
GLOBULIN: 3 g/dL (ref 1.9–3.7)
GLUCOSE: 224 mg/dL — AB (ref 65–99)
Potassium: 4.5 mmol/L (ref 3.5–5.3)
SODIUM: 141 mmol/L (ref 135–146)
Total Bilirubin: 0.6 mg/dL (ref 0.2–1.2)
Total Protein: 7.1 g/dL (ref 6.1–8.1)

## 2018-02-25 LAB — CBC WITH DIFFERENTIAL/PLATELET
BASOS ABS: 11 {cells}/uL (ref 0–200)
BASOS PCT: 0.2 %
EOS ABS: 39 {cells}/uL (ref 15–500)
Eosinophils Relative: 0.7 %
HCT: 41.6 % (ref 35.0–45.0)
HEMOGLOBIN: 14 g/dL (ref 11.7–15.5)
Lymphs Abs: 1546 cells/uL (ref 850–3900)
MCH: 30.8 pg (ref 27.0–33.0)
MCHC: 33.7 g/dL (ref 32.0–36.0)
MCV: 91.4 fL (ref 80.0–100.0)
MPV: 12.9 fL — ABNORMAL HIGH (ref 7.5–12.5)
Monocytes Relative: 7 %
Neutro Abs: 3612 cells/uL (ref 1500–7800)
Neutrophils Relative %: 64.5 %
PLATELETS: 224 10*3/uL (ref 140–400)
RBC: 4.55 10*6/uL (ref 3.80–5.10)
RDW: 12.8 % (ref 11.0–15.0)
TOTAL LYMPHOCYTE: 27.6 %
WBC: 5.6 10*3/uL (ref 3.8–10.8)
WBCMIX: 392 {cells}/uL (ref 200–950)

## 2018-02-25 LAB — URIC ACID: URIC ACID, SERUM: 4.4 mg/dL (ref 2.5–7.0)

## 2018-02-25 NOTE — Progress Notes (Signed)
Uric acid within desirable range.  Glucose is elevated. Please advise her to continue to monitor her blood glucose closely.  Creat and GFR are stable.  We will continue to monitor.   ALT borderline elevated.  Due to renal functions and ALT elevation, please advise her to avoid NSAIDs, tylenol, and alcohol.

## 2018-02-25 NOTE — Telephone Encounter (Signed)
Returned patient's call and advised patient that the injection can take up to 3-4 days before she feels relief. Patient verbalized understanding.

## 2018-02-25 NOTE — Telephone Encounter (Signed)
Patient called stating that she got the injection in her left hip yesterday but still has a little pain.   Patient wanted to make sure this is normal because the first injection she received she had immediate relief.

## 2018-02-27 ENCOUNTER — Telehealth: Payer: Self-pay | Admitting: Rheumatology

## 2018-02-27 NOTE — Telephone Encounter (Signed)
Patient called stating that she is returning a call to our office.

## 2018-02-27 NOTE — Telephone Encounter (Signed)
Reviewed lab results and recommendations with patient.

## 2018-03-20 ENCOUNTER — Other Ambulatory Visit: Payer: Self-pay | Admitting: Family Medicine

## 2018-03-20 DIAGNOSIS — Z139 Encounter for screening, unspecified: Secondary | ICD-10-CM

## 2018-04-30 ENCOUNTER — Ambulatory Visit
Admission: RE | Admit: 2018-04-30 | Discharge: 2018-04-30 | Disposition: A | Discharge status: Home / Self Care | Payer: Medicare PPO | Source: Ambulatory Visit | Attending: Family Medicine | Admitting: Family Medicine

## 2018-04-30 ENCOUNTER — Ambulatory Visit
Admission: RE | Admit: 2018-04-30 | Discharge: 2018-04-30 | Disposition: A | Discharge status: Home / Self Care | Payer: Medicare HMO | Source: Ambulatory Visit | Attending: Family Medicine | Admitting: Family Medicine

## 2018-04-30 DIAGNOSIS — Z780 Asymptomatic menopausal state: Secondary | ICD-10-CM | POA: Diagnosis not present

## 2018-04-30 DIAGNOSIS — Z139 Encounter for screening, unspecified: Secondary | ICD-10-CM

## 2018-04-30 DIAGNOSIS — M8589 Other specified disorders of bone density and structure, multiple sites: Secondary | ICD-10-CM | POA: Diagnosis not present

## 2018-04-30 DIAGNOSIS — Z1231 Encounter for screening mammogram for malignant neoplasm of breast: Secondary | ICD-10-CM | POA: Diagnosis not present

## 2018-04-30 DIAGNOSIS — M8580 Other specified disorders of bone density and structure, unspecified site: Secondary | ICD-10-CM

## 2018-05-09 ENCOUNTER — Other Ambulatory Visit: Payer: Self-pay | Admitting: Rheumatology

## 2018-05-11 NOTE — Telephone Encounter (Signed)
Last Visit: 02/24/18 Next visit: 08/25/18 Labs: 02/24/18 Glucose is elevated. Creat and GFR are stable. We will continue to monitor. ALT borderline elevated  Okay to refill per Dr. Corliss Skains

## 2018-05-14 ENCOUNTER — Ambulatory Visit: Payer: Medicare PPO | Admitting: Rheumatology

## 2018-05-21 DIAGNOSIS — R69 Illness, unspecified: Secondary | ICD-10-CM | POA: Diagnosis not present

## 2018-05-23 ENCOUNTER — Other Ambulatory Visit: Payer: Self-pay | Admitting: Rheumatology

## 2018-06-01 NOTE — Progress Notes (Signed)
Office Visit Note  Patient: Andrea Heath             Date of Birth: November 23, 1946           MRN: [id number]             PCP: Clayborn Heron, MD Referring: Clayborn Heron, MD Visit Date: 06/02/2018 Occupation: @GUAROCC @    Subjective:  Left hip pain.   History of Present Illness: Andrea Heath is a 72 y.o. female with history of a scleroderma, gout and osteoporosis.  She states that she has been having a lot of discomfort in her left trochanteric bursa.  Her last injection was in February 2019 which did not last for very long.  She states she has graduation coming up for her granddaughter and she wants to drive to [place].  She will have difficulty sitting in the car for that long.  Of the other joints are painful.  Not had a gout flare in a long time.  Raynauds is not active currently.  Activities of Daily Living:  Patient reports morning stiffness for 10 minutes.   Patient Denies nocturnal pain.  Difficulty dressing/grooming: Denies Difficulty climbing stairs: Denies Difficulty getting out of chair: Denies Difficulty using hands for taps, buttons, cutlery, and/or writing: Denies   Review of Systems  Constitutional: Negative for fatigue, night sweats, weight gain and weight loss.  HENT: Negative for mouth sores, trouble swallowing, trouble swallowing, mouth dryness and nose dryness.   Eyes: Negative for pain, redness, visual disturbance and dryness.  Respiratory: Negative for cough, shortness of breath and difficulty breathing.   Cardiovascular: Negative for chest pain, palpitations, hypertension, irregular heartbeat and swelling in legs/feet.  Gastrointestinal: Negative for blood in stool, constipation and diarrhea.  Endocrine: Negative for increased urination.  Genitourinary: Negative for vaginal dryness.  Musculoskeletal: Positive for arthralgias and joint pain. Negative for joint swelling, myalgias, muscle weakness, morning stiffness, muscle tenderness and  myalgias.  Skin: Negative for color change, rash, hair loss, skin tightness, ulcers and sensitivity to sunlight.  Allergic/Immunologic: Negative for susceptible to infections.  Neurological: Negative for dizziness, memory loss, night sweats and weakness.  Hematological: Negative for swollen glands.  Psychiatric/Behavioral: Negative for depressed mood and sleep disturbance. The patient is not nervous/anxious.     PMFS History:  Patient Active Problem List   Diagnosis Date Noted  . 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  . 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 Chronic gout of foot 11/09/2016  . Age related osteoporosis 11/09/2016  . Hypothyroidism 11/09/2016  . Gastroesophageal reflux 11/09/2016  . Chest pain 01/19/2016  . DM type 2 (diabetes mellitus, type 2) (HCC) 01/19/2016  . Leg cramp 12/12/2014  . Pain, joint, multiple sites 09/08/2012  . Night sweats 06/04/2012  . Eyelid dermatitis, eczematous 04/30/2012  . General medical examination 12/03/2011  . URI 11/14/2010  . FOOT, PAIN 04/19/2010  . SINUSITIS 12/12/2009  . HYPERKALEMIA 11/27/2009  . SKIN LESION 07/27/2009  . LEG CRAMPS, NOCTURNAL 01/26/2009  . DEPRESSIVE DISORDER 10/20/2008  . SCLERODERMA 07/11/2008  . SEBORRHEIC KERATOSIS 03/08/2008  . RHINITIS 12/02/2007  . DIABETES MELLITUS, TYPE II 01/21/2007  . Dyslipidemia 01/21/2007  . Essential hypertension 01/21/2007    Past Medical History:  Diagnosis Date  . Diabetes mellitus   . GERD (gastroesophageal reflux disease)   . Gout   . Hyperlipidemia   . Hypertension   . Keratosis seborrheica   . Macular degeneration of both eyes   . Osteoporosis   . Raynaud's  disease   . Rhinitis   . Scleroderma (HCC)     Family History  Problem Relation Age of Onset  . Stroke Mother   . Heart attack Mother   . Cancer Mother        breast cancer  . Breast cancer Mother [id number]  . Lung cancer Father   . Achalasia Brother   . Arrhythmia Brother   . Arrhythmia Brother   .  Cancer Son 20       bladder cancer    Past Surgical History:  Procedure Laterality Date  . BREAST EXCISIONAL BIOPSY Left 2005  . CHOLECYSTECTOMY    . MASTECTOMY PARTIAL / LUMPECTOMY     left breast   . TUBAL LIGATION     Social History   Social History Narrative  . Not on file     Objective: Vital Signs: BP (!) 153/88 (BP Location: Left Arm, Patient Position: Sitting, Cuff Size: Normal)   Pulse 78   Resp 14   Ht 5' 6.5" (1.689 m)   Wt 177 lb (80.3 kg)   BMI 28.14 kg/m    Physical Exam  Constitutional: She is oriented to person, place, and time. She appears well-developed and well-nourished.  HENT:  Head: Normocephalic and atraumatic.  Eyes: Conjunctivae and EOM are normal.  Neck: Normal range of motion.  Cardiovascular: Normal rate, regular rhythm, normal heart sounds and intact distal pulses.  Pulmonary/Chest: Effort normal and breath sounds normal.  Abdominal: Soft. Bowel sounds are normal.  Lymphadenopathy:    She has no cervical adenopathy.  Neurological: She is alert and oriented to person, place, and time.  Skin: Skin is warm and dry. Capillary refill takes less than 2 seconds.  Sclerodactyly, telangiectasias were noted.  Psychiatric: She has a normal mood and affect. Her behavior is normal.  Nursing note and vitals reviewed.    Musculoskeletal Exam: C-spine thoracic lumbar spine good range of motion.  Shoulder joints elbow joints wrist joints with good range of motion.  She has difficulty making complete fist due to sclerodactyly.  Hip joints, knee joints, ankles MTPs PIPs with good range of motion.  She has tenderness over left trochanteric bursa consistent with trochanteric bursitis.  CDAI Exam: No CDAI exam completed.    Investigation: No additional findings. Uric acid: 02/24/2018 4.4 CBC Latest Ref Rng & Units 02/24/2018 11/13/2017 06/25/2017  WBC 3.8 - 10.8 Thousand/uL 5.6 5.9 5.4  Hemoglobin 11.7 - 15.5 g/dL 56.214.0 13.014.0 86.513.5  Hematocrit 35.0 - 45.0 %  41.6 40.3 41.4  Platelets 140 - 400 Thousand/uL 224 243 208   CMP Latest Ref Rng & Units 02/24/2018 11/13/2017 06/25/2017  Glucose 65 - 99 mg/dL 784(O224(H) 962(X118(H) 528(U181(H)  BUN 7 - 25 mg/dL 11 13 13   Creatinine 0.60 - 0.93 mg/dL 1.32(G1.03(H) 4.01(U1.02(H) 2.72(Z1.05(H)  Sodium 135 - 146 mmol/L 141 137 138  Potassium 3.5 - 5.3 mmol/L 4.5 4.8 4.6  Chloride 98 - 110 mmol/L 106 103 103  CO2 20 - 32 mmol/L 25 25 23   Calcium 8.6 - 10.4 mg/dL 9.7 9.5 9.2  Total Protein 6.1 - 8.1 g/dL 7.1 7.2 6.8  Total Bilirubin 0.2 - 1.2 mg/dL 0.6 0.5 0.5  Alkaline Phos 33 - 130 U/L - - 54  AST 10 - 35 U/L 23 22 24   ALT 6 - 29 U/L 30(H) 28 25    Imaging: No results found.  Speciality Comments: No specialty comments available.    Procedures:  Large Joint Inj: L greater trochanter on 06/02/2018 3:01  PM Indications: pain Details: 27 G 1.5 in needle, lateral approach  Arthrogram: No  Medications: 1.5 mL lidocaine (PF) 1 %; 40 mg triamcinolone acetonide 40 MG/ML Aspirate: 0 mL Outcome: tolerated well, no immediate complications Procedure, treatment alternatives, risks and benefits explained, specific risks discussed. Consent was given by the patient. Immediately prior to procedure a time out was called to verify the correct patient, procedure, equipment, support staff and site/side marked as required. Patient was prepped and draped in the usual sterile fashion.     Allergies: Patient has no known allergies.   Assessment / Plan:     Visit Diagnoses: SCLERODERMA - Limited systemic sclerosis, sclerodactyly, Raynauds, Telengectesia's, positive ANA centromere, positive RF, positive CCP.  She has not having any flare of her symptoms.  Her scleroderma has been stable.  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  Her hands were nice and warm to touch today.  The warmer weather has helped her symptoms.  Trochanteric bursitis, left hip - Plan: Large Joint Inj: L greater trochanter.  She has recurrence of trochanteric  bursitis.  Need for going for physical therapy was emphasized.  Prescription for physical therapy was given.  Per her request left trochanteric bursa was injected with cortisone as she is going for her granddaughter's graduation.  Side effects were discussed.  The procedure as described above.  Idiopathic chronic gout of left foot without tophus - allopurinol 300 mg and colchicine 0.6 mg daily.Uric acid: 02/24/2018 4.4.  Patient has not had any gout flares.  I will check her uric acid again in August.  Hyperuricemia-her uric acid is in desirable range on allopurinol.  Age-related osteoporosis without current pathological fracture - July 2016 T score -2.5 left femoral neck, she is on Fosamax 70 mg weekly..  Patient states she had recent bone density.  Postural kyphosis of thoracic region  Seborrheic keratosis  History of diabetes mellitus, type II  History of hypertension    Orders: Orders Placed This Encounter  Procedures  . Large Joint Inj: L greater trochanter   No orders of the defined types were placed in this encounter.   Face-to-face time spent with patient was 30 minutes.> 50% of time was spent in counseling and coordination of care.  Follow-Up Instructions: Return in about 5 months (around 11/02/2018) for Scleroderma, Gout.   Pollyann Savoy, MD  Note - This record has been created using Animal nutritionist.  Chart creation errors have been sought, but may not always  have been located. Such creation errors do not reflect on  the standard of medical care.

## 2018-06-02 ENCOUNTER — Ambulatory Visit (INDEPENDENT_AMBULATORY_CARE_PROVIDER_SITE_OTHER): Payer: Medicare HMO | Admitting: Rheumatology

## 2018-06-02 ENCOUNTER — Ambulatory Visit: Payer: BC Managed Care – PPO | Admitting: Rheumatology

## 2018-06-02 ENCOUNTER — Encounter: Payer: Self-pay | Admitting: Rheumatology

## 2018-06-02 VITALS — BP 153/88 | HR 78 | Resp 14 | Ht 66.5 in | Wt 177.0 lb

## 2018-06-02 DIAGNOSIS — M810 Age-related osteoporosis without current pathological fracture: Secondary | ICD-10-CM

## 2018-06-02 DIAGNOSIS — M1A0720 Idiopathic chronic gout, left ankle and foot, without tophus (tophi): Secondary | ICD-10-CM | POA: Diagnosis not present

## 2018-06-02 DIAGNOSIS — M7062 Trochanteric bursitis, left hip: Secondary | ICD-10-CM | POA: Diagnosis not present

## 2018-06-02 DIAGNOSIS — M4004 Postural kyphosis, thoracic region: Secondary | ICD-10-CM | POA: Diagnosis not present

## 2018-06-02 DIAGNOSIS — Z8679 Personal history of other diseases of the circulatory system: Secondary | ICD-10-CM

## 2018-06-02 DIAGNOSIS — E790 Hyperuricemia without signs of inflammatory arthritis and tophaceous disease: Secondary | ICD-10-CM | POA: Diagnosis not present

## 2018-06-02 DIAGNOSIS — L821 Other seborrheic keratosis: Secondary | ICD-10-CM | POA: Diagnosis not present

## 2018-06-02 DIAGNOSIS — Z8639 Personal history of other endocrine, nutritional and metabolic disease: Secondary | ICD-10-CM

## 2018-06-02 DIAGNOSIS — I7300 Raynaud's syndrome without gangrene: Secondary | ICD-10-CM | POA: Diagnosis not present

## 2018-06-02 DIAGNOSIS — M349 Systemic sclerosis, unspecified: Secondary | ICD-10-CM | POA: Diagnosis not present

## 2018-06-02 MED ORDER — TRIAMCINOLONE ACETONIDE 40 MG/ML IJ SUSP
40.0000 mg | INTRAMUSCULAR | Status: AC | PRN
  Administered 2018-06-02: 40 mg via INTRA_ARTICULAR

## 2018-06-02 MED ORDER — LIDOCAINE HCL (PF) 1 % IJ SOLN
1.5000 mL | INTRAMUSCULAR | Status: AC | PRN
  Administered 2018-06-02: 1.5 mL

## 2018-06-02 NOTE — Patient Instructions (Signed)
CBC, CMP, Uric acid in August

## 2018-06-03 DIAGNOSIS — I10 Essential (primary) hypertension: Secondary | ICD-10-CM | POA: Diagnosis not present

## 2018-06-03 DIAGNOSIS — E119 Type 2 diabetes mellitus without complications: Secondary | ICD-10-CM | POA: Diagnosis not present

## 2018-06-03 DIAGNOSIS — Z7984 Long term (current) use of oral hypoglycemic drugs: Secondary | ICD-10-CM | POA: Diagnosis not present

## 2018-06-03 DIAGNOSIS — E039 Hypothyroidism, unspecified: Secondary | ICD-10-CM | POA: Diagnosis not present

## 2018-06-03 DIAGNOSIS — E7800 Pure hypercholesterolemia, unspecified: Secondary | ICD-10-CM | POA: Diagnosis not present

## 2018-06-30 ENCOUNTER — Other Ambulatory Visit: Payer: Self-pay | Admitting: Rheumatology

## 2018-06-30 NOTE — Telephone Encounter (Signed)
Last Visit: 06/02/18 Next visit: 11/03/18  Okay to refill per Dr. Corliss Skainseveshwar

## 2018-08-18 ENCOUNTER — Other Ambulatory Visit: Payer: Self-pay | Admitting: Rheumatology

## 2018-08-18 NOTE — Telephone Encounter (Signed)
Last Visit: 06/02/18 Next visit: 11/03/18 Labs: 02/24/18 Glucose is elevated. Creat and GFR are stable. ALT borderline elevated  Okay to refill per Dr. Corliss Skainseveshwar

## 2018-08-23 ENCOUNTER — Other Ambulatory Visit: Payer: Self-pay | Admitting: Rheumatology

## 2018-08-24 NOTE — Telephone Encounter (Addendum)
Last Visit: 06/02/18 Next visit: 11/03/18 Labs: 02/24/18 Creat and GFR are stable. Glucose is elevated. ALT borderline elevated  Left message to advise patient she is due for lab.   Okay to refill 30 day supply per Dr. Corliss Skainseveshwar

## 2018-08-25 ENCOUNTER — Ambulatory Visit: Payer: BC Managed Care – PPO | Admitting: Rheumatology

## 2018-10-01 ENCOUNTER — Other Ambulatory Visit: Payer: Self-pay | Admitting: Rheumatology

## 2018-10-01 NOTE — Telephone Encounter (Signed)
Last Visit: 06/02/18 Next visit: 11/03/18  Okay to refill per Dr. Deveshwar 

## 2018-10-13 DIAGNOSIS — Z23 Encounter for immunization: Secondary | ICD-10-CM | POA: Diagnosis not present

## 2018-11-03 ENCOUNTER — Ambulatory Visit: Payer: Medicare HMO | Admitting: Rheumatology

## 2018-11-10 DIAGNOSIS — E119 Type 2 diabetes mellitus without complications: Secondary | ICD-10-CM | POA: Diagnosis not present

## 2018-11-10 DIAGNOSIS — Z7984 Long term (current) use of oral hypoglycemic drugs: Secondary | ICD-10-CM | POA: Diagnosis not present

## 2018-11-10 DIAGNOSIS — K529 Noninfective gastroenteritis and colitis, unspecified: Secondary | ICD-10-CM | POA: Diagnosis not present

## 2018-12-25 ENCOUNTER — Other Ambulatory Visit: Payer: Self-pay | Admitting: Rheumatology

## 2018-12-25 NOTE — Telephone Encounter (Signed)
ok 

## 2018-12-25 NOTE — Telephone Encounter (Addendum)
Last Visit: 06/02/18 Next visit: 01/12/19 Labs: 02/24/18 Uric acid within desirable range.  Glucose is elevatedCreat and GFR are stable.  ALT borderline elevated  Left message to advise patient she is due to update labs.   Okay to refill 30 day supply Allopurinol and Amlodipine?

## 2018-12-29 NOTE — Progress Notes (Deleted)
Office Visit Note  Patient: Andrea Heath             Date of Birth: 10/04/1946           MRN: [id number]             PCP: Clayborn Heronankins, [name], MD Referring: Clayborn Heronankins, [name], MD Visit Date: 01/12/2019 Occupation: @GUAROCC @  Subjective:  No chief complaint on file.   History of Present Illness: Andrea Heath is a 72 y.o. female ***   Activities of Daily Living:  Patient reports morning stiffness for *** {minute/hour:19697}.   Patient {ACTIONS;DENIES/REPORTS:21021675::"Denies"} nocturnal pain.  Difficulty dressing/grooming: {ACTIONS;DENIES/REPORTS:21021675::"Denies"} Difficulty climbing stairs: {ACTIONS;DENIES/REPORTS:21021675::"Denies"} Difficulty getting out of chair: {ACTIONS;DENIES/REPORTS:21021675::"Denies"} Difficulty using hands for taps, buttons, cutlery, and/or writing: {ACTIONS;DENIES/REPORTS:21021675::"Denies"}  No Rheumatology ROS completed.   PMFS History:  Patient Active Problem List   Diagnosis Date Noted  . 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  . 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 Chronic gout of foot 11/09/2016  . Age related osteoporosis 11/09/2016  . Hypothyroidism 11/09/2016  . Gastroesophageal reflux 11/09/2016  . Chest pain 01/19/2016  . DM type 2 (diabetes mellitus, type 2) (HCC) 01/19/2016  . Leg cramp 12/12/2014  . Pain, joint, multiple sites 09/08/2012  . Night sweats 06/04/2012  . Eyelid dermatitis, eczematous 04/30/2012  . General medical examination 12/03/2011  . URI 11/14/2010  . FOOT, PAIN 04/19/2010  . SINUSITIS 12/12/2009  . HYPERKALEMIA 11/27/2009  . SKIN LESION 07/27/2009  . LEG CRAMPS, NOCTURNAL 01/26/2009  . DEPRESSIVE DISORDER 10/20/2008  . SCLERODERMA 07/11/2008  . SEBORRHEIC KERATOSIS 03/08/2008  . RHINITIS 12/02/2007  . DIABETES MELLITUS, TYPE II 01/21/2007  . Dyslipidemia 01/21/2007  . Essential hypertension 01/21/2007    Past Medical History:  Diagnosis Date  . Diabetes mellitus   . GERD  (gastroesophageal reflux disease)   . Gout   . Hyperlipidemia   . Hypertension   . Keratosis seborrheica   . Macular degeneration of both eyes   . Osteoporosis   . Raynaud's disease   . Rhinitis   . Scleroderma (HCC)     Family History  Problem Relation Age of Onset  . Stroke Mother   . Heart attack Mother   . Cancer Mother        breast cancer  . Breast cancer Mother [id number]  . Lung cancer Father   . Achalasia Brother   . Arrhythmia Brother   . Arrhythmia Brother   . Cancer Son 20       bladder cancer    Past Surgical History:  Procedure Laterality Date  . BREAST EXCISIONAL BIOPSY Left 2005  . CHOLECYSTECTOMY    . MASTECTOMY PARTIAL / LUMPECTOMY     left breast   . TUBAL LIGATION     Social History   Social History Narrative  . Not on file    Objective: Vital Signs: There were no vitals taken for this visit.   Physical Exam   Musculoskeletal Exam: ***  CDAI Exam: CDAI Score: Not documented Patient Global Assessment: Not documented; Provider Global Assessment: Not documented Swollen: Not documented; Tender: Not documented Joint Exam   Not documented   There is currently no information documented on the homunculus. Go to the Rheumatology activity and complete the homunculus joint exam.  Investigation: No additional findings.  Imaging: No results found.  Recent Labs: Lab Results  Component Value Date   WBC 5.6 02/24/2018   HGB 14.0 02/24/2018   PLT 224 02/24/2018   NA 141 02/24/2018  K 4.5 02/24/2018   CL 106 02/24/2018   CO2 25 02/24/2018   GLUCOSE 224 (H) 02/24/2018   BUN 11 02/24/2018   CREATININE 1.03 (H) 02/24/2018   BILITOT 0.6 02/24/2018   ALKPHOS 54 06/25/2017   AST 23 02/24/2018   ALT 30 (H) 02/24/2018   PROT 7.1 02/24/2018   ALBUMIN 3.8 06/25/2017   CALCIUM 9.7 02/24/2018   GFRAA 63 02/24/2018    Speciality Comments: No specialty comments available.  Procedures:  No procedures performed Allergies: Patient has no known  allergies.   Assessment / Plan:     Visit Diagnoses: No diagnosis found.   Orders: No orders of the defined types were placed in this encounter.  No orders of the defined types were placed in this encounter.   Face-to-face time spent with patient was *** minutes. Greater than 50% of time was spent in counseling and coordination of care.  Follow-Up Instructions: No follow-ups on file.   Ellen HenriMarissa C Moyses Pavey, CMA  Note - This record has been created using Animal nutritionistDragon software.  Chart creation errors have been sought, but may not always  have been located. Such creation errors do not reflect on  the standard of medical care.

## 2019-01-06 NOTE — Progress Notes (Signed)
Office Visit Note  Patient: Andrea Heath             Date of Birth: 02/20/1946           MRN: [id number]             PCP: Clayborn Heron, MD Referring: Clayborn Heron, MD Visit Date: 01/14/2019 Occupation: @GUAROCC @  Subjective:  Raynaud's    History of Present Illness: Andrea Heath is a 73 y.o. female Andrea Heath is a 73 y.o. female with history of a scleroderma, gout and osteoporosis.  She is on allopurinol 300 mg po daily and colchicine 0.6 mg po daily.  She has not had any recent gout flares.  She reports she was taken off of Fosamax and is only taking vitamin D at this time.  She denies any recent falls or fractures.   She reports her Raynaud's have been worsening during the winter months.  She continues to wear gloves daily.  She denies any digital ulcerations.  She has not restarted taking Amlodipine due to needing a refill.  She denies any increased skin tightness or thickness.  She denies any recent rashes.  She denies any dysphagia or constipation.  She denies any shortness of breath. She has been having increased pain in both hands but denies any joint swelling.  She is going to PT for trochanteric bursitis bilaterally which has been helping.    Activities of Daily Living:  Patient reports morning stiffness for 5-10 minutes.   Patient Denies nocturnal pain.  Difficulty dressing/grooming: Denies Difficulty climbing stairs: Denies Difficulty getting out of chair: Denies Difficulty using hands for taps, buttons, cutlery, and/or writing: Denies  Review of Systems  Constitutional: Negative for fatigue.  HENT: Negative for mouth sores, trouble swallowing, trouble swallowing, mouth dryness and nose dryness.   Eyes: Negative for pain, visual disturbance and dryness.  Respiratory: Negative for cough, hemoptysis, shortness of breath and difficulty breathing.   Cardiovascular: Negative for chest pain, palpitations, hypertension and swelling in legs/feet.    Gastrointestinal: Negative for blood in stool, constipation and diarrhea.  Endocrine: Negative for increased urination.  Genitourinary: Negative for painful urination.  Musculoskeletal: Positive for arthralgias, joint pain and morning stiffness. Negative for joint swelling, myalgias, muscle weakness, muscle tenderness and myalgias.  Skin: Positive for color change and skin tightness. Negative for pallor, rash, hair loss, nodules/bumps, ulcers and sensitivity to sunlight.  Allergic/Immunologic: Negative for susceptible to infections.  Neurological: Negative for dizziness, numbness, headaches and weakness.  Hematological: Negative for swollen glands.  Psychiatric/Behavioral: Negative for depressed mood and sleep disturbance. The patient is not nervous/anxious.     PMFS History:  Patient Active Problem List   Diagnosis Date Noted  . 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  . 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 Chronic gout of foot 11/09/2016  . Age related osteoporosis 11/09/2016  . Hypothyroidism 11/09/2016  . Gastroesophageal reflux 11/09/2016  . Chest pain 01/19/2016  . DM type 2 (diabetes mellitus, type 2) (HCC) 01/19/2016  . Leg cramp 12/12/2014  . Pain, joint, multiple sites 09/08/2012  . Night sweats 06/04/2012  . Eyelid dermatitis, eczematous 04/30/2012  . General medical examination 12/03/2011  . URI 11/14/2010  . FOOT, PAIN 04/19/2010  . SINUSITIS 12/12/2009  . HYPERKALEMIA 11/27/2009  . SKIN LESION 07/27/2009  . LEG CRAMPS, NOCTURNAL 01/26/2009  . DEPRESSIVE DISORDER 10/20/2008  . SCLERODERMA 07/11/2008  . SEBORRHEIC KERATOSIS 03/08/2008  . RHINITIS 12/02/2007  . DIABETES MELLITUS, TYPE II 01/21/2007  .  Dyslipidemia 01/21/2007  . Essential hypertension 01/21/2007    Past Medical History:  Diagnosis Date  . Diabetes mellitus   . GERD (gastroesophageal reflux disease)   . Gout   . Hyperlipidemia   . Hypertension   . Keratosis seborrheica   . Macular  degeneration of both eyes   . Osteoporosis   . Raynaud's disease   . Rhinitis   . Scleroderma (HCC)     Family History  Problem Relation Age of Onset  . Stroke Mother   . Heart attack Mother   . Cancer Mother        breast cancer  . Breast cancer Mother 10  . Lung cancer Father   . Achalasia Brother   . Arrhythmia Brother   . Arrhythmia Brother   . Cancer Son 20       bladder cancer    Past Surgical History:  Procedure Laterality Date  . BREAST EXCISIONAL BIOPSY Left 2005  . CHOLECYSTECTOMY    . MASTECTOMY PARTIAL / LUMPECTOMY     left breast   . TUBAL LIGATION     Social History   Social History Narrative  . Not on file    Objective: Vital Signs: BP 136/81 (BP Location: Left Arm, Patient Position: Sitting, Cuff Size: Normal)   Pulse 94   Ht 5\' 5"  (1.651 m)   Wt 174 lb 8 oz (79.2 kg)   BMI 29.04 kg/m    Physical Exam Vitals signs and nursing note reviewed.  Constitutional:      Appearance: She is well-developed.  HENT:     Head: Normocephalic and atraumatic.     Mouth/Throat:     Comments: Small oral aperture  Eyes:     Conjunctiva/sclera: Conjunctivae normal.  Neck:     Musculoskeletal: Normal range of motion.  Cardiovascular:     Rate and Rhythm: Normal rate and regular rhythm.     Heart sounds: Normal heart sounds.  Pulmonary:     Effort: Pulmonary effort is normal.     Breath sounds: Normal breath sounds.  Abdominal:     General: Bowel sounds are normal.     Palpations: Abdomen is soft.  Lymphadenopathy:     Cervical: No cervical adenopathy.  Skin:    General: Skin is warm and dry.     Capillary Refill: Capillary refill takes more than 3 seconds.     Comments: Telangiectasias on hands, neck, and chest Nailbed capillary changes noted. Hands cold to touch. Capillary refill 3 seconds. 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 Digital cyanosis of left 3rd digit.   Neurological:     Mental Status: She is alert  and oriented to person, place, and time.  Psychiatric:        Behavior: Behavior normal.      Musculoskeletal Exam: C-spine good ROM.  Thoracic kyphosis noted.  Good ROM of lumbar spine.  No midline spinal tenderness.  No SI joint tenderness.  Shoulder joints, elbow joints, wrist joints, MCPs, PIPs, and DIPs good ROM with no synovitis.  She has complete fist formation bilaterally.  Hip joints, knee joints, ankle joints, MTPs, PIPs, and DIPs good ROM with no synovitis.  No warmth or effusion of knee joints.  No tenderness or swelling of ankle joints.  Tenderness over trochanteric bursa bilaterally.   CDAI Exam: CDAI Score: Not documented Patient Global Assessment: Not documented; Provider Global Assessment: Not documented Swollen: Not documented; Tender: Not documented Joint Exam   Not documented  There is currently no information documented on the homunculus. Go to the Rheumatology activity and complete the homunculus joint exam.  Investigation: No additional findings.  Imaging: No results found.  Recent Labs: Lab Results  Component Value Date   WBC 5.6 02/24/2018   HGB 14.0 02/24/2018   PLT 224 02/24/2018   NA 141 02/24/2018   K 4.5 02/24/2018   CL 106 02/24/2018   CO2 25 02/24/2018   GLUCOSE 224 (H) 02/24/2018   BUN 11 02/24/2018   CREATININE 1.03 (H) 02/24/2018   BILITOT 0.6 02/24/2018   ALKPHOS 54 06/25/2017   AST 23 02/24/2018   ALT 30 (H) 02/24/2018   PROT 7.1 02/24/2018   ALBUMIN 3.8 06/25/2017   CALCIUM 9.7 02/24/2018   GFRAA 63 02/24/2018    Speciality Comments: Dexa May 2019: T-score -2.1 left femoral neck Dexa July 2016: T-score -2.5 left femoral neck  Procedures:  No procedures performed Allergies: Patient has no known allergies.   Assessment / Plan:     Visit Diagnoses: SCLERODERMA - Limited systemic sclerosis, sclerodactyly, Raynauds, Telengectesia's, positive ANA centromere, positive RF, positive CCP: She has been having worsening symptoms of  Raynaud's.  She has no digital ulcerations or signs of gangrene.  Capillary refill is 3 seconds.  Mild cyanosis of left 3rd digit noted.  Hands were cold to touch. Nailbed capillary changes noted. Telangiectasias on hands, neck, and chest were present. Sclerodactyly to elbows.  She will restart taking Amlodipine 5 mg po daily.  A refill was sent to the pharmacy.  Her blood pressure was 136/81 today in the office.  She was advised to monitor her blood pressure closely.  She has not had any shortness of breath recently.  Lungs were clear to auscultation on exam.  She has no symptoms of dysphagia or constipation recently.  She was advised to notify us if she develops any new or worsening symptoms. CBC, CMP, and UA were checked today.  She will follow up in 5 months.- Plan: COMPLETE METABOLIC PANEL WITH GFR, CBC with Differential/Platelet, Urinalysis, Routine w reflex microscopic  Raynaud's disease without gangrene - No digital ulcerations or signs of gangrene.  Mild cyanosis of left 3rd digit. Capillary refill 3 seconds. She will be restarting on amlodipine 5 mg po daily.  She was advised to notify us if she develops any ulcerations.   Idiopathic chronic gout of left foot without tophus -She has not had any recent gout flares.  She is taking allopurinol 300 mg and colchicine 0.6 mg daily.  We will uric acid level today.  Refills of allopurinol and colchicine will be sent to the pharmacy. - Plan: Uric acid  Hyperuricemia - We will check uric acid level today. Plan: Uric acid  Age-related osteoporosis without current pathological fracture - DEXA  04/30/2018 BMD: 0.750, T-score: -2.1. July 2016 T score -2.5 left femoral neck.  She discontinued Fosamax 70 mg weekly per recommendations of her PCP according to the patient.  She is taking a vitamin D supplement.    Trochanteric bursitis of both hips: She has tenderness over bilateral trochanteric bursa.  She has been going to PT which has been improving her symptoms.     Postural kyphosis of thoracic region: She has thoracic kyphosis.  No midline spinal tenderness.    History of diabetes mellitus, type II  Seborrheic keratosis  History of hypertension   Orders: Orders Placed This Encounter  Procedures  . COMPLETE METABOLIC PANEL WITH GFR  . CBC with Differential/Platelet  . Uric  acid  . Urinalysis, Routine w reflex microscopic   Meds ordered this encounter  Medications  . amLODipine (NORVASC) 5 MG tablet    Sig: Take 1 tablet (5 mg total) by mouth daily.    Dispense:  90 tablet    Refill:  1    Face-to-face time spent with patient was 30 minutes. Greater than 50% of time was spent in counseling and coordination of care.  Follow-Up Instructions: Return in about 5 months (around 06/15/2019) for Scleroderma, Raynaud's syndrome, Gout, Osteoporosis.   Gearldine Bienenstockaylor M Faatima Tench, PA-C   I examined and evaluated the patient with Sherron Alesaylor Marcey Persad PA.  Patient has been experiencing increased issues with 3 notes with the weather change.  We will call in prescription for amlodipine.  There is no change in her sclerodactyly.  She has not had any gout flares.  The plan of care was discussed as noted above.  Pollyann SavoyShaili Deveshwar, MD  Note - This record has been created using Animal nutritionistDragon software.  Chart creation errors have been sought, but may not always  have been located. Such creation errors do not reflect on  the standard of medical care.

## 2019-01-07 DIAGNOSIS — R69 Illness, unspecified: Secondary | ICD-10-CM | POA: Diagnosis not present

## 2019-01-12 ENCOUNTER — Ambulatory Visit: Payer: Medicare HMO | Admitting: Rheumatology

## 2019-01-12 DIAGNOSIS — H524 Presbyopia: Secondary | ICD-10-CM | POA: Diagnosis not present

## 2019-01-12 DIAGNOSIS — H35363 Drusen (degenerative) of macula, bilateral: Secondary | ICD-10-CM | POA: Diagnosis not present

## 2019-01-13 DIAGNOSIS — Z0100 Encounter for examination of eyes and vision without abnormal findings: Secondary | ICD-10-CM | POA: Diagnosis not present

## 2019-01-14 ENCOUNTER — Telehealth: Payer: Self-pay | Admitting: Pharmacist

## 2019-01-14 ENCOUNTER — Telehealth: Payer: Self-pay | Admitting: Rheumatology

## 2019-01-14 ENCOUNTER — Ambulatory Visit: Payer: Medicare HMO | Admitting: Rheumatology

## 2019-01-14 ENCOUNTER — Encounter: Payer: Self-pay | Admitting: Rheumatology

## 2019-01-14 ENCOUNTER — Other Ambulatory Visit: Payer: Self-pay | Admitting: Neurology

## 2019-01-14 VITALS — BP 136/81 | HR 94 | Ht 65.0 in | Wt 174.5 lb

## 2019-01-14 DIAGNOSIS — I7300 Raynaud's syndrome without gangrene: Secondary | ICD-10-CM

## 2019-01-14 DIAGNOSIS — M1A0720 Idiopathic chronic gout, left ankle and foot, without tophus (tophi): Secondary | ICD-10-CM

## 2019-01-14 DIAGNOSIS — M349 Systemic sclerosis, unspecified: Secondary | ICD-10-CM

## 2019-01-14 DIAGNOSIS — M810 Age-related osteoporosis without current pathological fracture: Secondary | ICD-10-CM | POA: Diagnosis not present

## 2019-01-14 DIAGNOSIS — M7061 Trochanteric bursitis, right hip: Secondary | ICD-10-CM | POA: Diagnosis not present

## 2019-01-14 DIAGNOSIS — M7062 Trochanteric bursitis, left hip: Secondary | ICD-10-CM

## 2019-01-14 DIAGNOSIS — E790 Hyperuricemia without signs of inflammatory arthritis and tophaceous disease: Secondary | ICD-10-CM | POA: Diagnosis not present

## 2019-01-14 DIAGNOSIS — M4004 Postural kyphosis, thoracic region: Secondary | ICD-10-CM

## 2019-01-14 DIAGNOSIS — L821 Other seborrheic keratosis: Secondary | ICD-10-CM | POA: Diagnosis not present

## 2019-01-14 DIAGNOSIS — Z8679 Personal history of other diseases of the circulatory system: Secondary | ICD-10-CM

## 2019-01-14 DIAGNOSIS — Z8639 Personal history of other endocrine, nutritional and metabolic disease: Secondary | ICD-10-CM | POA: Diagnosis not present

## 2019-01-14 MED ORDER — AMLODIPINE BESYLATE 5 MG PO TABS: 5.0000 mg | ORAL_TABLET | Freq: Every day | ORAL | 1 refills | Status: DC

## 2019-01-14 NOTE — Telephone Encounter (Signed)
Patient seen in office requesting refill for Colcrys.  She receives through [employer] patient assistance.  Her application expired on 12/29/2018 and is due for renewal.  Patient was given renewal application to fill out and return with income documents.  She currently still has medication on hand.  She is to call our office if she runs out of medication and we can provide samples of Mitigare, another brand name of colchicine.

## 2019-01-14 NOTE — Telephone Encounter (Signed)
Submitted Patient Assistance Application to [facility] for Colcrys. Will follow up to confirm receipt.   Will send documents to scan center.  Fax# [phone] Phone# [phone]

## 2019-01-14 NOTE — Telephone Encounter (Signed)
Patient left a voicemail stating she left the office today with forms to fill out and doesn't know what information is needed.  Patient requested a return call.

## 2019-01-14 NOTE — Telephone Encounter (Signed)
Spoke with patient earlier today to clarify documents needed for patient assistance.  She brought them by this afternoon.

## 2019-01-15 ENCOUNTER — Telehealth: Payer: Self-pay | Admitting: Rheumatology

## 2019-01-15 LAB — URINALYSIS, ROUTINE W REFLEX MICROSCOPIC
Bacteria, UA: NONE SEEN /HPF
Bilirubin Urine: NEGATIVE
Glucose, UA: NEGATIVE
Hyaline Cast: NONE SEEN /LPF
Ketones, ur: NEGATIVE
Nitrite: NEGATIVE
Protein, ur: NEGATIVE
SPECIFIC GRAVITY, URINE: 1.011 (ref 1.001–1.03)
pH: <=5 (ref 5.0–8.0)

## 2019-01-15 LAB — COMPLETE METABOLIC PANEL WITH GFR
AG Ratio: 1.5 (calc) (ref 1.0–2.5)
ALT: 29 U/L (ref 6–29)
AST: 26 U/L (ref 10–35)
Albumin: 4 g/dL (ref 3.6–5.1)
Alkaline phosphatase (APISO): 79 U/L (ref 33–130)
BILIRUBIN TOTAL: 0.5 mg/dL (ref 0.2–1.2)
BUN/Creatinine Ratio: 14 (calc) (ref 6–22)
BUN: 13 mg/dL (ref 7–25)
CHLORIDE: 106 mmol/L (ref 98–110)
CO2: 26 mmol/L (ref 20–32)
Calcium: 9.2 mg/dL (ref 8.6–10.4)
Creat: 0.94 mg/dL — ABNORMAL HIGH (ref 0.60–0.93)
GFR, Est African American: 70 mL/min/{1.73_m2} (ref >=60)
GFR, Est Non African American: 60 mL/min/{1.73_m2} (ref >=60)
Globulin: 2.6 g/dL (calc) (ref 1.9–3.7)
Glucose, Bld: 184 mg/dL — ABNORMAL HIGH (ref 65–99)
Potassium: 4.4 mmol/L (ref 3.5–5.3)
SODIUM: 139 mmol/L (ref 135–146)
Total Protein: 6.6 g/dL (ref 6.1–8.1)

## 2019-01-15 LAB — CBC WITH DIFFERENTIAL/PLATELET
Absolute Monocytes: 432 cells/uL (ref 200–950)
BASOS PCT: 0.4 %
Basophils Absolute: 21 cells/uL (ref 0–200)
EOS PCT: 1.4 %
Eosinophils Absolute: 73 cells/uL (ref 15–500)
HCT: 39.4 % (ref 35.0–45.0)
Hemoglobin: 13.2 g/dL (ref 11.7–15.5)
Lymphs Abs: 1799 cells/uL (ref 850–3900)
MCH: 29.9 pg (ref 27.0–33.0)
MCHC: 33.5 g/dL (ref 32.0–36.0)
MCV: 89.3 fL (ref 80.0–100.0)
MPV: 13 fL — ABNORMAL HIGH (ref 7.5–12.5)
Monocytes Relative: 8.3 %
Neutro Abs: 2876 cells/uL (ref 1500–7800)
Neutrophils Relative %: 55.3 %
Platelets: 232 10*3/uL (ref 140–400)
RBC: 4.41 10*6/uL (ref 3.80–5.10)
RDW: 13 % (ref 11.0–15.0)
Total Lymphocyte: 34.6 %
WBC: 5.2 10*3/uL (ref 3.8–10.8)

## 2019-01-15 LAB — URIC ACID: Uric Acid, Serum: 4.3 mg/dL (ref 2.5–7.0)

## 2019-01-15 NOTE — Telephone Encounter (Signed)
Patient left a message at [facility] 10:55am, returning your call.

## 2019-01-15 NOTE — Progress Notes (Signed)
Glucose is elevated-184.  Creatinine is borderline elevated but trending down.  GFR is WNL.  CBC WNL. Uric acid is within desirable range.  UA revealed 2+ hemoglobin and 10-20 RBCs.  Please notify patient and advise patient to follow up with PCP to repeat UA.  She may require further workup.

## 2019-01-15 NOTE — Telephone Encounter (Signed)
Fax keeps failing to go through, mailed application. Will continue to try to fax.

## 2019-01-15 NOTE — Telephone Encounter (Signed)
Spoke with patient and advised of lab results

## 2019-01-18 NOTE — Telephone Encounter (Signed)
Received fax from [place], Patient's application has been APPROVED through 01/19/2020  Will send document to scan center.  12:55 PM Dorthula Nettles, CPhT

## 2019-01-19 DIAGNOSIS — E039 Hypothyroidism, unspecified: Secondary | ICD-10-CM | POA: Diagnosis not present

## 2019-01-19 DIAGNOSIS — Z0000 Encounter for general adult medical examination without abnormal findings: Secondary | ICD-10-CM | POA: Diagnosis not present

## 2019-01-19 DIAGNOSIS — Z8739 Personal history of other diseases of the musculoskeletal system and connective tissue: Secondary | ICD-10-CM | POA: Diagnosis not present

## 2019-01-19 DIAGNOSIS — Z7984 Long term (current) use of oral hypoglycemic drugs: Secondary | ICD-10-CM | POA: Diagnosis not present

## 2019-01-19 DIAGNOSIS — Z1159 Encounter for screening for other viral diseases: Secondary | ICD-10-CM | POA: Diagnosis not present

## 2019-01-19 DIAGNOSIS — I7300 Raynaud's syndrome without gangrene: Secondary | ICD-10-CM | POA: Diagnosis not present

## 2019-01-19 DIAGNOSIS — I10 Essential (primary) hypertension: Secondary | ICD-10-CM | POA: Diagnosis not present

## 2019-01-19 DIAGNOSIS — E1169 Type 2 diabetes mellitus with other specified complication: Secondary | ICD-10-CM | POA: Diagnosis not present

## 2019-01-19 DIAGNOSIS — E7800 Pure hypercholesterolemia, unspecified: Secondary | ICD-10-CM | POA: Diagnosis not present

## 2019-01-21 ENCOUNTER — Other Ambulatory Visit: Payer: Self-pay | Admitting: Neurology

## 2019-02-02 ENCOUNTER — Other Ambulatory Visit: Payer: Self-pay | Admitting: Neurology

## 2019-02-18 ENCOUNTER — Other Ambulatory Visit: Payer: Self-pay | Admitting: Neurology

## 2019-05-18 DIAGNOSIS — M1812 Unilateral primary osteoarthritis of first carpometacarpal joint, left hand: Secondary | ICD-10-CM | POA: Insufficient documentation

## 2019-05-18 DIAGNOSIS — M1811 Unilateral primary osteoarthritis of first carpometacarpal joint, right hand: Secondary | ICD-10-CM | POA: Diagnosis not present

## 2019-05-18 DIAGNOSIS — M180 Bilateral primary osteoarthritis of first carpometacarpal joints: Secondary | ICD-10-CM | POA: Diagnosis not present

## 2019-05-18 DIAGNOSIS — M79645 Pain in left finger(s): Secondary | ICD-10-CM | POA: Diagnosis not present

## 2019-05-18 HISTORY — DX: Unilateral primary osteoarthritis of first carpometacarpal joint, right hand: M18.11

## 2019-05-28 DIAGNOSIS — M1811 Unilateral primary osteoarthritis of first carpometacarpal joint, right hand: Secondary | ICD-10-CM | POA: Diagnosis not present

## 2019-05-28 DIAGNOSIS — M1812 Unilateral primary osteoarthritis of first carpometacarpal joint, left hand: Secondary | ICD-10-CM | POA: Diagnosis not present

## 2019-06-07 ENCOUNTER — Other Ambulatory Visit: Payer: Self-pay | Admitting: Family Medicine

## 2019-06-07 DIAGNOSIS — Z1231 Encounter for screening mammogram for malignant neoplasm of breast: Secondary | ICD-10-CM

## 2019-06-08 NOTE — Progress Notes (Signed)
Office Visit Note  Patient: Andrea EndsBrenda W Viets             Date of Birth: 05/11/1946           MRN: [id number]             PCP: Clayborn Heronankins, [name], MD Referring: Clayborn Heronankins, [name], MD Visit Date: 06/22/2019 Occupation: @GUAROCC @  Subjective:  Pain in both hands   History of Present Illness: Andrea Heath is a 73 y.o. female with history of scleroderma, gout, and osteoporosis.  She is taking allopurinol 300 mg 1 tablet by mouth daily colchicine 0.6 mg 1 tablet by mouth daily as needed during a flare.  She denies any recent gout flares.  She has intermittent symptoms of Raynaud's but denies any digital ulcerations or signs of gangrene. She holds Norvasc 5 mg by mouth daily in the summer.  She wears gloves intermittently as needed.  She denies any increased skin tightness, thickness, rashes, or photosensitivity.  She denies any dysphagia. She continues taking aspirin 81 mg po daily. She is having increased pain in both hands. She uses aspercreme topically prn for pain relief. She denies any joint swelling.  She has no over joint pain at this time.  She states that after the cortisone injection on 06/02/18 the left trochanteric bursitis resolved.     Activities of Daily Living:  Patient reports morning stiffness for 0 minutes.   Patient Denies nocturnal pain.  Difficulty dressing/grooming: Denies Difficulty climbing stairs: Denies Difficulty getting out of chair: Denies Difficulty using hands for taps, buttons, cutlery, and/or writing: Reports  Review of Systems  Constitutional: Negative for fatigue.  HENT: Negative for mouth sores, mouth dryness and nose dryness.   Eyes: Negative for pain, itching, visual disturbance and dryness.  Respiratory: Negative for cough, hemoptysis, shortness of breath, wheezing and difficulty breathing.   Cardiovascular: Negative for chest pain, palpitations, hypertension and swelling in legs/feet.  Gastrointestinal: Negative for abdominal pain, blood in  stool, constipation and diarrhea.  Endocrine: Negative for increased urination.  Genitourinary: Negative for painful urination and pelvic pain.  Musculoskeletal: Positive for arthralgias and joint pain. Negative for joint swelling, myalgias, muscle weakness, morning stiffness, muscle tenderness and myalgias.  Skin: Negative for color change, pallor, rash, hair loss, nodules/bumps, redness, skin tightness, ulcers and sensitivity to sunlight.  Allergic/Immunologic: Negative for susceptible to infections.  Neurological: Negative for dizziness, numbness, headaches, memory loss and weakness.  Hematological: Negative for bruising/bleeding tendency and swollen glands.  Psychiatric/Behavioral: Negative for depressed mood, confusion and sleep disturbance. The patient is not nervous/anxious.     PMFS History:  Patient Active Problem List   Diagnosis Date Noted  . 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  . 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 Chronic gout of foot 11/09/2016  . Age related osteoporosis 11/09/2016  . Hypothyroidism 11/09/2016  . Gastroesophageal reflux 11/09/2016  . Chest pain 01/19/2016  . DM type 2 (diabetes mellitus, type 2) (HCC) 01/19/2016  . Leg cramp 12/12/2014  . Pain, joint, multiple sites 09/08/2012  . Night sweats 06/04/2012  . Eyelid dermatitis, eczematous 04/30/2012  . General medical examination 12/03/2011  . URI 11/14/2010  . FOOT, PAIN 04/19/2010  . SINUSITIS 12/12/2009  . HYPERKALEMIA 11/27/2009  . SKIN LESION 07/27/2009  . LEG CRAMPS, NOCTURNAL 01/26/2009  . DEPRESSIVE DISORDER 10/20/2008  . SCLERODERMA 07/11/2008  . SEBORRHEIC KERATOSIS 03/08/2008  . RHINITIS 12/02/2007  . DIABETES MELLITUS, TYPE II 01/21/2007  . Dyslipidemia 01/21/2007  . Essential hypertension 01/21/2007  Past Medical History:  Diagnosis Date  . Diabetes mellitus   . GERD (gastroesophageal reflux disease)   . Gout   . Hyperlipidemia   . Hypertension   . Keratosis seborrheica    . Macular degeneration of both eyes   . Osteoporosis   . Raynaud's disease   . Rhinitis   . Scleroderma (HCC)     Family History  Problem Relation Age of Onset  . Stroke Mother   . Heart attack Mother   . Cancer Mother        breast cancer  . Breast cancer Mother [id number]  . Lung cancer Father   . Achalasia Brother   . Arrhythmia Brother   . Arrhythmia Brother   . Cancer Son 20       bladder cancer    Past Surgical History:  Procedure Laterality Date  . BREAST EXCISIONAL BIOPSY Left 2005  . CHOLECYSTECTOMY    . MASTECTOMY PARTIAL / LUMPECTOMY     left breast   . TUBAL LIGATION     Social History   Social History Narrative  . Not on file   Immunization History  Administered Date(s) Administered  . Influenza Split 10/10/2011, 09/08/2012  . Influenza Whole 10/27/2007, 09/22/2008, 09/28/2009, 08/29/2010  . Influenza,inj,Quad PF,6+ Mos 09/21/2013, 09/27/2014  . Pneumococcal Polysaccharide-23 11/29/2010  . Td 06/24/2002, 12/13/2013  . Zoster 07/11/2008     Objective: Vital Signs: BP 129/78 (BP Location: Left Arm, Patient Position: Sitting, Cuff Size: Normal)   Pulse (!) 102   Resp 14   Ht 5\' 7"  (1.702 m)   Wt 167 lb 3.2 oz (75.8 kg)   BMI 26.19 kg/m    Physical Exam Vitals signs and nursing note reviewed.  Constitutional:      Appearance: She is well-developed.  HENT:     Head: Normocephalic and atraumatic.  Eyes:     Conjunctiva/sclera: Conjunctivae normal.  Neck:     Musculoskeletal: Normal range of motion.  Cardiovascular:     Rate and Rhythm: Normal rate and regular rhythm.     Heart sounds: Normal heart sounds.  Pulmonary:     Effort: Pulmonary effort is normal.     Breath sounds: Normal breath sounds.  Abdominal:     General: Bowel sounds are normal.     Palpations: Abdomen is soft.  Lymphadenopathy:     Cervical: No cervical adenopathy.  Skin:    General: Skin is warm and dry.     Capillary Refill: Capillary refill takes 2 to 3 seconds.      Comments: Skin thickening distal to MCP joints.  Few telangiectasias on palmar surface of both hands.  Varicose veins on lower extremities noted.  Neurological:     Mental Status: She is alert and oriented to person, place, and time.  Psychiatric:        Behavior: Behavior normal.      Musculoskeletal Exam: C-spine good ROM. Thoracic kyphosis noted. Limited ROM of left shoulder joint. Wrist joints, MCPs, PIPs, and DIPs good ROM with no synovitis.   Bilateral CMC joint synovial thickening.  Hip joints, knee joints, ankle joints, MTPs, PIPs, and DIPs good ROM with no synovitis.  No warmth or effusion of knee joints.  No tenderness or swelling of ankle joints.  No tenderness over trochanteric bursa bilaterally.    CDAI Exam: CDAI Score: - Patient Global: -; Provider Global: - Swollen: -; Tender: - Joint Exam   No joint exam has been documented for this visit   There  is currently no information documented on the homunculus. Go to the Rheumatology activity and complete the homunculus joint exam.  Investigation: No additional findings.  Imaging: No results found.  Recent Labs: Lab Results  Component Value Date   WBC 5.2 01/14/2019   HGB 13.2 01/14/2019   PLT 232 01/14/2019   NA 139 01/14/2019   K 4.4 01/14/2019   CL 106 01/14/2019   CO2 26 01/14/2019   GLUCOSE 184 (H) 01/14/2019   BUN 13 01/14/2019   CREATININE 0.94 (H) 01/14/2019   BILITOT 0.5 01/14/2019   ALKPHOS 54 06/25/2017   AST 26 01/14/2019   ALT 29 01/14/2019   PROT 6.6 01/14/2019   ALBUMIN 3.8 06/25/2017   CALCIUM 9.2 01/14/2019   GFRAA 70 01/14/2019    Speciality Comments: Dexa May 2019: T-score -2.1 left femoral neck Dexa July 2016: T-score -2.5 left femoral neck  Procedures:  No procedures performed Allergies: Patient has no known allergies.   Assessment / Plan:     Visit Diagnoses: SCLERODERMA - Limited systemic sclerosis, sclerodactyly, Raynauds, Telengectesia's, positive ANA centromere, positive RF,  positive CCP -she is clinically doing well at this time.  Her symptoms of Raynaud's have improved with warmer weather.  She is no longer taking Norvasc 5 mg by mouth daily.  She continues to take aspirin 81 mg by mouth daily.  She has mild nail capillary bed changes and telangiectasias on the palmar surface of both hands.  Capillary refill is between 2 to 3 seconds.  No cyanosis, ulcerations or signs of gangrene were noted.  Sclerodactyly is stable.  She has not experienced any new or worsening GI or pulmonary symptoms.  We will check a CBC, CMP, and UA today.  She is advised to notify us if develops any new or worsening symptoms.  She will follow-up in the office in 6 months.  Plan: Urinalysis, Routine w reflex microscopic  Raynaud's disease without gangrene - She has intermittent symptoms of Raynaud's.  She has no digital ulcerations or signs of gangrene.  She only takes Norvasc 5 mg po daily in the wintertime, so she is holding it currently.  She continues to wear gloves on a regular basis.  She was encouraged to notify us if she develops new or worsening symptoms.   Idiopathic chronic gout of left foot without tophus -She has not had any recent gout flares.  She is clinically doing well on allopurinol 300 mg and colchicine 0.6 mg daily prn.  Her uric acid level on 01/14/2019 was 4.3.  We will check CBC, CMP, and uric acid level today.  She was advised to notify us if she develops signs or symptoms of a gout flare.  She does not need any refill of allopurinol or colchicine at this time. - Plan: COMPLETE METABOLIC PANEL WITH GFR, CBC with Differential/Platelet, Uric acid  Hyperuricemia -We will check uric acid level today. Plan: Uric acid  Age-related osteoporosis without current pathological fracture - DEXA  04/30/2018 BMD: 0.750, T-score: -2.1. July 2016 T score -2.5 left femoral neck.  She discontinued Fosamax 70 mg weekly per recommendations of her PCP according to the patient.   Trochanteric bursitis  of both hips -Resolved.  She had a left trochanter bursa cortisone injection on 06/02/2018 that resolved her symptoms.  Postural kyphosis of thoracic region -Thoracic kyphosis noted.  She was encouraged to work on posture.  PCP continues to check DEXA  She was given a handout of back exercises.  Other medical conditions are listed as  follows:   History of hypertension   History of diabetes mellitus, type II   Seborrheic keratosis   Orders: Orders Placed This Encounter  Procedures  . COMPLETE METABOLIC PANEL WITH GFR  . CBC with Differential/Platelet  . Urinalysis, Routine w reflex microscopic  . Uric acid   No orders of the defined types were placed in this encounter.   Face-to-face time spent with patient was 30 minutes. Greater than 50% of time was spent in counseling and coordination of care.  Follow-Up Instructions: Return in about 6 months (around 12/22/2019) for Scleroderma, Gout.   Ofilia Neas, PA-C   I examined and evaluated the patient with Hazel Sams PA.  Sclerodactyly.  She has not had any gout flare since the last visit.  We will check labs today.  The plan of care was discussed as noted above.  Bo Merino, MD  Note - This record has been created using Editor, commissioning.  Chart creation errors have been sought, but may not always  have been located. Such creation errors do not reflect on  the standard of medical care.

## 2019-06-22 ENCOUNTER — Ambulatory Visit (INDEPENDENT_AMBULATORY_CARE_PROVIDER_SITE_OTHER): Payer: Medicare HMO | Admitting: Rheumatology

## 2019-06-22 ENCOUNTER — Other Ambulatory Visit: Payer: Self-pay

## 2019-06-22 ENCOUNTER — Encounter: Payer: Self-pay | Admitting: Rheumatology

## 2019-06-22 VITALS — BP 129/78 | HR 102 | Resp 14 | Ht 67.0 in | Wt 167.2 lb

## 2019-06-22 DIAGNOSIS — M1A0720 Idiopathic chronic gout, left ankle and foot, without tophus (tophi): Secondary | ICD-10-CM

## 2019-06-22 DIAGNOSIS — M7062 Trochanteric bursitis, left hip: Secondary | ICD-10-CM

## 2019-06-22 DIAGNOSIS — I7300 Raynaud's syndrome without gangrene: Secondary | ICD-10-CM

## 2019-06-22 DIAGNOSIS — E790 Hyperuricemia without signs of inflammatory arthritis and tophaceous disease: Secondary | ICD-10-CM | POA: Diagnosis not present

## 2019-06-22 DIAGNOSIS — M7061 Trochanteric bursitis, right hip: Secondary | ICD-10-CM | POA: Diagnosis not present

## 2019-06-22 DIAGNOSIS — L821 Other seborrheic keratosis: Secondary | ICD-10-CM | POA: Diagnosis not present

## 2019-06-22 DIAGNOSIS — M4004 Postural kyphosis, thoracic region: Secondary | ICD-10-CM

## 2019-06-22 DIAGNOSIS — M810 Age-related osteoporosis without current pathological fracture: Secondary | ICD-10-CM | POA: Diagnosis not present

## 2019-06-22 DIAGNOSIS — M349 Systemic sclerosis, unspecified: Secondary | ICD-10-CM

## 2019-06-22 DIAGNOSIS — Z8639 Personal history of other endocrine, nutritional and metabolic disease: Secondary | ICD-10-CM | POA: Diagnosis not present

## 2019-06-22 DIAGNOSIS — Z8679 Personal history of other diseases of the circulatory system: Secondary | ICD-10-CM

## 2019-06-22 NOTE — Patient Instructions (Addendum)
Shoulder Exercises  Ask your health care provider which exercises are safe for you. Do exercises exactly as told by your health care provider and adjust them as directed. It is normal to feel mild stretching, pulling, tightness, or discomfort as you do these exercises, but you should stop right away if you feel sudden pain or your pain gets worse. Do not begin these exercises until told by your health care provider.  Range of Motion Exercises              These exercises warm up your muscles and joints and improve the movement and flexibility of your shoulder. These exercises also help to relieve pain, numbness, and tingling. These exercises involve stretching your injured shoulder directly.  Exercise A: Pendulum  1. Stand near a wall or a surface that you can hold onto for balance.  2. Bend at the waist and let your left / right arm hang straight down. Use your other arm to support you. Keep your back straight and do not lock your knees.  3. Relax your left / right arm and shoulder muscles, and move your hips and your trunk so your left / right arm swings freely. Your arm should swing because of the motion of your body, not because you are using your arm or shoulder muscles.  4. Keep moving your body so your arm swings in the following directions, as told by your health care provider:  ? Side to side.  ? Forward and backward.  ? In clockwise and counterclockwise circles.  5. Continue each motion for __________ seconds, or for as long as told by your health care provider.  6. Slowly return to the starting position.  Repeat __________ times. Complete this exercise __________ times a day.  Exercise B: Flexion, Standing  1. Stand and hold a broomstick, a cane, or a similar object. Place your hands a little more than shoulder-width apart on the object. Your left / right hand should be palm-up, and your other hand should be palm-down.  2. Keep your elbow straight and keep your shoulder muscles relaxed. Push the stick  down with your healthy arm to raise your left / right arm in front of your body, and then over your head until you feel a stretch in your shoulder.  ? Avoid shrugging your shoulder while you raise your arm. Keep your shoulder blade tucked down toward the middle of your back.  3. Hold for __________ seconds.  4. Slowly return to the starting position.  Repeat __________ times. Complete this exercise __________ times a day.  Exercise C: Abduction, Standing  1. Stand and hold a broomstick, a cane, or a similar object. Place your hands a little more than shoulder-width apart on the object. Your left / right hand should be palm-up, and your other hand should be palm-down.  2. While keeping your elbow straight and your shoulder muscles relaxed, push the stick across your body toward your left / right side. Raise your left / right arm to the side of your body and then over your head until you feel a stretch in your shoulder.  ? Do not raise your arm above shoulder height, unless your health care provider tells you to do that.  ? Avoid shrugging your shoulder while you raise your arm. Keep your shoulder blade tucked down toward the middle of your back.  3. Hold for __________ seconds.  4. Slowly return to the starting position.  Repeat __________ times. Complete this exercise __________ times a   day.  Exercise D: Internal Rotation  1. Place your left / right hand behind your back, palm-up.  2. Use your other hand to dangle an exercise band, a towel, or a similar object over your shoulder. Grasp the band with your left / right hand so you are holding onto both ends.  3. Gently pull up on the band until you feel a stretch in the front of your left / right shoulder.  ? Avoid shrugging your shoulder while you raise your arm. Keep your shoulder blade tucked down toward the middle of your back.  4. Hold for __________ seconds.  5. Release the stretch by letting go of the band and lowering your hands.  Repeat __________ times.  Complete this exercise __________ times a day.  Stretching Exercises    These exercises warm up your muscles and joints and improve the movement and flexibility of your shoulder. These exercises also help to relieve pain, numbness, and tingling. These exercises are done using your healthy shoulder to help stretch the muscles of your injured shoulder.  Exercise E: Corner Stretch (External Rotation and Abduction)  1. Stand in a doorway with one of your feet slightly in front of the other. This is called a staggered stance. If you cannot reach your forearms to the door frame, stand facing a corner of a room.  2. Choose one of the following positions as told by your health care provider:  ? Place your hands and forearms on the door frame above your head.  ? Place your hands and forearms on the door frame at the height of your head.  ? Place your hands on the door frame at the height of your elbows.  3. Slowly move your weight onto your front foot until you feel a stretch across your chest and in the front of your shoulders. Keep your head and chest upright and keep your abdominal muscles tight.  4. Hold for __________ seconds.  5. To release the stretch, shift your weight to your back foot.  Repeat __________ times. Complete this stretch __________ times a day.  Exercise F: Extension, Standing  1. Stand and hold a broomstick, a cane, or a similar object behind your back.  ? Your hands should be a little wider than shoulder-width apart.  ? Your palms should face away from your back.  2. Keeping your elbows straight and keeping your shoulder muscles relaxed, move the stick away from your body until you feel a stretch in your shoulder.  ? Avoid shrugging your shoulders while you move the stick. Keep your shoulder blade tucked down toward the middle of your back.  3. Hold for __________ seconds.  4. Slowly return to the starting position.  Repeat __________ times. Complete this exercise __________ times a  day.  Strengthening Exercises                   These exercises build strength and endurance in your shoulder. Endurance is the ability to use your muscles for a long time, even after they get tired.  Exercise G: External Rotation  1. Sit in a stable chair without armrests.  2. Secure an exercise band at elbow height on your left / right side.  3. Place a soft object, such as a folded towel or a small pillow, between your left / right upper arm and your body to move your elbow a few inches away (about 10 cm) from your side.  4. Hold the end of the band so it   is tight and there is no slack.  5. Keeping your elbow pressed against the soft object, move your left / right forearm out, away from your abdomen. Keep your body steady so only your forearm moves.  6. Hold for __________ seconds.  7. Slowly return to the starting position.  Repeat __________ times. Complete this exercise __________ times a day.  Exercise H: Shoulder Abduction  1. Sit in a stable chair without armrests, or stand.  2. Hold a __________ weight in your left / right hand, or hold an exercise band with both hands.  3. Start with your arms straight down and your left / right palm facing in, toward your body.  4. Slowly lift your left / right hand out to your side. Do not lift your hand above shoulder height unless your health care provider tells you that this is safe.  ? Keep your arms straight.  ? Avoid shrugging your shoulder while you do this movement. Keep your shoulder blade tucked down toward the middle of your back.  5. Hold for __________ seconds.  6. Slowly lower your arm, and return to the starting position.  Repeat __________ times. Complete this exercise __________ times a day.  Exercise I: Shoulder Extension  1. Sit in a stable chair without armrests, or stand.  2. Secure an exercise band to a stable object in front of you where it is at shoulder height.  3. Hold one end of the exercise band in each hand. Your palms should face each  other.  4. Straighten your elbows and lift your hands up to shoulder height.  5. Step back, away from the secured end of the exercise band, until the band is tight and there is no slack.  6. Squeeze your shoulder blades together as you pull your hands down to the sides of your thighs. Stop when your hands are straight down by your sides. Do not let your hands go behind your body.  7. Hold for __________ seconds.  8. Slowly return to the starting position.  Repeat __________ times. Complete this exercise __________ times a day.  Exercise J: Standing Shoulder Row  1. Sit in a stable chair without armrests, or stand.  2. Secure an exercise band to a stable object in front of you so it is at waist height.  3. Hold one end of the exercise band in each hand. Your palms should be in a thumbs-up position.  4. Bend each of your elbows to an "L" shape (about 90 degrees) and keep your upper arms at your sides.  5. Step back until the band is tight and there is no slack.  6. Slowly pull your elbows back behind you.  7. Hold for __________ seconds.  8. Slowly return to the starting position.  Repeat __________ times. Complete this exercise __________ times a day.  Exercise K: Shoulder Press-Ups  1. Sit in a stable chair that has armrests. Sit upright, with your feet flat on the floor.  2. Put your hands on the armrests so your elbows are bent and your fingers are pointing forward. Your hands should be about even with the sides of your body.  3. Push down on the armrests and use your arms to lift yourself off of the chair. Straighten your elbows and lift yourself up as much as you comfortably can.  ? Move your shoulder blades down, and avoid letting your shoulders move up toward your ears.  ? Keep your feet on the ground. As you get stronger, your   feet should support less of your body weight as you lift yourself up.  4. Hold for __________ seconds.  5. Slowly lower yourself back into the chair.  Repeat __________ times. Complete  this exercise __________ times a day.  Exercise L: Wall Push-Ups  1. Stand so you are facing a stable wall. Your feet should be about one arm-length away from the wall.  2. Lean forward and place your palms on the wall at shoulder height.  3. Keep your feet flat on the floor as you bend your elbows and lean forward toward the wall.  4. Hold for __________ seconds.  5. Straighten your elbows to push yourself back to the starting position.  Repeat __________ times. Complete this exercise __________ times a day.  This information is not intended to replace advice given to you by your health care provider. Make sure you discuss any questions you have with your health care provider.  Document Released: 10/30/2005 Document Revised: 04/21/2018 Document Reviewed: 08/27/2015  Elsevier Interactive Patient Education © 2019 [employer]      Back Exercises  If you have pain in your back, do these exercises 2-3 times each day or as told by your doctor. When the pain goes away, do the exercises once each day, but repeat the steps more times for each exercise (do more repetitions). If you do not have pain in your back, do these exercises once each day or as told by your doctor.  Exercises  Single Knee to Chest  Do these steps 3-5 times in a row for each leg:  1. Lie on your back on a firm bed or the floor with your legs stretched out.  2. Bring one knee to your chest.  3. Hold your knee to your chest by grabbing your knee or thigh.  4. Pull on your knee until you feel a gentle stretch in your lower back.  5. Keep doing the stretch for 10-30 seconds.  6. Slowly let go of your leg and straighten it.  Pelvic Tilt  Do these steps 5-10 times in a row:  1. Lie on your back on a firm bed or the floor with your legs stretched out.  2. Bend your knees so they point up to the ceiling. Your feet should be flat on the floor.  3. Tighten your lower belly (abdomen) muscles to press your lower back against the floor. This will make your  tailbone point up to the ceiling instead of pointing down to your feet or the floor.  4. Stay in this position for 5-10 seconds while you gently tighten your muscles and breathe evenly.  Cat-Cow  Do these steps until your lower back bends more easily:  1. Get on your hands and knees on a firm surface. Keep your hands under your shoulders, and keep your knees under your hips. You may put padding under your knees.  2. Let your head hang down, and make your tailbone point down to the floor so your lower back is round like the back of a cat.  3. Stay in this position for 5 seconds.  4. Slowly lift your head and make your tailbone point up to the ceiling so your back hangs low (sags) like the back of a cow.  5. Stay in this position for 5 seconds.    Press-Ups  Do these steps 5-10 times in a row:  1. Lie on your belly (face-down) on the floor.  2. Place your hands near your head,   about shoulder-width apart.  3. While you keep your back relaxed and keep your hips on the floor, slowly straighten your arms to raise the top half of your body and lift your shoulders. Do not use your back muscles. To make yourself more comfortable, you may change where you place your hands.  4. Stay in this position for 5 seconds.  5. Slowly return to lying flat on the floor.    Bridges  Do these steps 10 times in a row:  1. Lie on your back on a firm surface.  2. Bend your knees so they point up to the ceiling. Your feet should be flat on the floor.  3. Tighten your butt muscles and lift your butt off of the floor until your waist is almost as high as your knees. If you do not feel the muscles working in your butt and the back of your thighs, slide your feet 1-2 inches farther away from your butt.  4. Stay in this position for 3-5 seconds.  5. Slowly lower your butt to the floor, and let your butt muscles relax.  If this exercise is too easy, try doing it with your arms crossed over your chest.  Belly Crunches  Do these steps 5-10 times in a  row:  1. Lie on your back on a firm bed or the floor with your legs stretched out.  2. Bend your knees so they point up to the ceiling. Your feet should be flat on the floor.  3. Cross your arms over your chest.  4. Tip your chin a little bit toward your chest but do not bend your neck.  5. Tighten your belly muscles and slowly raise your chest just enough to lift your shoulder blades a tiny bit off of the floor.  6. Slowly lower your chest and your head to the floor.  Back Lifts  Do these steps 5-10 times in a row:  1. Lie on your belly (face-down) with your arms at your sides, and rest your forehead on the floor.  2. Tighten the muscles in your legs and your butt.  3. Slowly lift your chest off of the floor while you keep your hips on the floor. Keep the back of your head in line with the curve in your back. Look at the floor while you do this.  4. Stay in this position for 3-5 seconds.  5. Slowly lower your chest and your face to the floor.  Contact a doctor if:  · Your back pain gets a lot worse when you do an exercise.  · Your back pain does not lessen 2 hours after you exercise.  If you have any of these problems, stop doing the exercises. Do not do them again unless your doctor says it is okay.  Get help right away if:  · You have sudden, very bad back pain. If this happens, stop doing the exercises. Do not do them again unless your doctor says it is okay.  This information is not intended to replace advice given to you by your health care provider. Make sure you discuss any questions you have with your health care provider.  Document Released: 01/18/2011 Document Revised: 09/09/2018 Document Reviewed: 02/09/2015  Elsevier Interactive Patient Education © 2019 [employer]

## 2019-06-23 LAB — CBC WITH DIFFERENTIAL/PLATELET
Absolute Monocytes: 413 cells/uL (ref 200–950)
Basophils Absolute: 22 cells/uL (ref 0–200)
Basophils Relative: 0.4 %
Eosinophils Absolute: 83 cells/uL (ref 15–500)
Eosinophils Relative: 1.5 %
HCT: 39.8 % (ref 35.0–45.0)
Hemoglobin: 12.7 g/dL (ref 11.7–15.5)
Lymphs Abs: 1606 cells/uL (ref 850–3900)
MCH: 28.2 pg (ref 27.0–33.0)
MCHC: 31.9 g/dL — ABNORMAL LOW (ref 32.0–36.0)
MCV: 88.4 fL (ref 80.0–100.0)
MPV: 12.4 fL (ref 7.5–12.5)
Monocytes Relative: 7.5 %
Neutro Abs: 3377 cells/uL (ref 1500–7800)
Neutrophils Relative %: 61.4 %
Platelets: 280 10*3/uL (ref 140–400)
RBC: 4.5 10*6/uL (ref 3.80–5.10)
RDW: 14.2 % (ref 11.0–15.0)
Total Lymphocyte: 29.2 %
WBC: 5.5 10*3/uL (ref 3.8–10.8)

## 2019-06-23 LAB — COMPLETE METABOLIC PANEL WITH GFR
AG Ratio: 1.4 (calc) (ref 1.0–2.5)
ALT: 23 U/L (ref 6–29)
AST: 18 U/L (ref 10–35)
Albumin: 4.1 g/dL (ref 3.6–5.1)
Alkaline phosphatase (APISO): 79 U/L (ref 37–153)
BUN/Creatinine Ratio: 12 (calc) (ref 6–22)
BUN: 12 mg/dL (ref 7–25)
CO2: 25 mmol/L (ref 20–32)
Calcium: 9.4 mg/dL (ref 8.6–10.4)
Chloride: 103 mmol/L (ref 98–110)
Creat: 0.97 mg/dL — ABNORMAL HIGH (ref 0.60–0.93)
GFR, Est African American: 67 mL/min/{1.73_m2} (ref >=60)
GFR, Est Non African American: 58 mL/min/{1.73_m2} — ABNORMAL LOW (ref >=60)
Globulin: 2.9 g/dL (calc) (ref 1.9–3.7)
Glucose, Bld: 191 mg/dL — ABNORMAL HIGH (ref 65–99)
Potassium: 4.3 mmol/L (ref 3.5–5.3)
Sodium: 138 mmol/L (ref 135–146)
Total Bilirubin: 0.5 mg/dL (ref 0.2–1.2)
Total Protein: 7 g/dL (ref 6.1–8.1)

## 2019-06-23 LAB — URINALYSIS, ROUTINE W REFLEX MICROSCOPIC
Bilirubin Urine: NEGATIVE
Glucose, UA: NEGATIVE
Hgb urine dipstick: NEGATIVE
Nitrite: NEGATIVE
RBC / HPF: NONE SEEN /HPF (ref 0–2)
Specific Gravity, Urine: 1.026 (ref 1.001–1.03)
pH: <=5 (ref 5.0–8.0)

## 2019-06-23 LAB — URIC ACID: Uric Acid, Serum: 3.6 mg/dL (ref 2.5–7.0)

## 2019-06-23 NOTE — Progress Notes (Signed)
Creatinine is borderline elevated.  Please advise patient to avoid NSAIDs.  Glucose is elevated-191.  CBC stable. Uric acid is within desirable range.  UA revealed trace protein, 1+ leukocytes, and hyaline casts.  Please notify patient and advised patient to follow up with PCP to further discuss.  Please forward UA reveals to PCP.

## 2019-06-28 ENCOUNTER — Telehealth: Payer: Self-pay

## 2019-06-28 DIAGNOSIS — E1169 Type 2 diabetes mellitus with other specified complication: Secondary | ICD-10-CM | POA: Diagnosis not present

## 2019-06-28 DIAGNOSIS — Z7984 Long term (current) use of oral hypoglycemic drugs: Secondary | ICD-10-CM | POA: Diagnosis not present

## 2019-06-28 DIAGNOSIS — I10 Essential (primary) hypertension: Secondary | ICD-10-CM | POA: Diagnosis not present

## 2019-06-28 NOTE — Telephone Encounter (Signed)
Dr. Radene Ou medical assistant called requesting Dr. Estanislado Pandy speak with Dr. Radene Ou about patient's kidney function from labs on 06/22/2019. I called Dr. Estanislado Pandy and then provided Dr. Radene Ou medical assistant with Dr. Arlean Hopping cell phone number. Dr. Radene Ou will call Dr. Estanislado Pandy to discuss labs.

## 2019-07-20 DIAGNOSIS — E1169 Type 2 diabetes mellitus with other specified complication: Secondary | ICD-10-CM | POA: Diagnosis not present

## 2019-07-20 DIAGNOSIS — Z7984 Long term (current) use of oral hypoglycemic drugs: Secondary | ICD-10-CM | POA: Diagnosis not present

## 2019-07-20 DIAGNOSIS — I10 Essential (primary) hypertension: Secondary | ICD-10-CM | POA: Diagnosis not present

## 2019-07-20 DIAGNOSIS — E7800 Pure hypercholesterolemia, unspecified: Secondary | ICD-10-CM | POA: Diagnosis not present

## 2019-07-20 DIAGNOSIS — I7300 Raynaud's syndrome without gangrene: Secondary | ICD-10-CM | POA: Diagnosis not present

## 2019-07-20 DIAGNOSIS — M109 Gout, unspecified: Secondary | ICD-10-CM | POA: Diagnosis not present

## 2019-07-20 DIAGNOSIS — E039 Hypothyroidism, unspecified: Secondary | ICD-10-CM | POA: Diagnosis not present

## 2019-07-29 ENCOUNTER — Ambulatory Visit
Admission: RE | Admit: 2019-07-29 | Discharge: 2019-07-29 | Disposition: A | Discharge status: Home / Self Care | Payer: Medicare HMO | Source: Ambulatory Visit | Attending: Family Medicine | Admitting: Family Medicine

## 2019-07-29 ENCOUNTER — Other Ambulatory Visit: Payer: Self-pay

## 2019-07-29 DIAGNOSIS — Z1231 Encounter for screening mammogram for malignant neoplasm of breast: Secondary | ICD-10-CM | POA: Diagnosis not present

## 2019-08-03 DIAGNOSIS — R69 Illness, unspecified: Secondary | ICD-10-CM | POA: Diagnosis not present

## 2019-09-22 DIAGNOSIS — R69 Illness, unspecified: Secondary | ICD-10-CM | POA: Diagnosis not present

## 2019-09-23 DIAGNOSIS — Z7984 Long term (current) use of oral hypoglycemic drugs: Secondary | ICD-10-CM | POA: Diagnosis not present

## 2019-09-23 DIAGNOSIS — I10 Essential (primary) hypertension: Secondary | ICD-10-CM | POA: Diagnosis not present

## 2019-09-23 DIAGNOSIS — E039 Hypothyroidism, unspecified: Secondary | ICD-10-CM | POA: Diagnosis not present

## 2019-09-23 DIAGNOSIS — E7800 Pure hypercholesterolemia, unspecified: Secondary | ICD-10-CM | POA: Diagnosis not present

## 2019-09-23 DIAGNOSIS — E1169 Type 2 diabetes mellitus with other specified complication: Secondary | ICD-10-CM | POA: Diagnosis not present

## 2019-10-15 ENCOUNTER — Telehealth: Payer: Self-pay | Admitting: Rheumatology

## 2019-10-15 NOTE — Telephone Encounter (Signed)
Patient states she is out of her refills of the Colcrys. Patient states she needs a refill sent and if she does not receive the  Medication within the week she will need a sample. Patient advised will send refill and if she needs sample she may come by the office.   Last Visit: 06/22/19 Next Visit: 01/04/20 Labs: 06/22/19 Creatinine is borderline elevated.Glucose is elevated-191. CBC stable. Uric acid is within desirable range.  Okay to refill per Dr. Estanislado Pandy

## 2019-10-15 NOTE — Telephone Encounter (Signed)
Patient left a voicemail requesting a return call.  Patient states she has questions on the medication she receives a discount for.

## 2019-10-18 ENCOUNTER — Telehealth: Payer: Self-pay | Admitting: Rheumatology

## 2019-10-18 NOTE — Telephone Encounter (Signed)
Patient called and voiced she was having trouble refilling her Colcrys from [place]. Called Bernita Buffy and patient is still active until 12/30/2019. Set up patient's shipment, she will receive in 5-7 business days. Seth Bake will send in new prescription to [facility] for refills.  Phone# [phone]  10:39 AM Andrea Heath, CPhT

## 2019-10-18 NOTE — Telephone Encounter (Signed)
Patient left a voicemail requesting a return call regarding her medication refill.

## 2019-10-18 NOTE — Telephone Encounter (Signed)
Patient was having trouble scheduling her refill through [facility]. Patient said rep advised she was not in system. Called [employer], rep [name] found patient and scheduled refill to be received in 5-7 business days. Called patient to advise.  10:41 AM Andrea Heath, CPhT

## 2019-11-08 ENCOUNTER — Telehealth: Payer: Self-pay | Admitting: Pharmacy Technician

## 2019-11-08 DIAGNOSIS — Z7984 Long term (current) use of oral hypoglycemic drugs: Secondary | ICD-10-CM | POA: Diagnosis not present

## 2019-11-08 DIAGNOSIS — E1169 Type 2 diabetes mellitus with other specified complication: Secondary | ICD-10-CM | POA: Diagnosis not present

## 2019-11-08 DIAGNOSIS — I10 Essential (primary) hypertension: Secondary | ICD-10-CM | POA: Diagnosis not present

## 2019-11-08 DIAGNOSIS — E7800 Pure hypercholesterolemia, unspecified: Secondary | ICD-10-CM | POA: Diagnosis not present

## 2019-11-08 DIAGNOSIS — E039 Hypothyroidism, unspecified: Secondary | ICD-10-CM | POA: Diagnosis not present

## 2019-11-08 NOTE — Telephone Encounter (Signed)
Mailed patient renewal application for her Colcry patient assistance. Will fax once patient completes and returns.  12:00 PM Beatriz Chancellor, CPhT

## 2019-11-11 ENCOUNTER — Telehealth: Payer: Self-pay | Admitting: Rheumatology

## 2019-11-11 NOTE — Telephone Encounter (Signed)
Patient states she has not received the refill on Colcrys that was supposed to be shipped in October. Patient advised we would follow up with [employer].

## 2019-11-11 NOTE — Telephone Encounter (Signed)
Patient left a voicemail stating [facility] has no record of her prescription and she is running out of pills.  Patient is requesting a return call ASAP.

## 2019-11-12 ENCOUNTER — Telehealth: Payer: Self-pay

## 2019-11-12 MED ORDER — COLCHICINE 0.6 MG PO TABS: 0.6000 mg | ORAL_TABLET | Freq: Every day | ORAL | 0 refills | Status: DC

## 2019-11-12 NOTE — Telephone Encounter (Signed)
Patient states she is out of medication.   Medication Samples have been provided to the patient.  Drug name: mitigare    Strength: 0.6mg     Qty: 2 bottles LOT: [id number]  Exp.Date: 03/2020  Dosing instructions: Take 1 capsule by mouth daily.   The patient has been instructed regarding the correct time, dose, and frequency of taking this medication, including desired effects and most common side effects.   Andrea Heath 4:04 PM 11/12/2019

## 2019-11-12 NOTE — Telephone Encounter (Signed)
[employer] [facility] called requiring a verbal rx to refill patient's colcrys. Spoke with Lanny Hurst (pharmacist) and provided verbal rx.   Last Visit: 06/22/2019 Next Visit: 01/04/2020 Labs: 06/22/2019 Creatinine is borderline elevated. Glucose is elevated-191. CBC stable. Uric acid is within desirable range.   Okay to refill per Dr. Estanislado Pandy.

## 2019-12-03 NOTE — Telephone Encounter (Signed)
Faxed in provider portion for patient's [phone] Renewal application.

## 2020-01-04 ENCOUNTER — Ambulatory Visit: Payer: Medicare HMO | Admitting: Rheumatology

## 2020-01-19 DIAGNOSIS — H524 Presbyopia: Secondary | ICD-10-CM | POA: Diagnosis not present

## 2020-01-19 DIAGNOSIS — H35033 Hypertensive retinopathy, bilateral: Secondary | ICD-10-CM | POA: Diagnosis not present

## 2020-01-19 DIAGNOSIS — Z961 Presence of intraocular lens: Secondary | ICD-10-CM | POA: Diagnosis not present

## 2020-01-19 DIAGNOSIS — H52221 Regular astigmatism, right eye: Secondary | ICD-10-CM | POA: Diagnosis not present

## 2020-01-19 DIAGNOSIS — H35363 Drusen (degenerative) of macula, bilateral: Secondary | ICD-10-CM | POA: Diagnosis not present

## 2020-01-19 DIAGNOSIS — H35371 Puckering of macula, right eye: Secondary | ICD-10-CM | POA: Diagnosis not present

## 2020-01-19 DIAGNOSIS — H5213 Myopia, bilateral: Secondary | ICD-10-CM | POA: Diagnosis not present

## 2020-01-20 DIAGNOSIS — E039 Hypothyroidism, unspecified: Secondary | ICD-10-CM | POA: Diagnosis not present

## 2020-01-20 DIAGNOSIS — E7800 Pure hypercholesterolemia, unspecified: Secondary | ICD-10-CM | POA: Diagnosis not present

## 2020-01-20 DIAGNOSIS — I10 Essential (primary) hypertension: Secondary | ICD-10-CM | POA: Diagnosis not present

## 2020-01-20 DIAGNOSIS — Z0100 Encounter for examination of eyes and vision without abnormal findings: Secondary | ICD-10-CM | POA: Diagnosis not present

## 2020-01-20 DIAGNOSIS — E1169 Type 2 diabetes mellitus with other specified complication: Secondary | ICD-10-CM | POA: Diagnosis not present

## 2020-02-08 NOTE — Progress Notes (Signed)
Office Visit Note  Patient: Andrea Heath             Date of Birth: 17-Feb-1946           MRN: [id number]             PCP: Clayborn Heron, MD Referring: Clayborn Heron, MD Visit Date: 02/10/2020 Occupation: @GUAROCC @  Subjective:  Raynauds phenomenon.   History of Present Illness: Andrea Heath is a 74 y.o. female with history of systemic limited sclerosis.  She states she has been doing quite well.  She does experience Raynaud's during the winter months but it is manageable with warm clothing.  She has not noticed any increase in skin tightness.  She denies any shortness of breath.  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  Activities of Daily Living:  Patient reports morning stiffness for 0 minutes.   Patient Denies nocturnal pain.  Difficulty dressing/grooming: Denies Difficulty climbing stairs: Denies Difficulty getting out of chair: Denies Difficulty using hands for taps, buttons, cutlery, and/or writing: Reports  Review of Systems  Constitutional: Negative for fatigue.  HENT: Negative for mouth sores, mouth dryness and nose dryness.   Eyes: Negative for itching and dryness.  Respiratory: Negative for shortness of breath and difficulty breathing.   Cardiovascular: Negative for chest pain and palpitations.  Gastrointestinal: Negative for blood in stool, constipation and diarrhea.  Endocrine: Negative for increased urination.  Genitourinary: Negative for difficulty urinating and painful urination.  Musculoskeletal: Positive for arthralgias and joint pain. Negative for joint swelling and morning stiffness.  Skin: Positive for color change. Negative for rash.  Allergic/Immunologic: Negative for susceptible to infections.  Neurological: Positive for memory loss. Negative for dizziness, headaches and weakness.  Hematological: Negative for bruising/bleeding tendency.  Psychiatric/Behavioral: Negative for sleep disturbance. The  patient is not nervous/anxious.     PMFS History:  Patient Active Problem List   Diagnosis Date Noted  . 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  . 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 Chronic gout of foot 11/09/2016  . Age related osteoporosis 11/09/2016  . Hypothyroidism 11/09/2016  . Gastroesophageal reflux 11/09/2016  . Chest pain 01/19/2016  . DM type 2 (diabetes mellitus, type 2) (HCC) 01/19/2016  . Leg cramp 12/12/2014  . Pain, joint, multiple sites 09/08/2012  . Night sweats 06/04/2012  . Eyelid dermatitis, eczematous 04/30/2012  . General medical examination 12/03/2011  . URI 11/14/2010  . FOOT, PAIN 04/19/2010  . SINUSITIS 12/12/2009  . HYPERKALEMIA 11/27/2009  . SKIN LESION 07/27/2009  . LEG CRAMPS, NOCTURNAL 01/26/2009  . DEPRESSIVE DISORDER 10/20/2008  . SCLERODERMA 07/11/2008  . SEBORRHEIC KERATOSIS 03/08/2008  . RHINITIS 12/02/2007  . DIABETES MELLITUS, TYPE II 01/21/2007  . Dyslipidemia 01/21/2007  . Essential hypertension 01/21/2007    Past Medical History:  Diagnosis Date  . Diabetes mellitus   . GERD (gastroesophageal reflux disease)   . Gout   . Hyperlipidemia   . Hypertension   . Keratosis seborrheica   . Macular degeneration of both eyes   . Osteoporosis   . Raynaud's disease   . Rhinitis   . Scleroderma (HCC)     Family History  Problem Relation Age of Onset  . Stroke Mother   . Heart attack Mother   . Cancer Mother        breast cancer  . Breast cancer Mother 55  . Lung cancer Father   . Achalasia Brother   . Arrhythmia Brother   .  Arrhythmia Brother   . Cancer Son 20       bladder cancer    Past Surgical History:  Procedure Laterality Date  . BREAST EXCISIONAL BIOPSY Left 2005  . CHOLECYSTECTOMY    . TUBAL LIGATION     Social History   Social History Narrative  . Not on file   Immunization History  Administered Date(s) Administered  . Influenza Split 10/10/2011, 09/08/2012  . Influenza Whole 10/27/2007, 09/22/2008,  09/28/2009, 08/29/2010  . Influenza,inj,Quad PF,6+ Mos 09/21/2013, 09/27/2014  . Pneumococcal Polysaccharide-23 11/29/2010  . Td 06/24/2002, 12/13/2013  . Zoster 07/11/2008     Objective: Vital Signs: BP (!) 148/85 (BP Location: Right Arm, Patient Position: Sitting, Cuff Size: Normal)   Pulse 99   Resp 12   Ht 5\' 7"  (1.702 m)   Wt 162 lb 6.4 oz (73.7 kg)   BMI 25.44 kg/m    Physical Exam Vitals and nursing note reviewed.  Constitutional:      Appearance: She is well-developed.  HENT:     Head: Normocephalic and atraumatic.  Eyes:     Conjunctiva/sclera: Conjunctivae normal.  Cardiovascular:     Rate and Rhythm: Normal rate and regular rhythm.     Heart sounds: Normal heart sounds.  Pulmonary:     Effort: Pulmonary effort is normal.     Breath sounds: Normal breath sounds.  Abdominal:     General: Bowel sounds are normal.     Palpations: Abdomen is soft.  Musculoskeletal:     Cervical back: Normal range of motion.  Lymphadenopathy:     Cervical: No cervical adenopathy.  Skin:    General: Skin is warm and dry.     Capillary Refill: Capillary refill takes 2 to 3 seconds.     Comments: Sclerodactyly noted.  Telangiectasias are noted on palmar surface in her neck.  Mild digital cyanosis in her hands was noted.  Neurological:     Mental Status: She is alert and oriented to person, place, and time.  Psychiatric:        Behavior: Behavior normal.      Musculoskeletal Exam: C-spine was in good range of motion.  She some thoracic kyphosis.  Shoulder joints, elbow joints, wrist joints were in good range of motion.  She has some CMC thickening.  Hip joints, knee joints, ankles with good range of motion.  CDAI Exam: CDAI Score: -- Patient Global: --; Provider Global: -- Swollen: --; Tender: -- Joint Exam 02/10/2020   No joint exam has been documented for this visit   There is currently no information documented on the homunculus. Go to the Rheumatology activity and  complete the homunculus joint exam.  Investigation: No additional findings.  Imaging: No results found.  Recent Labs: Lab Results  Component Value Date   WBC 5.5 06/22/2019   HGB 12.7 06/22/2019   PLT 280 06/22/2019   NA 138 06/22/2019   K 4.3 06/22/2019   CL 103 06/22/2019   CO2 25 06/22/2019   GLUCOSE 191 (H) 06/22/2019   BUN 12 06/22/2019   CREATININE 0.97 (H) 06/22/2019   BILITOT 0.5 06/22/2019   ALKPHOS 54 06/25/2017   AST 18 06/22/2019   ALT 23 06/22/2019   PROT 7.0 06/22/2019   ALBUMIN 3.8 06/25/2017   CALCIUM 9.4 06/22/2019   GFRAA 67 06/22/2019    Speciality Comments: Dexa May 2019: T-score -2.1 left femoral neck Dexa July 2016: T-score -2.5 left femoral neck  Procedures:  No procedures performed Allergies: Patient has no known  allergies.   Assessment / Plan:     Visit Diagnoses: SCLERODERMA - Limited systemic sclerosis, sclerodactyly, Raynauds, Telengectesia's, positive ANA centromere, positive RF, positive CCP.  Patient had no synovitis on examination.  Her features of a started are stable.  Raynaud's disease without gangrene-she has been using gloves and warm clothing during the winter months.  She had mild digital cyanosis but no digital ulcers.  Idiopathic chronic gout of left foot without tophus - allopurinol 300 mg and colchicine 0.6 mg daily prn.  Patient denies having any gout flare.  We will check uric acid today.  Hyperuricemia  Age-related osteoporosis without current pathological fracture - DEXA  04/30/2018 BMD: 0.750, T-score: -2.1. July 2016 T score -2.5 left femoral neck.  She discontinued Fosamax per recommendations of her PCP according to pt.  She will get bone density this year.  I will check vitamin D level again.  Trochanteric bursitis of both hips-improved after the injections.  I would encourage her to do exercises on a regular basis.  Postural kyphosis of thoracic region-stretching exercises were discussed.  History of  hypertension-blood pressure is elevated today.  She states this usually happens after driving.  Her blood pressure has been normal at home.  History of diabetes mellitus, type II  Seborrheic keratosis  Orders: Orders Placed This Encounter  Procedures  . CBC with Differential/Platelet  . COMPLETE METABOLIC PANEL WITH GFR  . VITAMIN D 25 Hydroxy (Vit-D Deficiency, Fractures)  . Uric acid   No orders of the defined types were placed in this encounter.     Follow-Up Instructions: Return in about 6 months (around 08/09/2020) for Scleroderma.   Pollyann Savoy, MD  Note - This record has been created using Animal nutritionist.  Chart creation errors have been sought, but may not always  have been located. Such creation errors do not reflect on  the standard of medical care.

## 2020-02-10 ENCOUNTER — Encounter: Payer: Self-pay | Admitting: Physician Assistant

## 2020-02-10 ENCOUNTER — Ambulatory Visit: Payer: Medicare HMO | Admitting: Rheumatology

## 2020-02-10 ENCOUNTER — Other Ambulatory Visit: Payer: Self-pay

## 2020-02-10 VITALS — BP 148/85 | HR 99 | Resp 12 | Ht 67.0 in | Wt 162.4 lb

## 2020-02-10 DIAGNOSIS — E790 Hyperuricemia without signs of inflammatory arthritis and tophaceous disease: Secondary | ICD-10-CM

## 2020-02-10 DIAGNOSIS — M349 Systemic sclerosis, unspecified: Secondary | ICD-10-CM | POA: Diagnosis not present

## 2020-02-10 DIAGNOSIS — M7062 Trochanteric bursitis, left hip: Secondary | ICD-10-CM

## 2020-02-10 DIAGNOSIS — I7300 Raynaud's syndrome without gangrene: Secondary | ICD-10-CM | POA: Diagnosis not present

## 2020-02-10 DIAGNOSIS — M7061 Trochanteric bursitis, right hip: Secondary | ICD-10-CM | POA: Diagnosis not present

## 2020-02-10 DIAGNOSIS — Z8679 Personal history of other diseases of the circulatory system: Secondary | ICD-10-CM

## 2020-02-10 DIAGNOSIS — Z8639 Personal history of other endocrine, nutritional and metabolic disease: Secondary | ICD-10-CM | POA: Diagnosis not present

## 2020-02-10 DIAGNOSIS — M1A0720 Idiopathic chronic gout, left ankle and foot, without tophus (tophi): Secondary | ICD-10-CM

## 2020-02-10 DIAGNOSIS — L821 Other seborrheic keratosis: Secondary | ICD-10-CM | POA: Diagnosis not present

## 2020-02-10 DIAGNOSIS — M810 Age-related osteoporosis without current pathological fracture: Secondary | ICD-10-CM

## 2020-02-10 DIAGNOSIS — M4004 Postural kyphosis, thoracic region: Secondary | ICD-10-CM

## 2020-02-10 DIAGNOSIS — R69 Illness, unspecified: Secondary | ICD-10-CM | POA: Diagnosis not present

## 2020-02-11 ENCOUNTER — Ambulatory Visit: Payer: Medicare HMO | Attending: Internal Medicine

## 2020-02-11 DIAGNOSIS — Z23 Encounter for immunization: Secondary | ICD-10-CM | POA: Insufficient documentation

## 2020-02-11 LAB — COMPLETE METABOLIC PANEL WITH GFR
AG Ratio: 1.5 (calc) (ref 1.0–2.5)
ALT: 23 U/L (ref 6–29)
AST: 21 U/L (ref 10–35)
Albumin: 4.3 g/dL (ref 3.6–5.1)
Alkaline phosphatase (APISO): 80 U/L (ref 37–153)
BUN/Creatinine Ratio: 13 (calc) (ref 6–22)
BUN: 14 mg/dL (ref 7–25)
CO2: 26 mmol/L (ref 20–32)
Calcium: 9.6 mg/dL (ref 8.6–10.4)
Chloride: 105 mmol/L (ref 98–110)
Creat: 1.05 mg/dL — ABNORMAL HIGH (ref 0.60–0.93)
GFR, Est African American: 61 mL/min/{1.73_m2} (ref >=60)
GFR, Est Non African American: 52 mL/min/{1.73_m2} — ABNORMAL LOW (ref >=60)
Globulin: 2.9 g/dL (calc) (ref 1.9–3.7)
Glucose, Bld: 149 mg/dL — ABNORMAL HIGH (ref 65–99)
Potassium: 4.2 mmol/L (ref 3.5–5.3)
Sodium: 140 mmol/L (ref 135–146)
Total Bilirubin: 0.6 mg/dL (ref 0.2–1.2)
Total Protein: 7.2 g/dL (ref 6.1–8.1)

## 2020-02-11 LAB — CBC WITH DIFFERENTIAL/PLATELET
Absolute Monocytes: 439 cells/uL (ref 200–950)
Basophils Absolute: 11 cells/uL (ref 0–200)
Basophils Relative: 0.2 %
Eosinophils Absolute: 80 cells/uL (ref 15–500)
Eosinophils Relative: 1.4 %
HCT: 37.9 % (ref 35.0–45.0)
Hemoglobin: 12.4 g/dL (ref 11.7–15.5)
Lymphs Abs: 1459 cells/uL (ref 850–3900)
MCH: 28 pg (ref 27.0–33.0)
MCHC: 32.7 g/dL (ref 32.0–36.0)
MCV: 85.6 fL (ref 80.0–100.0)
MPV: 13 fL — ABNORMAL HIGH (ref 7.5–12.5)
Monocytes Relative: 7.7 %
Neutro Abs: 3711 cells/uL (ref 1500–7800)
Neutrophils Relative %: 65.1 %
Platelets: 242 10*3/uL (ref 140–400)
RBC: 4.43 10*6/uL (ref 3.80–5.10)
RDW: 14.5 % (ref 11.0–15.0)
Total Lymphocyte: 25.6 %
WBC: 5.7 10*3/uL (ref 3.8–10.8)

## 2020-02-11 LAB — VITAMIN D 25 HYDROXY (VIT D DEFICIENCY, FRACTURES): Vit D, 25-Hydroxy: 49 ng/mL (ref 30–100)

## 2020-02-11 LAB — URIC ACID: Uric Acid, Serum: 3.6 mg/dL (ref 2.5–7.0)

## 2020-02-11 NOTE — Progress Notes (Signed)
CBC is within normal limits.,  Glucose is elevated.  Creatinine is mildly elevated we will continue to monitor.  Vitamin D is normal and uric acid is in desirable range.

## 2020-02-11 NOTE — Progress Notes (Signed)
   Covid-19 Vaccination Clinic  Name:  Andrea Heath    MRN: [id number] DOB: [birth year]  02/11/2020  Andrea Heath was observed post Covid-19 immunization for 15 minutes without incidence. She was provided with Vaccine Information Sheet and instruction to access the V-Safe system.   Andrea Heath was instructed to call 911 with any severe reactions post vaccine: Marland Kitchen Difficulty breathing  . Swelling of your face and throat  . A fast heartbeat  . A bad rash all over your body  . Dizziness and weakness    Immunizations Administered    Name Date Dose VIS Date Route   Pfizer COVID-19 Vaccine 02/11/2020 11:59 AM 0.3 mL 12/10/2019 Intramuscular   Manufacturer: [employer], [employer]   Lot: [id number]   NDC: [address]-6

## 2020-02-15 DIAGNOSIS — R69 Illness, unspecified: Secondary | ICD-10-CM | POA: Diagnosis not present

## 2020-02-22 DIAGNOSIS — E1169 Type 2 diabetes mellitus with other specified complication: Secondary | ICD-10-CM | POA: Diagnosis not present

## 2020-02-22 DIAGNOSIS — I10 Essential (primary) hypertension: Secondary | ICD-10-CM | POA: Diagnosis not present

## 2020-02-22 DIAGNOSIS — E7800 Pure hypercholesterolemia, unspecified: Secondary | ICD-10-CM | POA: Diagnosis not present

## 2020-02-22 DIAGNOSIS — E039 Hypothyroidism, unspecified: Secondary | ICD-10-CM | POA: Diagnosis not present

## 2020-02-24 DIAGNOSIS — R42 Dizziness and giddiness: Secondary | ICD-10-CM | POA: Diagnosis not present

## 2020-02-24 DIAGNOSIS — R195 Other fecal abnormalities: Secondary | ICD-10-CM | POA: Diagnosis not present

## 2020-02-24 NOTE — Telephone Encounter (Signed)
Returned patient's call, patient states she doesn't have a way to copy her tax forms to send for her Colcrys patient assistance. She will stop by office to have documents faxed.   11:47 AM Dorthula Nettles, CPhT

## 2020-02-25 NOTE — Telephone Encounter (Signed)
Submitted Patient Assistance Application to [place] for Col[employer]rys along with provider portion, PA and income documents. Will update patient when we receive a response.  Fax# [phone] Phone# [phone]

## 2020-03-02 ENCOUNTER — Telehealth: Payer: Self-pay | Admitting: Rheumatology

## 2020-03-02 NOTE — Telephone Encounter (Signed)
[employer], patient had 2 cases open due to someone misspelling her last name. Rep connected accounts and states they have complete application and it appears patient will be approved. Shipment will ship in 5-7 business days. Called patient to advise, she states she has 6 days of medication on hand and will reach out to office if she needs another sample.  Phone# [phone]  10:11 AM Dorthula Nettles, CPhT

## 2020-03-02 NOTE — Telephone Encounter (Signed)
Patient states [facility] told patient they needed financial information for her application before she can get her Colcyrs 0.6 mg. Please investigate for patient, and advise. Patient states if she is not able to answer, please leave a message, and she will return your call.

## 2020-03-02 NOTE — Telephone Encounter (Signed)
Patient called to check the status of her prescription refill of Colchicine.  Patient states "she dropped off application paperwork to the office last week that was suppose to be sent to [facility]."  Patient requested a return call.

## 2020-03-02 NOTE — Telephone Encounter (Signed)
There is another encounter regarding this. Closing encounter.

## 2020-03-04 ENCOUNTER — Ambulatory Visit: Payer: Medicare HMO | Attending: Internal Medicine

## 2020-03-04 DIAGNOSIS — Z23 Encounter for immunization: Secondary | ICD-10-CM | POA: Insufficient documentation

## 2020-03-04 NOTE — Progress Notes (Signed)
   Covid-19 Vaccination Clinic  Name:  Andrea Heath    MRN: [id number] DOB: Jan 10, 1946  03/04/2020  [name] was observed post Covid-19 immunization for 15 minutes without incident. She was provided with Vaccine Information Sheet and instruction to access the V-Safe system.   [name] was instructed to call 911 with any severe reactions post vaccine: Marland Kitchen Difficulty breathing  . Swelling of face and throat  . A fast heartbeat  . A bad rash all over body  . Dizziness and weakness   Immunizations Administered    Name Date Dose VIS Date Route   Pfizer COVID-19 Vaccine 03/04/2020  3:18 PM 0.3 mL 12/10/2019 Intramuscular   Manufacturer: [employer], [employer]   Lot: [id number]   NDC: [address]-0

## 2020-04-07 DIAGNOSIS — E7800 Pure hypercholesterolemia, unspecified: Secondary | ICD-10-CM | POA: Diagnosis not present

## 2020-04-07 DIAGNOSIS — E1169 Type 2 diabetes mellitus with other specified complication: Secondary | ICD-10-CM | POA: Diagnosis not present

## 2020-04-07 DIAGNOSIS — I10 Essential (primary) hypertension: Secondary | ICD-10-CM | POA: Diagnosis not present

## 2020-04-07 DIAGNOSIS — E039 Hypothyroidism, unspecified: Secondary | ICD-10-CM | POA: Diagnosis not present

## 2020-05-09 DIAGNOSIS — K219 Gastro-esophageal reflux disease without esophagitis: Secondary | ICD-10-CM | POA: Diagnosis not present

## 2020-05-09 DIAGNOSIS — R0981 Nasal congestion: Secondary | ICD-10-CM | POA: Diagnosis not present

## 2020-05-09 DIAGNOSIS — E1169 Type 2 diabetes mellitus with other specified complication: Secondary | ICD-10-CM | POA: Diagnosis not present

## 2020-05-09 DIAGNOSIS — E039 Hypothyroidism, unspecified: Secondary | ICD-10-CM | POA: Diagnosis not present

## 2020-05-09 DIAGNOSIS — Z86010 Personal history of colonic polyps: Secondary | ICD-10-CM | POA: Diagnosis not present

## 2020-05-09 DIAGNOSIS — M109 Gout, unspecified: Secondary | ICD-10-CM | POA: Diagnosis not present

## 2020-05-09 DIAGNOSIS — I7300 Raynaud's syndrome without gangrene: Secondary | ICD-10-CM | POA: Diagnosis not present

## 2020-05-09 DIAGNOSIS — Z0000 Encounter for general adult medical examination without abnormal findings: Secondary | ICD-10-CM | POA: Diagnosis not present

## 2020-05-09 DIAGNOSIS — E7800 Pure hypercholesterolemia, unspecified: Secondary | ICD-10-CM | POA: Diagnosis not present

## 2020-05-09 DIAGNOSIS — I10 Essential (primary) hypertension: Secondary | ICD-10-CM | POA: Diagnosis not present

## 2020-05-12 DIAGNOSIS — E7800 Pure hypercholesterolemia, unspecified: Secondary | ICD-10-CM | POA: Diagnosis not present

## 2020-05-12 DIAGNOSIS — I10 Essential (primary) hypertension: Secondary | ICD-10-CM | POA: Diagnosis not present

## 2020-05-12 DIAGNOSIS — E1169 Type 2 diabetes mellitus with other specified complication: Secondary | ICD-10-CM | POA: Diagnosis not present

## 2020-05-12 DIAGNOSIS — E039 Hypothyroidism, unspecified: Secondary | ICD-10-CM | POA: Diagnosis not present

## 2020-05-30 DIAGNOSIS — E039 Hypothyroidism, unspecified: Secondary | ICD-10-CM | POA: Diagnosis not present

## 2020-05-30 DIAGNOSIS — I10 Essential (primary) hypertension: Secondary | ICD-10-CM | POA: Diagnosis not present

## 2020-05-30 DIAGNOSIS — E1169 Type 2 diabetes mellitus with other specified complication: Secondary | ICD-10-CM | POA: Diagnosis not present

## 2020-05-30 DIAGNOSIS — E7800 Pure hypercholesterolemia, unspecified: Secondary | ICD-10-CM | POA: Diagnosis not present

## 2020-06-06 ENCOUNTER — Telehealth: Payer: Self-pay | Admitting: Rheumatology

## 2020-06-06 MED ORDER — COLCHICINE 0.6 MG PO TABS: 0.6000 mg | ORAL_TABLET | Freq: Every day | ORAL | 0 refills | Status: DC

## 2020-06-06 NOTE — Telephone Encounter (Signed)
Patient called requesting prescription refill of Colcyrs.   Patient states she has no refills remaining and was told to contact Dr. Fatima Sanger office.

## 2020-06-06 NOTE — Telephone Encounter (Signed)
Last Visit: 02/10/2020 Next Visit: 08/08/2020 Labs: 02/10/2020 CBC is within normal limits., Glucose is elevated. Creatinine is mildly elevated uric acid is in desirable range.  Current Dose per office note 02/10/2020: colchicine 0.6 mg daily prn  Okay to refill per Dr. Corliss Skains   Patient fills this prescription with the [employer].

## 2020-07-18 DIAGNOSIS — I10 Essential (primary) hypertension: Secondary | ICD-10-CM | POA: Diagnosis not present

## 2020-07-18 DIAGNOSIS — E1169 Type 2 diabetes mellitus with other specified complication: Secondary | ICD-10-CM | POA: Diagnosis not present

## 2020-07-18 DIAGNOSIS — E039 Hypothyroidism, unspecified: Secondary | ICD-10-CM | POA: Diagnosis not present

## 2020-07-18 DIAGNOSIS — E7800 Pure hypercholesterolemia, unspecified: Secondary | ICD-10-CM | POA: Diagnosis not present

## 2020-07-25 NOTE — Progress Notes (Signed)
Office Visit Note  Patient: Andrea Heath             Date of Birth: 12/21/1946           MRN: [id number]             PCP: Clayborn Heron, MD Referring: Clayborn Heron, MD Visit Date: 08/08/2020 Occupation: @GUAROCC @  Subjective:  Medication monitoring.   History of Present Illness: Andrea Heath is a 74 y.o. female with history of scleroderma, gout and osteoarthritis.  She states she continues to have some Raynaud's symptoms.  She has not noticed any increase in skin tightness.  She denies any shortness of breath or palpitations.  She has not noticed any digital ulcers.  She denies having any gout flares.  Activities of Daily Living:  Patient reports morning stiffness for 0  minutes.   Patient Denies nocturnal pain.  Difficulty dressing/grooming: Denies Difficulty climbing stairs: Denies Difficulty getting out of chair: Denies Difficulty using hands for taps, buttons, cutlery, and/or writing: Reports  Review of Systems  Constitutional: Negative for fatigue, night sweats, weight gain and weight loss.  HENT: Negative for mouth sores, trouble swallowing, trouble swallowing, mouth dryness and nose dryness.   Eyes: Negative for pain, redness, itching, visual disturbance and dryness.  Respiratory: Negative for cough, shortness of breath and difficulty breathing.   Cardiovascular: Negative for chest pain, palpitations, hypertension, irregular heartbeat and swelling in legs/feet.  Gastrointestinal: Negative for blood in stool, constipation and diarrhea.  Endocrine: Negative for increased urination.  Genitourinary: Negative for difficulty urinating and vaginal dryness.  Musculoskeletal: Positive for arthralgias and joint pain. Negative for joint swelling, myalgias, muscle weakness, morning stiffness, muscle tenderness and myalgias.  Skin: Positive for color change. Negative for rash, hair loss, redness, skin tightness, ulcers and sensitivity to sunlight.   Allergic/Immunologic: Negative for susceptible to infections.  Neurological: Positive for memory loss. Negative for dizziness, numbness, headaches, night sweats and weakness.  Hematological: Negative for bruising/bleeding tendency and swollen glands.  Psychiatric/Behavioral: Negative for depressed mood, confusion and sleep disturbance. The patient is not nervous/anxious.     PMFS History:  Patient Active Problem List   Diagnosis Date Noted  . 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  . 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 Chronic gout of foot 11/09/2016  . Age related osteoporosis 11/09/2016  . Hypothyroidism 11/09/2016  . Gastroesophageal reflux 11/09/2016  . Chest pain 01/19/2016  . DM type 2 (diabetes mellitus, type 2) (HCC) 01/19/2016  . Leg cramp 12/12/2014  . Pain, joint, multiple sites 09/08/2012  . Night sweats 06/04/2012  . Eyelid dermatitis, eczematous 04/30/2012  . General medical examination 12/03/2011  . URI 11/14/2010  . FOOT, PAIN 04/19/2010  . SINUSITIS 12/12/2009  . HYPERKALEMIA 11/27/2009  . SKIN LESION 07/27/2009  . LEG CRAMPS, NOCTURNAL 01/26/2009  . DEPRESSIVE DISORDER 10/20/2008  . SCLERODERMA 07/11/2008  . SEBORRHEIC KERATOSIS 03/08/2008  . RHINITIS 12/02/2007  . DIABETES MELLITUS, TYPE II 01/21/2007  . Dyslipidemia 01/21/2007  . Essential hypertension 01/21/2007    Past Medical History:  Diagnosis Date  . Diabetes mellitus   . GERD (gastroesophageal reflux disease)   . Gout   . Hyperlipidemia   . Hypertension   . Keratosis seborrheica   . Macular degeneration of both eyes   . Osteoporosis   . Raynaud's disease   . Rhinitis   . Scleroderma (HCC)     Family History  Problem Relation Age of Onset  . Stroke Mother   . Heart attack  Mother   . Cancer Mother        breast cancer  . Breast cancer Mother 29  . Lung cancer Father   . Achalasia Brother   . Arrhythmia Brother   . Arrhythmia Brother   . Cancer Son 20       bladder cancer     Past Surgical History:  Procedure Laterality Date  . BREAST EXCISIONAL BIOPSY Left 2005  . CHOLECYSTECTOMY    . TUBAL LIGATION     Social History   Social History Narrative  . Not on file   Immunization History  Administered Date(s) Administered  . Influenza Split 10/10/2011, 09/08/2012  . Influenza Whole 10/27/2007, 09/22/2008, 09/28/2009, 08/29/2010  . Influenza,inj,Quad PF,6+ Mos 09/21/2013, 09/27/2014  . PFIZER SARS-COV-2 Vaccination 02/11/2020, 03/04/2020  . Pneumococcal Polysaccharide-23 11/29/2010  . Td 06/24/2002, 12/13/2013  . Zoster 07/11/2008     Objective: Vital Signs: BP (!) 145/82 (BP Location: Left Arm, Patient Position: Sitting, Cuff Size: Normal)   Pulse 90   Resp 14   Ht 5\' 7"  (1.702 m)   Wt 167 lb 9.6 oz (76 kg)   BMI 26.25 kg/m    Physical Exam Vitals and nursing note reviewed.  Constitutional:      Appearance: She is well-developed.  HENT:     Head: Normocephalic and atraumatic.  Eyes:     Conjunctiva/sclera: Conjunctivae normal.  Cardiovascular:     Rate and Rhythm: Normal rate and regular rhythm.     Heart sounds: Normal heart sounds.  Pulmonary:     Effort: Pulmonary effort is normal.     Breath sounds: Normal breath sounds.  Abdominal:     General: Bowel sounds are normal.     Palpations: Abdomen is soft.  Musculoskeletal:     Cervical back: Normal range of motion.  Lymphadenopathy:     Cervical: No cervical adenopathy.  Skin:    General: Skin is warm and dry.     Capillary Refill: Capillary refill takes less than 2 seconds.     Comments: Bilateral sclerodactyly was noted.  No digital ulcers were noted.  Telangiectasias were noted.  Nailbed capillary changes were noted  Neurological:     Mental Status: She is alert and oriented to person, place, and time.  Psychiatric:        Behavior: Behavior normal.      Musculoskeletal Exam: C-spine was in good range of motion.  She is in partial thoracic kyphosis.  Shoulder joint, elbow  joints wrist joints with good range of motion.  She is in complete fist formation due to sclerodactyly.  Hip joints, knee joints, ankles were in good range of motion.  No digital ulcers were noted on her toes.  CDAI Exam: CDAI Score: -- Patient Global: --; Provider Global: -- Swollen: --; Tender: -- Joint Exam 08/08/2020   No joint exam has been documented for this visit   There is currently no information documented on the homunculus. Go to the Rheumatology activity and complete the homunculus joint exam.  Investigation: No additional findings.  Imaging: No results found.  Recent Labs: Lab Results  Component Value Date   WBC 5.7 02/10/2020   HGB 12.4 02/10/2020   PLT 242 02/10/2020   NA 140 02/10/2020   K 4.2 02/10/2020   CL 105 02/10/2020   CO2 26 02/10/2020   GLUCOSE 149 (H) 02/10/2020   BUN 14 02/10/2020   CREATININE 1.05 (H) 02/10/2020   BILITOT 0.6 02/10/2020   ALKPHOS 54 06/25/2017  AST 21 02/10/2020   ALT 23 02/10/2020   PROT 7.2 02/10/2020   ALBUMIN 3.8 06/25/2017   CALCIUM 9.6 02/10/2020   GFRAA 61 02/10/2020    Speciality Comments: Dexa May 2019: T-score -2.1 left femoral neck Dexa July 2016: T-score -2.5 left femoral neck  Procedures:  No procedures performed Allergies: Patient has no known allergies.   Assessment / Plan:     Visit Diagnoses: SCLERODERMA - Limited systemic sclerosis, sclerodactyly, Raynauds, Telengectesia's, positive ANA centromere, positive RF, positive CCP.  Sclerodactyly is distal to her wrist joints.  She has not noted any progression of the skin disease.  She has Raynaud's but no digital ulcers were noted.  She has been using gloves and keeping her body temperature warm.  I will schedule high-resolution CT to evaluate for ILD.  She denies any shortness of breath.  She seen Dr. Gala Romney in 2018.  I have advised her to schedule a follow-up appointment.  Patient would prefer for [place] to schedule the appointment for her.  Raynaud's  disease without gangrene-symptomatic  Idiopathic chronic gout of left foot without tophus - allopurinol 300 mg and colchicine 0.6 mg daily prn.  Her uric acid has been in desirable range.  She denies any gout flares.  Hyperuricemia - uric acid: 02/10/2020 3.6  Age-related osteoporosis without current pathological fracture - DEXA  04/30/2018 BMD: 0.750, T-score: -2.1. July 2016 T score -2.5 left femoral neck.  She discontinued Fosamax per recommendations of her PCP according to pt  Trochanteric bursitis of both hips-resolved.  Postural kyphosis of thoracic region-stretching exercises were emphasized.  History of diabetes mellitus, type II  History of hypertension  Seborrheic keratosis  Patient is fully vaccinated against COVID-19.  Use of mask, social distancing and hand hygiene was emphasized.  I also advised in case she develops COVID-19 infection she will be candidate for antibody infusion.  Orders: Orders Placed This Encounter  Procedures  . CT Chest High Resolution  . CBC with Differential/Platelet  . COMPLETE METABOLIC PANEL WITH GFR  . Uric acid   No orders of the defined types were placed in this encounter.    Follow-Up Instructions: Return in about 6 months (around 02/08/2021) for Scleroderma, Gout.   Pollyann Savoy, MD  Note - This record has been created using Animal nutritionist.  Chart creation errors have been sought, but may not always  have been located. Such creation errors do not reflect on  the standard of medical care.

## 2020-08-08 ENCOUNTER — Other Ambulatory Visit: Payer: Self-pay

## 2020-08-08 ENCOUNTER — Ambulatory Visit: Payer: Medicare HMO | Admitting: Rheumatology

## 2020-08-08 ENCOUNTER — Encounter: Payer: Self-pay | Admitting: Rheumatology

## 2020-08-08 ENCOUNTER — Telehealth (HOSPITAL_COMMUNITY): Payer: Self-pay | Admitting: *Deleted

## 2020-08-08 VITALS — BP 145/82 | HR 90 | Resp 14 | Ht 67.0 in | Wt 167.6 lb

## 2020-08-08 DIAGNOSIS — M349 Systemic sclerosis, unspecified: Secondary | ICD-10-CM | POA: Diagnosis not present

## 2020-08-08 DIAGNOSIS — L821 Other seborrheic keratosis: Secondary | ICD-10-CM

## 2020-08-08 DIAGNOSIS — Z5181 Encounter for therapeutic drug level monitoring: Secondary | ICD-10-CM

## 2020-08-08 DIAGNOSIS — I7300 Raynaud's syndrome without gangrene: Secondary | ICD-10-CM | POA: Diagnosis not present

## 2020-08-08 DIAGNOSIS — Z8639 Personal history of other endocrine, nutritional and metabolic disease: Secondary | ICD-10-CM | POA: Diagnosis not present

## 2020-08-08 DIAGNOSIS — Z8679 Personal history of other diseases of the circulatory system: Secondary | ICD-10-CM

## 2020-08-08 DIAGNOSIS — M1A0720 Idiopathic chronic gout, left ankle and foot, without tophus (tophi): Secondary | ICD-10-CM

## 2020-08-08 DIAGNOSIS — E790 Hyperuricemia without signs of inflammatory arthritis and tophaceous disease: Secondary | ICD-10-CM

## 2020-08-08 DIAGNOSIS — M810 Age-related osteoporosis without current pathological fracture: Secondary | ICD-10-CM | POA: Diagnosis not present

## 2020-08-08 DIAGNOSIS — M4004 Postural kyphosis, thoracic region: Secondary | ICD-10-CM | POA: Diagnosis not present

## 2020-08-08 NOTE — Patient Instructions (Addendum)
   COVID-19 vaccine recommendations:   COVID-19 vaccine is recommended for everyone (unless you are allergic to a vaccine component), even if you are on a medication that suppresses your immune system.   If you are on Methotrexate, Cellcept (mycophenolate), Rinvoq, Xeljanz, and Olumiant- hold the medication for 1 week after each vaccine. Hold Methotrexate for 2 weeks after the single dose COVID-19 vaccine.   If you are on Orencia subcutaneous injection - hold medication one week prior to and one week after the first COVID-19 vaccine dose (only).   If you are on Orencia IV infusions- time vaccination administration so that the first COVID-19 vaccination will occur four weeks after the infusion and postpone the subsequent infusion by one week.   If you are on Cyclophosphamide or Rituxan infusions please contact your doctor prior to receiving the COVID-19 vaccine.   Do not take Tylenol or ant anti-inflammatory medications (NSAIDs) 24 hours prior to the COVID-19 vaccination.   There is no direct evidence about the efficacy of the COVID-19 vaccine in individuals who are on medications that suppress the immune system.   Even if you are fully vaccinated, and you are on any medications that suppress your immune system, please continue to wear a mask, maintain at least six feet social distance and practice hand hygiene.   If you develop a COVID-19 infection, please contact your PCP or our office to determine if you need antibody infusion.  We anticipate that a booster vaccine will be available soon for immunosuppressed individuals. Please cal our office before receiving your booster dose to make adjustments to your medication regimen.  

## 2020-08-08 NOTE — Telephone Encounter (Signed)
Per Dr Corliss Skains pt needs pfts, echo, and f/u appt with Dr Gala Romney for her scleroderma, screening for pah. Orders placed will schedule

## 2020-08-09 LAB — CBC WITH DIFFERENTIAL/PLATELET
Absolute Monocytes: 432 cells/uL (ref 200–950)
Basophils Absolute: 11 cells/uL (ref 0–200)
Basophils Relative: 0.2 %
Eosinophils Absolute: 59 cells/uL (ref 15–500)
Eosinophils Relative: 1.1 %
HCT: 35 % (ref 35.0–45.0)
Hemoglobin: 11.2 g/dL — ABNORMAL LOW (ref 11.7–15.5)
Lymphs Abs: 1787 cells/uL (ref 850–3900)
MCH: 25.7 pg — ABNORMAL LOW (ref 27.0–33.0)
MCHC: 32 g/dL (ref 32.0–36.0)
MCV: 80.5 fL (ref 80.0–100.0)
MPV: 13 fL — ABNORMAL HIGH (ref 7.5–12.5)
Monocytes Relative: 8 %
Neutro Abs: 3110 cells/uL (ref 1500–7800)
Neutrophils Relative %: 57.6 %
Platelets: 237 10*3/uL (ref 140–400)
RBC: 4.35 10*6/uL (ref 3.80–5.10)
RDW: 15.6 % — ABNORMAL HIGH (ref 11.0–15.0)
Total Lymphocyte: 33.1 %
WBC: 5.4 10*3/uL (ref 3.8–10.8)

## 2020-08-09 LAB — COMPLETE METABOLIC PANEL WITH GFR
AG Ratio: 1.5 (calc) (ref 1.0–2.5)
ALT: 18 U/L (ref 6–29)
AST: 19 U/L (ref 10–35)
Albumin: 4.1 g/dL (ref 3.6–5.1)
Alkaline phosphatase (APISO): 84 U/L (ref 37–153)
BUN/Creatinine Ratio: 13 (calc) (ref 6–22)
BUN: 13 mg/dL (ref 7–25)
CO2: 26 mmol/L (ref 20–32)
Calcium: 9.3 mg/dL (ref 8.6–10.4)
Chloride: 104 mmol/L (ref 98–110)
Creat: 1.01 mg/dL — ABNORMAL HIGH (ref 0.60–0.93)
GFR, Est African American: 63 mL/min/{1.73_m2} (ref >=60)
GFR, Est Non African American: 54 mL/min/{1.73_m2} — ABNORMAL LOW (ref >=60)
Globulin: 2.8 g/dL (calc) (ref 1.9–3.7)
Glucose, Bld: 131 mg/dL — ABNORMAL HIGH (ref 65–99)
Potassium: 4.6 mmol/L (ref 3.5–5.3)
Sodium: 136 mmol/L (ref 135–146)
Total Bilirubin: 0.4 mg/dL (ref 0.2–1.2)
Total Protein: 6.9 g/dL (ref 6.1–8.1)

## 2020-08-09 LAB — URIC ACID: Uric Acid, Serum: 3.9 mg/dL (ref 2.5–7.0)

## 2020-08-09 NOTE — Progress Notes (Signed)
Uric acid is in desirable range.  Mild anemia noted.  GFR is low but is stable.

## 2020-08-11 ENCOUNTER — Telehealth: Payer: Self-pay | Admitting: Rheumatology

## 2020-08-11 DIAGNOSIS — Z1231 Encounter for screening mammogram for malignant neoplasm of breast: Secondary | ICD-10-CM | POA: Diagnosis not present

## 2020-08-11 NOTE — Telephone Encounter (Signed)
Patient left a voicemail stating she went to have her mammogram and she was suppose to also have her bone density scan, but was told it has not been approved by our office.  Patient states "now I am going to have to make another trip over there."  Patient is requesting a return call.

## 2020-08-11 NOTE — Telephone Encounter (Signed)
Patient advised we are unable to order her bone density scan as she has it done at [facility]. Patient advised it would need to be ordered by her PCP. Patient states she will contact them next week.

## 2020-08-16 ENCOUNTER — Other Ambulatory Visit: Payer: Self-pay | Admitting: Family Medicine

## 2020-08-16 ENCOUNTER — Telehealth (HOSPITAL_COMMUNITY): Payer: Self-pay | Admitting: Internal Medicine

## 2020-08-16 DIAGNOSIS — M8580 Other specified disorders of bone density and structure, unspecified site: Secondary | ICD-10-CM

## 2020-08-16 NOTE — Telephone Encounter (Signed)
error 

## 2020-08-29 DIAGNOSIS — R0981 Nasal congestion: Secondary | ICD-10-CM | POA: Diagnosis not present

## 2020-08-29 DIAGNOSIS — J029 Acute pharyngitis, unspecified: Secondary | ICD-10-CM | POA: Diagnosis not present

## 2020-08-29 DIAGNOSIS — U071 COVID-19: Secondary | ICD-10-CM | POA: Diagnosis not present

## 2020-08-29 DIAGNOSIS — Z20828 Contact with and (suspected) exposure to other viral communicable diseases: Secondary | ICD-10-CM | POA: Diagnosis not present

## 2020-08-30 ENCOUNTER — Telehealth: Payer: Self-pay | Admitting: Infectious Diseases

## 2020-08-30 ENCOUNTER — Other Ambulatory Visit: Payer: Self-pay | Admitting: Infectious Diseases

## 2020-08-30 DIAGNOSIS — R54 Age-related physical debility: Secondary | ICD-10-CM

## 2020-08-30 DIAGNOSIS — U071 COVID-19: Secondary | ICD-10-CM

## 2020-08-30 NOTE — Progress Notes (Signed)
I connected by phone with Andrea Heath on 08/30/2020 at 3:26 PM to discuss the potential use of a new treatment for mild to moderate COVID-19 viral infection in non-hospitalized patients.  This patient is a 74 y.o. female that meets the FDA criteria for Emergency Use Authorization of COVID monoclonal antibody casirivimab/imdevimab.  Has a (+) direct SARS-CoV-2 viral test result  Has mild or moderate COVID-19   Is NOT hospitalized due to COVID-19  Is within 10 days of symptom onset  Has at least one of the high risk factor(s) for progression to severe COVID-19 and/or hospitalization as defined in EUA.  Specific high risk criteria : Older age (>/= 75 yo)   I have spoken and communicated the following to the patient or parent/caregiver regarding COVID monoclonal antibody treatment:  1. FDA has authorized the emergency use for the treatment of mild to moderate COVID-19 in adults and pediatric patients with positive results of direct SARS-CoV-2 viral testing who are 6 years of age and older weighing at least 40 kg, and who are at high risk for progressing to severe COVID-19 and/or hospitalization.  2. The significant known and potential risks and benefits of COVID monoclonal antibody, and the extent to which such potential risks and benefits are unknown.  3. Information on available alternative treatments and the risks and benefits of those alternatives, including clinical trials.  4. Patients treated with COVID monoclonal antibody should continue to self-isolate and use infection control measures (e.g., wear mask, isolate, social distance, avoid sharing personal items, clean and disinfect "high touch" surfaces, and frequent handwashing) according to CDC guidelines.   5. The patient or parent/caregiver has the option to accept or refuse COVID monoclonal antibody treatment.  After reviewing this information with the patient, The patient agreed to proceed with receiving casirivimab\imdevimab  infusion and will be provided a copy of the Fact sheet prior to receiving the infusion. Andrea Heath 08/30/2020 3:26 PM

## 2020-08-30 NOTE — Telephone Encounter (Signed)
Called to Discuss with patient about Covid symptoms and the use of the monoclonal antibody infusion for those with mild to moderate Covid symptoms and at a high risk of hospitalization.     Pt appears to qualify for this infusion due to co-morbid conditions and/or a member of an at-risk group in accordance with the FDA Emergency Use Authorization.    Patient husband is positive for infection. She started showing symptoms on Monday 8/30. Scheduled in AM.    Rexene Alberts, MSN, NP-C [facility]  [email or web address] Pager: [phone] Office: [phone] [facility] Main Line: [phone]

## 2020-08-31 ENCOUNTER — Ambulatory Visit (HOSPITAL_COMMUNITY)
Admission: RE | Admit: 2020-08-31 | Discharge: 2020-08-31 | Disposition: A | Discharge status: Home / Self Care | Payer: Medicare Other | Source: Ambulatory Visit | Attending: Pulmonary Disease | Admitting: Pulmonary Disease

## 2020-08-31 DIAGNOSIS — R54 Age-related physical debility: Secondary | ICD-10-CM | POA: Diagnosis present

## 2020-08-31 DIAGNOSIS — Z23 Encounter for immunization: Secondary | ICD-10-CM | POA: Insufficient documentation

## 2020-08-31 DIAGNOSIS — U071 COVID-19: Secondary | ICD-10-CM | POA: Diagnosis present

## 2020-08-31 MED ORDER — SODIUM CHLORIDE 0.9 % IV SOLN: INTRAVENOUS | Status: DC | PRN

## 2020-08-31 MED ORDER — SODIUM CHLORIDE 0.9 % IV SOLN
1200.0000 mg | Freq: Once | INTRAVENOUS | Status: AC
  Administered 2020-08-31: 1200 mg via INTRAVENOUS
  Filled 2020-08-31: qty 10

## 2020-08-31 MED ORDER — FAMOTIDINE IN NACL 20-0.9 MG/50ML-% IV SOLN: 20.0000 mg | Freq: Once | INTRAVENOUS | Status: DC | PRN

## 2020-08-31 MED ORDER — METHYLPREDNISOLONE SODIUM SUCC 125 MG IJ SOLR: 125.0000 mg | Freq: Once | INTRAMUSCULAR | Status: DC | PRN

## 2020-08-31 MED ORDER — EPINEPHRINE 0.3 MG/0.3ML IJ SOAJ: 0.3000 mg | Freq: Once | INTRAMUSCULAR | Status: DC | PRN

## 2020-08-31 MED ORDER — DIPHENHYDRAMINE HCL 50 MG/ML IJ SOLN: 50.0000 mg | Freq: Once | INTRAMUSCULAR | Status: DC | PRN

## 2020-08-31 MED ORDER — ALBUTEROL SULFATE HFA 108 (90 BASE) MCG/ACT IN AERS: 2.0000 | INHALATION_SPRAY | Freq: Once | RESPIRATORY_TRACT | Status: DC | PRN

## 2020-08-31 NOTE — Progress Notes (Addendum)
  Diagnosis: COVID-19  Physician: Dr. Patrick Wright  Procedure: [facility] Infusion [facility] Med: casirivimab\imdevimab infusion - Provided patient with casirivimab\imdevimab fact sheet for patients, parents and caregivers prior to infusion.  Complications: No immediate complications noted.  Discharge: Discharged home   Andrea Heath 08/31/2020   

## 2020-08-31 NOTE — Discharge Instructions (Signed)

## 2020-09-05 ENCOUNTER — Other Ambulatory Visit: Payer: Self-pay | Admitting: Rheumatology

## 2020-09-05 MED ORDER — COLCHICINE 0.6 MG PO TABS
0.6000 mg | ORAL_TABLET | Freq: Every day | ORAL | 0 refills | Status: DC
Start: 2020-09-05 — End: 2020-09-11

## 2020-09-05 NOTE — Telephone Encounter (Signed)
Last Visit: 08/08/2020 Next Visit: 02/08/2021 Labs: Uric acid is in desirable range. Mild anemia noted. GFR is low but is stable.  Current Dose per office note 08/08/2020 : colchicine 0.6 mg daily prn.  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 (Prescription in folder to sign)

## 2020-09-05 NOTE — Telephone Encounter (Signed)
Patient called requesting prescription refill of Colcrys to be sent to [facility] pharmacy.

## 2020-09-05 NOTE — Telephone Encounter (Signed)
Please clarify if she is taking it daily or as needed.

## 2020-09-05 NOTE — Telephone Encounter (Signed)
Patient she is taking it on a daily basis.

## 2020-09-05 NOTE — Telephone Encounter (Signed)
Ok to refill 

## 2020-09-11 ENCOUNTER — Telehealth: Payer: Self-pay | Admitting: Rheumatology

## 2020-09-11 MED ORDER — COLCHICINE 0.6 MG PO TABS: 0.6000 mg | ORAL_TABLET | Freq: Every day | ORAL | 0 refills | Status: DC

## 2020-09-11 NOTE — Telephone Encounter (Signed)
Patient advised we have faxed the refill to [facility]. Patient states she will call them.

## 2020-09-11 NOTE — Telephone Encounter (Signed)
Prescription refaxed.

## 2020-09-11 NOTE — Telephone Encounter (Signed)
Patient called checking on her prescription refill of Colcrys that she receives through the "special program."  Patient requested a return call.

## 2020-09-11 NOTE — Telephone Encounter (Signed)
Darlene from [facility] left a voicemail stating patient is out of refills for Colcrys and we do need a new prescription.  The prescription we received is invalid.  Please fax prescription to #[phone]   If you have any questions, please call #[phone]

## 2020-09-12 ENCOUNTER — Telehealth: Payer: Self-pay | Admitting: Rheumatology

## 2020-09-12 NOTE — Telephone Encounter (Signed)
Opened in error

## 2020-09-19 DIAGNOSIS — E1169 Type 2 diabetes mellitus with other specified complication: Secondary | ICD-10-CM | POA: Diagnosis not present

## 2020-09-19 DIAGNOSIS — E039 Hypothyroidism, unspecified: Secondary | ICD-10-CM | POA: Diagnosis not present

## 2020-09-19 DIAGNOSIS — E785 Hyperlipidemia, unspecified: Secondary | ICD-10-CM | POA: Diagnosis not present

## 2020-09-19 DIAGNOSIS — I10 Essential (primary) hypertension: Secondary | ICD-10-CM | POA: Diagnosis not present

## 2020-09-19 DIAGNOSIS — E7800 Pure hypercholesterolemia, unspecified: Secondary | ICD-10-CM | POA: Diagnosis not present

## 2020-09-26 DIAGNOSIS — R69 Illness, unspecified: Secondary | ICD-10-CM | POA: Diagnosis not present

## 2020-09-29 DIAGNOSIS — R69 Illness, unspecified: Secondary | ICD-10-CM | POA: Diagnosis not present

## 2020-10-09 ENCOUNTER — Other Ambulatory Visit (HOSPITAL_COMMUNITY): Payer: Medicare HMO

## 2020-10-12 ENCOUNTER — Ambulatory Visit (HOSPITAL_COMMUNITY): Payer: Medicare HMO

## 2020-10-12 ENCOUNTER — Encounter (HOSPITAL_COMMUNITY): Payer: Medicare HMO

## 2020-10-12 ENCOUNTER — Encounter (HOSPITAL_COMMUNITY): Payer: Medicare HMO | Admitting: Internal Medicine

## 2020-11-06 DIAGNOSIS — E1169 Type 2 diabetes mellitus with other specified complication: Secondary | ICD-10-CM | POA: Diagnosis not present

## 2020-11-06 DIAGNOSIS — E039 Hypothyroidism, unspecified: Secondary | ICD-10-CM | POA: Diagnosis not present

## 2020-11-06 DIAGNOSIS — K219 Gastro-esophageal reflux disease without esophagitis: Secondary | ICD-10-CM | POA: Diagnosis not present

## 2020-11-06 DIAGNOSIS — E7800 Pure hypercholesterolemia, unspecified: Secondary | ICD-10-CM | POA: Diagnosis not present

## 2020-11-06 DIAGNOSIS — I10 Essential (primary) hypertension: Secondary | ICD-10-CM | POA: Diagnosis not present

## 2020-11-06 DIAGNOSIS — E785 Hyperlipidemia, unspecified: Secondary | ICD-10-CM | POA: Diagnosis not present

## 2020-11-28 ENCOUNTER — Other Ambulatory Visit: Payer: Medicare HMO

## 2020-12-25 ENCOUNTER — Telehealth: Payer: Self-pay

## 2020-12-25 ENCOUNTER — Other Ambulatory Visit: Payer: Self-pay | Admitting: *Deleted

## 2020-12-25 MED ORDER — COLCHICINE 0.6 MG PO TABS: 0.6000 mg | ORAL_TABLET | Freq: Every day | ORAL | 0 refills | Status: DC

## 2020-12-25 MED ORDER — COLCHICINE 0.6 MG PO TABS
0.6000 mg | ORAL_TABLET | Freq: Every day | ORAL | 0 refills | Status: DC
Start: 2020-12-25 — End: 2020-12-25

## 2020-12-25 NOTE — Telephone Encounter (Signed)
Last refill: 09/11/2020 Last OV: 08/08/2020 Next OV: 02/08/2021 Uric Acid: 3.9 on 08/08/2020

## 2020-12-25 NOTE — Telephone Encounter (Signed)
Patient called requesting prescription refill of Colchicine to be sent to [facility].  Patient states she only has 3 pills remaining and was hoping to come in the office to pick up a sample.  Patient was informed that her refill won't be sent in until the end of the week since we don't have a provider in the office until Wednesday, 12/27/20.

## 2021-01-02 ENCOUNTER — Telehealth: Payer: Self-pay

## 2021-01-02 NOTE — Telephone Encounter (Addendum)
Medication Samples have been provided to the patient.  Drug name: Mitigare  Strength: 0.6 mg  Qty: 2 bottles LOT: [id number] Exp.Date: 01/2021  Dosing instructions: take one tablet by mouth daily as needed.   Jasmine December Benz Vandenberghe 3:01 PM 01/02/2021 B

## 2021-01-02 NOTE — Telephone Encounter (Addendum)
[employer] patient assistance application on my desk. Highlighted the portions that patient must complete when she comes by today to pick up application. Income documents on file from previous application. Once complete, application can be faxed to [facility].  Provider portion signed, income documents, insurance card copy, and med list all attached.  For future refills, can e-scribe to [facility]  Chesley Mires, PharmD, MPH Clinical Pharmacist (Rheumatology and Pulmonology)

## 2021-01-02 NOTE — Telephone Encounter (Signed)
Patient called stating she requested a prescription refill of her Colchicine on 12/25/20.  Patient states at that time she only had a couple of pills remaining.  Patient states she is now out of medication.  Patient is requesting the prescription be sent ASAP to  [facility] and requesting a return call to let her know it has been sent.

## 2021-01-02 NOTE — Telephone Encounter (Signed)
I attempted to contact [facility] pharmacy to check status of prescription and [facility] pharmacy opens at 10am.   Last Visit: 08/08/2020 Next Visit: 02/08/2021 Labs: 08/08/2020 Uric acid is in desirable range. Mild anemia noted. GFR is low but is stable.  Current Dose per office note on 08/08/2020: colchicine 0.6 mg daily prn. 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 2 samples have been reserved for the patient in the cabinet.   Devki called [facility] to ensure we could e-scribe the prescription and patient's enrollment ended on 12/29/2020. Renewal application will be provided to patient when she picks up samples. Patient is aware of the situation.

## 2021-01-03 NOTE — Telephone Encounter (Signed)
Submitted Patient Assistance Application to [facility] for Colcrys along with provider portion, signed patient portion, med list, and income documents. Will update patient when we receive a response.  Copy of application sent to scan center  Fax confirmation received that Raelene Bott is reviewing application.  Fax# [phone] Phone# [phone]

## 2021-01-17 DIAGNOSIS — E785 Hyperlipidemia, unspecified: Secondary | ICD-10-CM | POA: Diagnosis not present

## 2021-01-17 DIAGNOSIS — E7800 Pure hypercholesterolemia, unspecified: Secondary | ICD-10-CM | POA: Diagnosis not present

## 2021-01-17 DIAGNOSIS — K219 Gastro-esophageal reflux disease without esophagitis: Secondary | ICD-10-CM | POA: Diagnosis not present

## 2021-01-17 DIAGNOSIS — I10 Essential (primary) hypertension: Secondary | ICD-10-CM | POA: Diagnosis not present

## 2021-01-17 DIAGNOSIS — E1169 Type 2 diabetes mellitus with other specified complication: Secondary | ICD-10-CM | POA: Diagnosis not present

## 2021-01-17 DIAGNOSIS — E039 Hypothyroidism, unspecified: Secondary | ICD-10-CM | POA: Diagnosis not present

## 2021-01-23 NOTE — Telephone Encounter (Addendum)
Patient received a letter from  [employer] regarding an approval for Colcrys patient assistance through 12/29/21. Clinic didn't receive approval letter confirmation. Application already filed in media tab.  Patient already received 90-day supply in mail.

## 2021-01-25 NOTE — Progress Notes (Signed)
Office Visit Note  Patient: Andrea Heath             Date of Birth: [birth year]           MRN: [id number]             PCP: Clayborn Heron, MD Referring: Clayborn Heron, MD Visit Date: 02/08/2021 Occupation: @GUAROCC @  Subjective:  Raynauds phenomenon.   History of Present Illness: Andrea Heath is a 75 y.o. female with history of scleroderma, gout and osteoarthritis.  She states she continues to have Raynaud's symptoms.  She has not noticed any worsening of the skin tightness.  She states she did not go to the high-resolution CT appointment as she was concerned about COVID-19 infection.  She has not followed up with a cardiologist for the same reason.  She states she has been monitoring her blood pressure at home which has been normal.  She states she has anxiety of driving this reason blood pressure is elevated today.  Activities of Daily Living:  Patient reports morning stiffness for 0 minutes.   Patient Denies nocturnal pain.  Difficulty dressing/grooming: Denies Difficulty climbing stairs: Denies Difficulty getting out of chair: Denies Difficulty using hands for taps, buttons, cutlery, and/or writing: Reports  Review of Systems  Constitutional: Negative for fatigue, night sweats, weight gain and weight loss.  HENT: Negative for mouth sores, trouble swallowing, trouble swallowing, mouth dryness and nose dryness.   Eyes: Negative for pain, redness, itching, visual disturbance and dryness.  Respiratory: Positive for cough. Negative for hemoptysis, shortness of breath and difficulty breathing.        Allergies  Cardiovascular: Negative for chest pain, palpitations, hypertension, irregular heartbeat and swelling in legs/feet.  Gastrointestinal: Negative for abdominal pain, blood in stool, constipation and diarrhea.  Endocrine: Negative for increased urination.  Genitourinary: Negative for painful urination and vaginal dryness.  Musculoskeletal: Positive for  arthralgias and joint pain. Negative for joint swelling, myalgias, muscle weakness, morning stiffness, muscle tenderness and myalgias.  Skin: Positive for color change. Negative for rash, hair loss, redness, skin tightness, ulcers and sensitivity to sunlight.  Allergic/Immunologic: Negative for susceptible to infections.  Neurological: Positive for memory loss. Negative for dizziness, numbness, headaches, night sweats and weakness.  Hematological: Negative for swollen glands.  Psychiatric/Behavioral: Negative for depressed mood, confusion and sleep disturbance. The patient is not nervous/anxious.     PMFS History:  Patient Active Problem List   Diagnosis Date Noted  . 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  . 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 Chronic gout of foot 11/09/2016  . Age related osteoporosis 11/09/2016  . Hypothyroidism 11/09/2016  . Gastroesophageal reflux 11/09/2016  . Chest pain 01/19/2016  . DM type 2 (diabetes mellitus, type 2) (HCC) 01/19/2016  . Leg cramp 12/12/2014  . Pain, joint, multiple sites 09/08/2012  . Night sweats 06/04/2012  . Eyelid dermatitis, eczematous 04/30/2012  . General medical examination 12/03/2011  . URI 11/14/2010  . FOOT, PAIN 04/19/2010  . SINUSITIS 12/12/2009  . HYPERKALEMIA 11/27/2009  . SKIN LESION 07/27/2009  . LEG CRAMPS, NOCTURNAL 01/26/2009  . DEPRESSIVE DISORDER 10/20/2008  . SCLERODERMA 07/11/2008  . SEBORRHEIC KERATOSIS 03/08/2008  . RHINITIS 12/02/2007  . DIABETES MELLITUS, TYPE II 01/21/2007  . Dyslipidemia 01/21/2007  . Essential hypertension 01/21/2007    Past Medical History:  Diagnosis Date  . Diabetes mellitus   . GERD (gastroesophageal reflux disease)   . Gout   . Hyperlipidemia   . Hypertension   . Keratosis seborrheica   .  Macular degeneration of both eyes   . Osteoporosis   . Raynaud's disease   . Rhinitis   . Scleroderma (HCC)     Family History  Problem Relation Age of Onset  . Stroke Mother   .  Heart attack Mother   . Cancer Mother        breast cancer  . Breast cancer Mother 20  . Lung cancer Father   . Achalasia Brother   . Arrhythmia Brother   . Arrhythmia Brother   . Cancer Son 20       bladder cancer    Past Surgical History:  Procedure Laterality Date  . BREAST EXCISIONAL BIOPSY Left 2005  . CHOLECYSTECTOMY    . TUBAL LIGATION     Social History   Social History Narrative  . Not on file   Immunization History  Administered Date(s) Administered  . Influenza Split 10/10/2011, 09/08/2012  . Influenza Whole 10/27/2007, 09/22/2008, 09/28/2009, 08/29/2010  . Influenza,inj,Quad PF,6+ Mos 09/21/2013, 09/27/2014  . PFIZER(Purple Top)SARS-COV-2 Vaccination 02/11/2020, 03/04/2020, 12/04/2020  . Pneumococcal Polysaccharide-23 11/29/2010  . Td 06/24/2002, 12/13/2013  . Zoster 07/11/2008     Objective: Vital Signs: BP (!) 145/91 (BP Location: Left Arm, Patient Position: Sitting, Cuff Size: Normal)   Pulse 94   Ht 5\' 7"  (1.702 m)   Wt 168 lb 6.4 oz (76.4 kg)   BMI 26.38 kg/m    Physical Exam Vitals and nursing note reviewed.  Constitutional:      Appearance: She is well-developed and well-nourished.  HENT:     Head: Normocephalic and atraumatic.  Eyes:     Extraocular Movements: EOM normal.     Conjunctiva/sclera: Conjunctivae normal.  Cardiovascular:     Rate and Rhythm: Normal rate and regular rhythm.     Pulses: Intact distal pulses.     Heart sounds: Normal heart sounds.  Pulmonary:     Effort: Pulmonary effort is normal.     Breath sounds: Normal breath sounds.  Abdominal:     General: Bowel sounds are normal.     Palpations: Abdomen is soft.  Musculoskeletal:     Cervical back: Normal range of motion.  Lymphadenopathy:     Cervical: No cervical adenopathy.  Skin:    General: Skin is warm and dry.     Capillary Refill: Capillary refill takes 2 to 3 seconds.     Comments: Sclerodactyly, telangiectasias and nailbed capillary dropout and dilation  was noted.  Neurological:     Mental Status: She is alert and oriented to person, place, and time.  Psychiatric:        Mood and Affect: Mood and affect normal.        Behavior: Behavior normal.      Musculoskeletal Exam: C-spine was in good range of motion.  Shoulder joints, elbow joints, wrist joints, MCPs PIPs and DIPs with good range of motion.  Hip joints, knee joints with good range of motion.  She had no tenderness over MTPs.  CDAI Exam: CDAI Score: -- Patient Global: --; Provider Global: -- Swollen: --; Tender: -- Joint Exam 02/08/2021   No joint exam has been documented for this visit   There is currently no information documented on the homunculus. Go to the Rheumatology activity and complete the homunculus joint exam.  Investigation: No additional findings.  Imaging: No results found.  Recent Labs: Lab Results  Component Value Date   WBC 5.4 08/08/2020   HGB 11.2 (L) 08/08/2020   PLT 237 08/08/2020  NA 136 08/08/2020   K 4.6 08/08/2020   CL 104 08/08/2020   CO2 26 08/08/2020   GLUCOSE 131 (H) 08/08/2020   BUN 13 08/08/2020   CREATININE 1.01 (H) 08/08/2020   BILITOT 0.4 08/08/2020   ALKPHOS 54 06/25/2017   AST 19 08/08/2020   ALT 18 08/08/2020   PROT 6.9 08/08/2020   ALBUMIN 3.8 06/25/2017   CALCIUM 9.3 08/08/2020   GFRAA 63 08/08/2020    Speciality Comments: Dexa May 2019: T-score -2.1 left femoral neck Dexa July 2016: T-score -2.5 left femoral neck  Procedures:  No procedures performed Allergies: Patient has no known allergies.   Assessment / Plan:     Visit Diagnoses: SCLERODERMA - Limited systemic sclerosis, sclerodactyly, Raynauds, Telengectesia's, positive ANA centromere, positive RF, positive CCP.  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  She continues to have Raynaud's phenomenon.  No worsening of skin tightness was noted.  She was evaluated by Dr. Gala Romney in 2018 at the time echocardiogram did not show pulmonary hypertension.  She also had  PFTs at that time.  She did not follow-up with the cardiology.  I have advised her to schedule a follow-up appointment.  We also ordered high-resolution CT.  She states she will go this time.  I will also refer her to pulmonologist as she has been having chronic cough which she relates to allergies.  Raynaud's disease without gangrene-keeping the core temperature warm and wearing gloves was emphasized.  Idiopathic chronic gout of left foot without tophus - allopurinol 300 mg and colchicine 0.6 mg daily prn.  She has not had any gout flare since she has been on allopurinol.  We will check uric acid today.  Hyperuricemia - uric acid: 08/08/2020 3.9  Age-related osteoporosis without current pathological fracture - DEXA  04/30/2018 BMD: 0.750, T-score: -2.1. July 2016 T score -2.5 left femoral neck.  She discontinued Fosamax per recommendations of her PCP according to pt  Postural kyphosis of thoracic region  Trochanteric bursitis of both hips -she reports intermittent pain.  History of diabetes mellitus, type II  Seborrheic keratosis  History of hypertension-she is on amlodipine and ACE inhibitor's.  ACE inhibitor's are not advised for the treatment of hypertension in patients with scleroderma as they are reserved for renal hypertensive crisis.  I would like for her to discuss this further with Dr. Gala Romney so she can switch her antihypertensive medications.  She was advised to monitor blood pressure closely at home.  Orders: Orders Placed This Encounter  Procedures  . CT Chest High Resolution  . CBC with Differential/Platelet  . COMPLETE METABOLIC PANEL WITH GFR  . Uric acid  . Ambulatory referral to Pulmonology   No orders of the defined types were placed in this encounter.   Follow-Up Instructions: Return in about 6 months (around 08/08/2021) for Scleroderma.   Pollyann Savoy, MD  Note - This record has been created using Animal nutritionist.  Chart creation errors have been sought,  but may not always  have been located. Such creation errors do not reflect on  the standard of medical care.

## 2021-02-08 ENCOUNTER — Ambulatory Visit (INDEPENDENT_AMBULATORY_CARE_PROVIDER_SITE_OTHER): Payer: Medicare HMO | Admitting: Rheumatology

## 2021-02-08 ENCOUNTER — Encounter: Payer: Self-pay | Admitting: Rheumatology

## 2021-02-08 ENCOUNTER — Other Ambulatory Visit: Payer: Self-pay

## 2021-02-08 VITALS — BP 145/91 | HR 94 | Ht 67.0 in | Wt 168.4 lb

## 2021-02-08 DIAGNOSIS — M7061 Trochanteric bursitis, right hip: Secondary | ICD-10-CM | POA: Diagnosis not present

## 2021-02-08 DIAGNOSIS — M7062 Trochanteric bursitis, left hip: Secondary | ICD-10-CM

## 2021-02-08 DIAGNOSIS — I7300 Raynaud's syndrome without gangrene: Secondary | ICD-10-CM

## 2021-02-08 DIAGNOSIS — M1A0720 Idiopathic chronic gout, left ankle and foot, without tophus (tophi): Secondary | ICD-10-CM | POA: Diagnosis not present

## 2021-02-08 DIAGNOSIS — M4004 Postural kyphosis, thoracic region: Secondary | ICD-10-CM

## 2021-02-08 DIAGNOSIS — E790 Hyperuricemia without signs of inflammatory arthritis and tophaceous disease: Secondary | ICD-10-CM

## 2021-02-08 DIAGNOSIS — Z8679 Personal history of other diseases of the circulatory system: Secondary | ICD-10-CM | POA: Diagnosis not present

## 2021-02-08 DIAGNOSIS — M349 Systemic sclerosis, unspecified: Secondary | ICD-10-CM

## 2021-02-08 DIAGNOSIS — L821 Other seborrheic keratosis: Secondary | ICD-10-CM | POA: Diagnosis not present

## 2021-02-08 DIAGNOSIS — Z8639 Personal history of other endocrine, nutritional and metabolic disease: Secondary | ICD-10-CM | POA: Diagnosis not present

## 2021-02-08 DIAGNOSIS — M810 Age-related osteoporosis without current pathological fracture: Secondary | ICD-10-CM | POA: Diagnosis not present

## 2021-02-08 NOTE — Patient Instructions (Signed)
Please a schedule appointment with Dr. Gala Romney   We will schedule high-resolution CT

## 2021-02-09 LAB — COMPLETE METABOLIC PANEL WITH GFR
AG Ratio: 1.5 (calc) (ref 1.0–2.5)
ALT: 20 U/L (ref 6–29)
AST: 20 U/L (ref 10–35)
Albumin: 4.3 g/dL (ref 3.6–5.1)
Alkaline phosphatase (APISO): 83 U/L (ref 37–153)
BUN/Creatinine Ratio: 17 (calc) (ref 6–22)
BUN: 18 mg/dL (ref 7–25)
CO2: 25 mmol/L (ref 20–32)
Calcium: 9.9 mg/dL (ref 8.6–10.4)
Chloride: 105 mmol/L (ref 98–110)
Creat: 1.06 mg/dL — ABNORMAL HIGH (ref 0.60–0.93)
GFR, Est African American: 59 mL/min/{1.73_m2} — ABNORMAL LOW (ref >=60)
GFR, Est Non African American: 51 mL/min/{1.73_m2} — ABNORMAL LOW (ref >=60)
Globulin: 2.9 g/dL (calc) (ref 1.9–3.7)
Glucose, Bld: 171 mg/dL — ABNORMAL HIGH (ref 65–99)
Potassium: 4.9 mmol/L (ref 3.5–5.3)
Sodium: 138 mmol/L (ref 135–146)
Total Bilirubin: 0.5 mg/dL (ref 0.2–1.2)
Total Protein: 7.2 g/dL (ref 6.1–8.1)

## 2021-02-09 LAB — CBC WITH DIFFERENTIAL/PLATELET
Absolute Monocytes: 479 cells/uL (ref 200–950)
Basophils Absolute: 22 cells/uL (ref 0–200)
Basophils Relative: 0.4 %
Eosinophils Absolute: 110 cells/uL (ref 15–500)
Eosinophils Relative: 2 %
HCT: 36.6 % (ref 35.0–45.0)
Hemoglobin: 11.7 g/dL (ref 11.7–15.5)
Lymphs Abs: 1799 cells/uL (ref 850–3900)
MCH: 26.4 pg — ABNORMAL LOW (ref 27.0–33.0)
MCHC: 32 g/dL (ref 32.0–36.0)
MCV: 82.4 fL (ref 80.0–100.0)
MPV: 12.6 fL — ABNORMAL HIGH (ref 7.5–12.5)
Monocytes Relative: 8.7 %
Neutro Abs: 3091 cells/uL (ref 1500–7800)
Neutrophils Relative %: 56.2 %
Platelets: 234 10*3/uL (ref 140–400)
RBC: 4.44 10*6/uL (ref 3.80–5.10)
RDW: 15.2 % — ABNORMAL HIGH (ref 11.0–15.0)
Total Lymphocyte: 32.7 %
WBC: 5.5 10*3/uL (ref 3.8–10.8)

## 2021-02-09 LAB — URIC ACID: Uric Acid, Serum: 3.7 mg/dL (ref 2.5–7.0)

## 2021-02-09 NOTE — Progress Notes (Signed)
CBC is stable.  Glucose is elevated.  Creatinine is higher and stable.  Uric acid is in desirable range.  Please forward results to her PCP.

## 2021-02-10 ENCOUNTER — Other Ambulatory Visit: Payer: Self-pay

## 2021-02-10 ENCOUNTER — Observation Stay (HOSPITAL_COMMUNITY)
Admission: EM | Admit: 2021-02-10 | Discharge: 2021-02-11 | Disposition: A | Discharge status: Home / Self Care | Payer: Medicare HMO | Attending: Internal Medicine | Admitting: Internal Medicine

## 2021-02-10 ENCOUNTER — Encounter (HOSPITAL_COMMUNITY): Payer: Self-pay | Admitting: Emergency Medicine

## 2021-02-10 ENCOUNTER — Observation Stay (HOSPITAL_BASED_OUTPATIENT_CLINIC_OR_DEPARTMENT_OTHER): Payer: Medicare HMO

## 2021-02-10 ENCOUNTER — Emergency Department (HOSPITAL_COMMUNITY): Payer: Medicare HMO

## 2021-02-10 DIAGNOSIS — E039 Hypothyroidism, unspecified: Secondary | ICD-10-CM | POA: Diagnosis not present

## 2021-02-10 DIAGNOSIS — Z20822 Contact with and (suspected) exposure to covid-19: Secondary | ICD-10-CM | POA: Diagnosis not present

## 2021-02-10 DIAGNOSIS — I7300 Raynaud's syndrome without gangrene: Secondary | ICD-10-CM | POA: Insufficient documentation

## 2021-02-10 DIAGNOSIS — E1165 Type 2 diabetes mellitus with hyperglycemia: Secondary | ICD-10-CM | POA: Insufficient documentation

## 2021-02-10 DIAGNOSIS — E785 Hyperlipidemia, unspecified: Secondary | ICD-10-CM | POA: Diagnosis not present

## 2021-02-10 DIAGNOSIS — Z7984 Long term (current) use of oral hypoglycemic drugs: Secondary | ICD-10-CM | POA: Diagnosis not present

## 2021-02-10 DIAGNOSIS — Z79899 Other long term (current) drug therapy: Secondary | ICD-10-CM | POA: Insufficient documentation

## 2021-02-10 DIAGNOSIS — R0789 Other chest pain: Principal | ICD-10-CM | POA: Insufficient documentation

## 2021-02-10 DIAGNOSIS — R079 Chest pain, unspecified: Secondary | ICD-10-CM

## 2021-02-10 DIAGNOSIS — R0602 Shortness of breath: Secondary | ICD-10-CM | POA: Diagnosis not present

## 2021-02-10 DIAGNOSIS — I10 Essential (primary) hypertension: Secondary | ICD-10-CM | POA: Diagnosis not present

## 2021-02-10 LAB — BASIC METABOLIC PANEL
Anion gap: 12 (ref 5–15)
BUN: 17 mg/dL (ref 8–23)
CO2: 22 mmol/L (ref 22–32)
Calcium: 9.1 mg/dL (ref 8.9–10.3)
Chloride: 103 mmol/L (ref 98–111)
Creatinine, Ser: 1.14 mg/dL — ABNORMAL HIGH (ref 0.44–1.00)
GFR, Estimated: 50 mL/min — ABNORMAL LOW (ref >60)
Glucose, Bld: 190 mg/dL — ABNORMAL HIGH (ref 70–99)
Potassium: 4.3 mmol/L (ref 3.5–5.1)
Sodium: 137 mmol/L (ref 135–145)

## 2021-02-10 LAB — RESP PANEL BY RT-PCR (FLU A&B, COVID) ARPGX2
Influenza A by PCR: NEGATIVE
Influenza B by PCR: NEGATIVE
SARS Coronavirus 2 by RT PCR: NEGATIVE

## 2021-02-10 LAB — HEMOGLOBIN A1C
Hgb A1c MFr Bld: 7.6 % — ABNORMAL HIGH (ref 4.8–5.6)
Mean Plasma Glucose: 171.42 mg/dL

## 2021-02-10 LAB — ECHOCARDIOGRAM COMPLETE
Area-P 1/2: 2.95 cm2
Calc EF: 74.5 %
MV VTI: 1.54 cm2
S' Lateral: 1.8 cm
Single Plane A2C EF: 79.6 %
Single Plane A4C EF: 68.9 %

## 2021-02-10 LAB — CBC
HCT: 35.6 % — ABNORMAL LOW (ref 36.0–46.0)
Hemoglobin: 11.5 g/dL — ABNORMAL LOW (ref 12.0–15.0)
MCH: 26.6 pg (ref 26.0–34.0)
MCHC: 32.3 g/dL (ref 30.0–36.0)
MCV: 82.2 fL (ref 80.0–100.0)
Platelets: 234 10*3/uL (ref 150–400)
RBC: 4.33 MIL/uL (ref 3.87–5.11)
RDW: 15.7 % — ABNORMAL HIGH (ref 11.5–15.5)
WBC: 4.8 10*3/uL (ref 4.0–10.5)
nRBC: 0 % (ref 0.0–0.2)

## 2021-02-10 LAB — TROPONIN I (HIGH SENSITIVITY)
Troponin I (High Sensitivity): 7 ng/L (ref <18)
Troponin I (High Sensitivity): 6 ng/L (ref <18)

## 2021-02-10 LAB — CBG MONITORING, ED: Glucose-Capillary: 116 mg/dL — ABNORMAL HIGH (ref 70–99)

## 2021-02-10 LAB — GLUCOSE, CAPILLARY
Glucose-Capillary: 129 mg/dL — ABNORMAL HIGH (ref 70–99)
Glucose-Capillary: 122 mg/dL — ABNORMAL HIGH (ref 70–99)

## 2021-02-10 LAB — D-DIMER, QUANTITATIVE: D-Dimer, Quant: <0.27 ug/mL-FEU (ref 0.00–0.50)

## 2021-02-10 MED ORDER — ASPIRIN 325 MG PO TABS
325.0000 mg | ORAL_TABLET | Freq: Every day | ORAL | Status: DC
  Administered 2021-02-11: 325 mg via ORAL
  Filled 2021-02-10: qty 1

## 2021-02-10 MED ORDER — PANTOPRAZOLE SODIUM 40 MG PO TBEC
40.0000 mg | DELAYED_RELEASE_TABLET | Freq: Every day | ORAL | Status: DC
  Administered 2021-02-11: 40 mg via ORAL
  Filled 2021-02-10: qty 1

## 2021-02-10 MED ORDER — LISINOPRIL 10 MG PO TABS: 10.0000 mg | ORAL_TABLET | Freq: Every day | ORAL | Status: DC

## 2021-02-10 MED ORDER — ACETAMINOPHEN 325 MG PO TABS: 650.0000 mg | ORAL_TABLET | ORAL | Status: DC | PRN

## 2021-02-10 MED ORDER — AMLODIPINE BESYLATE 5 MG PO TABS: 5.0000 mg | ORAL_TABLET | Freq: Every day | ORAL | Status: DC

## 2021-02-10 MED ORDER — ALLOPURINOL 300 MG PO TABS
300.0000 mg | ORAL_TABLET | Freq: Every day | ORAL | Status: DC
  Administered 2021-02-11: 300 mg via ORAL
  Filled 2021-02-10: qty 1

## 2021-02-10 MED ORDER — GLIPIZIDE ER 5 MG PO TB24
5.0000 mg | ORAL_TABLET | Freq: Every day | ORAL | Status: DC
  Filled 2021-02-10: qty 1

## 2021-02-10 MED ORDER — ENOXAPARIN SODIUM 40 MG/0.4ML ~~LOC~~ SOLN
40.0000 mg | SUBCUTANEOUS | Status: DC
  Administered 2021-02-10: 40 mg via SUBCUTANEOUS
  Filled 2021-02-10: qty 0.4

## 2021-02-10 MED ORDER — ONDANSETRON HCL 4 MG/2ML IJ SOLN: 4.0000 mg | Freq: Four times a day (QID) | INTRAMUSCULAR | Status: DC | PRN

## 2021-02-10 MED ORDER — VITAMIN B-12 1000 MCG PO TABS
5000.0000 ug | ORAL_TABLET | Freq: Every day | ORAL | Status: DC
  Administered 2021-02-11: 5000 ug via ORAL
  Filled 2021-02-10: qty 5

## 2021-02-10 MED ORDER — ATORVASTATIN CALCIUM 10 MG PO TABS
20.0000 mg | ORAL_TABLET | Freq: Every day | ORAL | Status: DC
  Administered 2021-02-11: 20 mg via ORAL
  Filled 2021-02-10: qty 2

## 2021-02-10 MED ORDER — FLUTICASONE PROPIONATE 50 MCG/ACT NA SUSP
2.0000 | Freq: Every day | NASAL | Status: DC
  Administered 2021-02-11: 2 via NASAL
  Filled 2021-02-10: qty 16

## 2021-02-10 MED ORDER — COLCHICINE 0.6 MG PO TABS: 0.6000 mg | ORAL_TABLET | Freq: Every day | ORAL | Status: DC

## 2021-02-10 MED ORDER — PROSIGHT PO TABS
1.0000 | ORAL_TABLET | Freq: Every day | ORAL | Status: DC
  Administered 2021-02-11: 1 via ORAL
  Filled 2021-02-10: qty 1

## 2021-02-10 MED ORDER — LEVOTHYROXINE SODIUM 75 MCG PO TABS
75.0000 ug | ORAL_TABLET | Freq: Every day | ORAL | Status: DC
  Administered 2021-02-11: 75 ug via ORAL
  Filled 2021-02-10: qty 1

## 2021-02-10 MED ORDER — HYDRALAZINE HCL 25 MG PO TABS: 25.0000 mg | ORAL_TABLET | Freq: Four times a day (QID) | ORAL | Status: DC | PRN

## 2021-02-10 MED ORDER — INSULIN ASPART 100 UNIT/ML ~~LOC~~ SOLN
0.0000 [IU] | Freq: Three times a day (TID) | SUBCUTANEOUS | Status: DC
  Administered 2021-02-10: 2 [IU] via SUBCUTANEOUS

## 2021-02-10 MED ORDER — TRIAMCINOLONE ACETONIDE 55 MCG/ACT NA AERO: 2.0000 | INHALATION_SPRAY | Freq: Every day | NASAL | Status: DC

## 2021-02-10 NOTE — ED Provider Notes (Signed)
[facility] EMERGENCY DEPARTMENT Provider Note   CSN: [id number] Arrival date & time: 02/10/21  0856     History Chief Complaint  Patient presents with  . Chest Pain    Andrea Heath is a 75 y.o. female.  The history is provided by the patient. No language interpreter was used.  Chest Pain Pain location:  L chest Pain quality: aching   Pain radiates to:  Does not radiate Pain severity:  Moderate Onset quality:  Gradual Duration:  2 days Timing:  Constant Chronicity:  New Relieved by:  Nothing Worsened by:  Nothing Ineffective treatments:  None tried Risk factors: diabetes mellitus, high cholesterol and hypertension   Pt reports she had an episode of the same last week and saw Dr. Erlene Senters . She was advised to see cardiology.  Pt reports she has not been seen yet.  Pt reports pain today concerned her and her husband made her come in         Past Medical History:  Diagnosis Date  . Diabetes mellitus   . GERD (gastroesophageal reflux disease)   . Gout   . Hyperlipidemia   . Hypertension   . Keratosis seborrheica   . Macular degeneration of both eyes   . Osteoporosis   . Raynaud's disease   . Rhinitis   . Scleroderma [facility])     Patient Active Problem List   Diagnosis Date Noted  . 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  . 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 Chronic gout of foot 11/09/2016  . Age related osteoporosis 11/09/2016  . Hypothyroidism 11/09/2016  . Gastroesophageal reflux 11/09/2016  . Chest pain 01/19/2016  . DM type 2 (diabetes mellitus, type 2) (HCC) 01/19/2016  . Leg cramp 12/12/2014  . Pain, joint, multiple sites 09/08/2012  . Night sweats 06/04/2012  . Eyelid dermatitis, eczematous 04/30/2012  . General medical examination 12/03/2011  . URI 11/14/2010  . FOOT, PAIN 04/19/2010  . SINUSITIS 12/12/2009  . HYPERKALEMIA 11/27/2009  . SKIN LESION 07/27/2009  . LEG CRAMPS, NOCTURNAL 01/26/2009  . DEPRESSIVE DISORDER  10/20/2008  . SCLERODERMA 07/11/2008  . SEBORRHEIC KERATOSIS 03/08/2008  . RHINITIS 12/02/2007  . DIABETES MELLITUS, TYPE II 01/21/2007  . Dyslipidemia 01/21/2007  . Essential hypertension 01/21/2007    Past Surgical History:  Procedure Laterality Date  . BREAST EXCISIONAL BIOPSY Left 2005  . CHOLECYSTECTOMY    . TUBAL LIGATION       OB History   No obstetric history on file.     Family History  Problem Relation Age of Onset  . Stroke Mother   . Heart attack Mother   . Cancer Mother        breast cancer  . Breast cancer Mother 58  . Lung cancer Father   . Achalasia Brother   . Arrhythmia Brother   . Arrhythmia Brother   . Cancer Son 20       bladder cancer     Social History   Tobacco Use  . Smoking status: Never Smoker  . Smokeless tobacco: Never Used  Vaping Use  . Vaping Use: Never used  Substance Use Topics  . Alcohol use: No  . Drug use: No    Home Medications Prior to Admission medications   Medication Sig Start Date End Date Taking? Authorizing Provider  allopurinol (ZYLOPRIM) 300 MG tablet TAKE ONE TABLET BY MOUTH ONE TIME DAILY  12/25/18   Pollyann Savoy, MD  amLODipine (NORVASC) 5 MG tablet daily. 09/30/19  [provider]  aspirin 325 MG tablet Take 325 mg by mouth daily.    [provider]  atorvastatin (LIPITOR) 20 MG tablet TAKE 1 TABLET BY MOUTH DAILY. 12/12/14   Sheliah Hatch, MD  calcium-vitamin D (OSCAL WITH D) 500-200 MG-UNIT tablet Take 1 tablet by mouth daily.    [provider]  cholecalciferol (VITAMIN D) 1000 units tablet Take 2,000 Units by mouth daily.     [provider]  colchicine (COLCRYS) 0.6 MG tablet Take 1 tablet (0.6 mg total) by mouth daily. 12/25/20   Pollyann Savoy, MD  Cyanocobalamin (VITAMIN B-12) 5000 MCG SUBL Place daily under the tongue.    [provider]  fluticasone (FLONASE) 50 MCG/ACT nasal spray USE 2 SPRAYS IN EACH NOSTRIL ONCE A DAY 11/14/14   Sheliah Hatch, MD  glipiZIDE (GLUCOTROL XL) 5 MG 24 hr tablet TAKE 1 TABLET BY MOUTH ONCE DAILY Patient taking differently: Take 7.5 mg by mouth daily. 01/24/15   Sheliah Hatch, MD  levothyroxine (SYNTHROID, LEVOTHROID) 75 MCG tablet TAKE 1 TABLET (75 MCG TOTAL) BY MOUTH DAILY. 12/26/14   Sheliah Hatch, MD  lisinopril (PRINIVIL,ZESTRIL) 5 MG tablet Take 1 tablet (5 mg total) by mouth daily. 11/22/14   Sheliah Hatch, MD  Lutein 20 MG CAPS Take 1 capsule by mouth daily.    [provider]  omeprazole (PRILOSEC) 20 MG capsule Take 2 capsules (40 mg total) by mouth daily. 01/21/16   Kathlen Mody, MD  triamcinolone (NASACORT) 55 MCG/ACT AERO nasal inhaler Place 2 sprays into the nose daily.    [provider]    Allergies    Patient has no known allergies.  Review of Systems   Review of Systems  Cardiovascular: Positive for chest pain.  All other systems reviewed and are negative.   Physical Exam Updated Vital Signs BP (!) 152/71   Pulse 66   Temp 98.2 F (36.8 C)   Resp 17   SpO2 100%   Physical Exam Vitals and nursing note reviewed.  Constitutional:      Appearance: She is well-developed and well-nourished.  HENT:     Head: Normocephalic.  Eyes:     Extraocular Movements: EOM normal.  Cardiovascular:     Rate and Rhythm: Normal rate and regular rhythm.     Heart sounds: Normal heart sounds.  Pulmonary:     Effort: Pulmonary effort is normal.     Breath sounds: Normal breath sounds.  Abdominal:     General: There is no distension.     Palpations: Abdomen is soft.  Musculoskeletal:        General: Normal range of motion.     Cervical back: Normal range of motion.  Skin:    General: Skin is warm.  Neurological:     General: No focal deficit present.     Mental Status: She is alert and oriented to person, place, and time.  Psychiatric:        Mood and Affect: Mood and affect and mood normal.     ED Results / Procedures / Treatments    Labs (all labs ordered are listed, but only abnormal results are displayed) Labs Reviewed  BASIC METABOLIC PANEL - Abnormal; Notable for the following components:      Result Value   Glucose, Bld 190 (*)    Creatinine, Ser 1.14 (*)    GFR, Estimated 50 (*)    All other components within normal limits  CBC -  Abnormal; Notable for the following components:   Hemoglobin 11.5 (*)    HCT 35.6 (*)    RDW 15.7 (*)    All other components within normal limits  RESP PANEL BY RT-PCR (FLU A&B, COVID) ARPGX2  TROPONIN I (HIGH SENSITIVITY)  TROPONIN I (HIGH SENSITIVITY)    EKG EKG Interpretation  Date/Time:  Saturday February 10 2021 09:04:24 EST Ventricular Rate:  88 PR Interval:  160 QRS Duration: 96 QT Interval:  402 QTC Calculation: 486 R Axis:   180 Text Interpretation: Normal sinus rhythm Right ventricular hypertrophy Abnormal ECG Confirmed by Lorre Nick ([id number]) on 02/10/2021 12:31:30 PM   Radiology DG Chest 2 View  Result Date: 02/10/2021 CLINICAL DATA:  Left-sided chest pain and shortness of breath. EXAM: CHEST - 2 VIEW COMPARISON:  Chest x-ray dated January 19, 2016. FINDINGS: The heart size and mediastinal contours are within normal limits. Both lungs are clear. The visualized skeletal structures are unremarkable. IMPRESSION: No active cardiopulmonary disease. Electronically Signed   By: Obie Dredge M.D.   On: 02/10/2021 09:22    Procedures Procedures   Medications Ordered in ED Medications - No data to display  ED Course  I have reviewed the triage vital signs and the nursing notes.  Pertinent labs & imaging results that were available during my care of the patient were reviewed by me and considered in my medical decision making (see chart for details).    MDM Rules/Calculators/A&P Heart score 6                           Troponin is normal x 2.  Ekg no acute abnormality   Final Clinical Impression(s) / ED Diagnoses Final diagnoses:  Chest pain,  unspecified type    Rx / DC Orders ED Discharge Orders    None       Osie Cheeks 02/10/21 1347    Lorre Nick, MD 02/13/21 [phone]

## 2021-02-10 NOTE — Progress Notes (Signed)
  Echocardiogram 2D Echocardiogram has been performed.  Janalyn Harder 02/10/2021, 3:13 PM

## 2021-02-10 NOTE — H&P (Addendum)
History and Physical    Andrea Heath ALP:[id number] DOB: 01/16/1946 DOA: 02/10/2021  PCP: Clayborn Heron, MD (Confirm with patient/family/NH records and if not entered, this has to be entered at [facility] point of entry) Patient coming from: Home  I have personally briefly reviewed patient's old medical records in [facility]  Chief Complaint: Chest pain  HPI: [name] is a 75 y.o. female with medical history significant of scleroderma not on disease modification medications due to poor tolerance, Raynaud's syndrome, gout, DM, HLD, IIDM, hypothyroidism, presented with new onset chest pain.  This morning, patient woke up and "feeling very upset" and then developed a left sided chest pain, lasted for about 2 hours, 7-8/10, localized and nonradiating, denied any associated symptoms such as shortness of breath, no palpitation no nausea vomiting. No exacerbation or relieving factors.  And she did not take any medication for headache after about 2 hours, chest pain subsided.  About 3 weeks ago, patient had a similar episode when she remembered she was emotional at that time too, quite similar feature, but lasted only about 30 minutes.  Patient denies any cough, no recent long distance travel.  She did have COVID infection in September 2021 and husband reported since patient has been running low-grade fever in the afternoons.  ED Course: Troponin negative x2, EKG showed chronic RBBB. Glu 190  Review of Systems: As per HPI otherwise 14 point review of systems negative.    Past Medical History:  Diagnosis Date  . Diabetes mellitus   . GERD (gastroesophageal reflux disease)   . Gout   . Hyperlipidemia   . Hypertension   . Keratosis seborrheica   . Macular degeneration of both eyes   . Osteoporosis   . Raynaud's disease   . Rhinitis   . Scleroderma [facility])     Past Surgical History:  Procedure Laterality Date  . BREAST EXCISIONAL BIOPSY Left 2005  . CHOLECYSTECTOMY    .  TUBAL LIGATION       reports that she has never smoked. She has never used smokeless tobacco. She reports that she does not drink alcohol and does not use drugs.  No Known Allergies  Family History  Problem Relation Age of Onset  . Stroke Mother   . Heart attack Mother   . Cancer Mother        breast cancer  . Breast cancer Mother 29  . Lung cancer Father   . Achalasia Brother   . Arrhythmia Brother   . Arrhythmia Brother   . Cancer Son 20       bladder cancer     Prior to Admission medications   Medication Sig Start Date End Date Taking? Authorizing Provider  allopurinol (ZYLOPRIM) 300 MG tablet TAKE ONE TABLET BY MOUTH ONE TIME DAILY  12/25/18   Pollyann Savoy, MD  amLODipine (NORVASC) 5 MG tablet daily. 09/30/19   [provider]  aspirin 325 MG tablet Take 325 mg by mouth daily.    [provider]  atorvastatin (LIPITOR) 20 MG tablet TAKE 1 TABLET BY MOUTH DAILY. 12/12/14   Sheliah Hatch, MD  calcium-vitamin D (OSCAL WITH D) 500-200 MG-UNIT tablet Take 1 tablet by mouth daily.    [provider]  cholecalciferol (VITAMIN D) 1000 units tablet Take 2,000 Units by mouth daily.     [provider]  colchicine (COLCRYS) 0.6 MG tablet Take 1 tablet (0.6 mg total) by mouth daily. 12/25/20   Pollyann Savoy, MD  Cyanocobalamin (VITAMIN B-12) 5000 MCG SUBL Place daily under the tongue.    [provider]  fluticasone (FLONASE) 50 MCG/ACT nasal spray USE 2 SPRAYS IN EACH NOSTRIL ONCE A DAY 11/14/14   Sheliah Hatch, MD  glipiZIDE (GLUCOTROL XL) 5 MG 24 hr tablet TAKE 1 TABLET BY MOUTH ONCE DAILY Patient taking differently: Take 7.5 mg by mouth daily. 01/24/15   Sheliah Hatch, MD  levothyroxine (SYNTHROID, LEVOTHROID) 75 MCG tablet TAKE 1 TABLET (75 MCG TOTAL) BY MOUTH DAILY. 12/26/14   Sheliah Hatch, MD  lisinopril (PRINIVIL,ZESTRIL) 5 MG tablet Take 1 tablet (5 mg total) by mouth daily. 11/22/14   Sheliah Hatch, MD  Lutein 20 MG CAPS Take 1 capsule by mouth daily.    [provider]  omeprazole (PRILOSEC) 20 MG capsule Take 2 capsules (40 mg total) by mouth daily. 01/21/16   Kathlen Mody, MD  triamcinolone (NASACORT) 55 MCG/ACT AERO nasal inhaler Place 2 sprays into the nose daily.    [provider]    Physical Exam: Vitals:   02/10/21 1300 02/10/21 1315 02/10/21 1330 02/10/21 1345  BP: 140/76 139/61 (!) 146/66 127/72  Pulse: 67 66 77 72  Resp: 18 13 16 15   Temp:      SpO2: 100% 100% 100% 100%    Constitutional: NAD, calm, comfortable Vitals:   02/10/21 1300 02/10/21 1315 02/10/21 1330 02/10/21 1345  BP: 140/76 139/61 (!) 146/66 127/72  Pulse: 67 66 77 72  Resp: 18 13 16 15   Temp:      SpO2: 100% 100% 100% 100%   Eyes: PERRL, lids and conjunctivae normal ENMT: Mucous membranes are moist. Posterior pharynx clear of any exudate or lesions.Normal dentition.  Neck: normal, supple, no masses, no thyromegaly Respiratory: clear to auscultation bilaterally, no wheezing, no crackles. Normal respiratory effort. No accessory muscle use.  Cardiovascular: Regular rate and rhythm, loud systolic murmur on heart base. No extremity edema. 2+ pedal pulses. No carotid bruits.  Abdomen: no tenderness, no masses palpated. No hepatosplenomegaly. Bowel sounds positive.  Musculoskeletal: no clubbing / cyanosis. No joint deformity upper and lower extremities. Good ROM, no contractures. Normal muscle tone.  Skin: no rashes, lesions, ulcers. No induration.  Capillary refill sluggish Neurologic: CN 2-12 grossly intact. Sensation intact, DTR normal. Strength 5/5 in all 4.  Psychiatric: Normal judgment and insight. Alert and oriented x 3. Normal mood.     Labs on Admission: I have personally reviewed following labs and imaging studies  CBC: Recent Labs  Lab 02/08/21 1111 02/10/21 0933  WBC 5.5 4.8  NEUTROABS 3,091  --   HGB 11.7 11.5*  HCT 36.6 35.6*  MCV 82.4 82.2  PLT 234 234    Basic Metabolic Panel: Recent Labs  Lab 02/08/21 1111 02/10/21 0933  NA 138 137  K 4.9 4.3  CL 105 103  CO2 25 22  GLUCOSE 171* 190*  BUN 18 17  CREATININE 1.06* 1.14*  CALCIUM 9.9 9.1   GFR: Estimated Creatinine Clearance: 45.4 mL/min (A) (by C-G formula based on SCr of 1.14 mg/dL (H)). Liver Function Tests: Recent Labs  Lab 02/08/21 1111  AST 20  ALT 20  BILITOT 0.5  PROT 7.2   No results for input(s): LIPASE, AMYLASE in the last 168 hours. No results for input(s): AMMONIA in the last 168 hours. Coagulation Profile: No results for input(s): INR, PROTIME in the last 168 hours. Cardiac Enzymes: No results for input(s): CKTOTAL, CKMB, CKMBINDEX, TROPONINI in the last 168 hours.  BNP (last 3 results) No results for input(s): PROBNP in the last 8760 hours. HbA1C: No results for input(s): HGBA1C in the last 72 hours. CBG: Recent Labs  Lab 02/10/21 1247  GLUCAP 116*   Lipid Profile: No results for input(s): CHOL, HDL, LDLCALC, TRIG, CHOLHDL, LDLDIRECT in the last 72 hours. Thyroid Function Tests: No results for input(s): TSH, T4TOTAL, FREET4, T3FREE, THYROIDAB in the last 72 hours. Anemia Panel: No results for input(s): VITAMINB12, FOLATE, FERRITIN, TIBC, IRON, RETICCTPCT in the last 72 hours. Urine analysis:    Component Value Date/Time   COLORURINE DARK YELLOW 06/22/2019 1002   APPEARANCEUR CLEAR 06/22/2019 1002   LABSPEC 1.026 06/22/2019 1002   PHURINE < OR = 5.0 06/22/2019 1002   GLUCOSEU NEGATIVE 06/22/2019 1002   HGBUR NEGATIVE 06/22/2019 1002   BILIRUBINUR NEGATIVE 06/25/2017 1151   KETONESUR TRACE (A) 06/22/2019 1002   PROTEINUR TRACE (A) 06/22/2019 1002   UROBILINOGEN 0.2 10/07/2012 1641   NITRITE NEGATIVE 06/22/2019 1002   LEUKOCYTESUR 1+ (A) 06/22/2019 1002    Radiological Exams on Admission: DG Chest 2 View  Result Date: 02/10/2021 CLINICAL DATA:  Left-sided chest pain and shortness of breath. EXAM: CHEST - 2 VIEW COMPARISON:  Chest x-ray  dated January 19, 2016. FINDINGS: The heart size and mediastinal contours are within normal limits. Both lungs are clear. The visualized skeletal structures are unremarkable. IMPRESSION: No active cardiopulmonary disease. Electronically Signed   By: Obie Dredge M.D.   On: 02/10/2021 09:22    EKG: Independently reviewed. Chronic RBBB and right axis deviation Assessment/Plan Active Problems:   Chest pain  (please populate well all problems here in Problem List. (For example, if patient is on BP meds at home and you resume or decide to hold them, it is a problem that needs to be her. Same for CAD, COPD, HLD and so on)  Atypical chest pain -ACS ruled out -Given the Hx of recurrent chest pain and new murmur?, will check TTE, if TTE, will expect patient discharged home and perform outpatient stress test. -Given history of recent Covid infection as well as persistent low-grade fever, will check D-dimer.  IIDM with hyperglycemia -Continue sulfonylurea -Check A1c, add PRN coverage.  Raynaud's syndrome -Continue amlodipine  HTN -Fairly controlled  HLD -On statin  Hypothyroidism -Continue Synthroid  DVT prophylaxis: Lovenox  code Status: Full Code Family Communication: Husband at bedside Disposition Plan: Expect less than 2 midnight hospital stay, once TTE resulted patient likely can be discharged home and follow-up with cardiologist for outpatient stress test. Consults called: None Admission status: Tele obs   Emeline General MD [facility] Pager [phone]  02/10/2021, 2:08 PM

## 2021-02-10 NOTE — ED Notes (Signed)
Echo in progress at bedside.

## 2021-02-10 NOTE — ED Triage Notes (Signed)
Pt woke up with L sided chest pain and SOB.  States pain has improved some.  Denies nausea and vomiting.

## 2021-02-11 DIAGNOSIS — R079 Chest pain, unspecified: Secondary | ICD-10-CM | POA: Diagnosis not present

## 2021-02-11 LAB — GLUCOSE, CAPILLARY
Glucose-Capillary: 147 mg/dL — ABNORMAL HIGH (ref 70–99)
Glucose-Capillary: 88 mg/dL (ref 70–99)

## 2021-02-11 NOTE — Discharge Summary (Signed)
Physician Discharge Summary  Andrea Heath YTK:[id number] DOB: [birth year] DOA: 02/10/2021  PCP: Andrea Heron, MD  Admit date: 02/10/2021 Discharge date: 02/11/2021  Admitted From: Home Disposition:  Home  Discharge Condition:Stable CODE STATUS:FULL Diet recommendation: Heart Healthy   Brief/Interim Summary:  HPI: Andrea Heath is a 75 y.o. female with medical history significant of scleroderma not on disease modification medications due to poor tolerance, Raynaud's syndrome, gout, DM, HLD, IIDM, hypothyroidism, presented with new onset chest pain.  This morning, patient woke up and "feeling very upset" and then developed a left sided chest pain, lasted for about 2 hours, 7-8/10, localized and nonradiating, denied any associated symptoms such as shortness of breath, no palpitation no nausea vomiting. No exacerbation or relieving factors.  And she did not take any medication for headache after about 2 hours, chest pain subsided.  About 3 weeks ago, patient had a similar episode when she remembered she was emotional at that time too, quite similar feature, but lasted only about 30 minutes.  Patient denies any cough, no recent long distance travel.  She did have COVID infection in September 2021 and husband reported since patient has been running low-grade fever in the afternoons.  ED Course: Troponin negative x2, EKG showed chronic RBBB. Glu 190  Hospital Course:  Hospital course remained stable.  Her chest pain resolved after admission.  Troponins were negative.  Echocardiogram showed ejection fraction of 60 - 65%, no wall motion abnormality.  She has history of GERD, most likely it was contributing to chest discomfort.  We will recommend to continue PPI at home.  She has a cardiology appointment on March 15.  I will recommend to keep that appointment, consider outpatient stress test. She is medically stable for discharge home today.  Following problems were addressed during  hospitalization:  Atypical chest pain -ACS ruled out -Mx as above  DM 2 with hyperglycemia -Continue sulfonylurea -A1C of 7.6.  I have recommended her to monitor her sugars at home and closely follow-up with her PCP.  Raynaud's syndrome -Follows with rheumatology  HTN -Fairly controlled  HLD -On statin  Hypothyroidism -Continue Synthroid    Discharge Diagnoses:  Active Problems:   Chest pain    Discharge Instructions  Discharge Instructions    Diet - low sodium heart healthy   Complete by: As directed    Discharge instructions   Complete by: As directed    1)Please follow-up with your PCP in a week 2)Continue your home medications.  Monitor blood sugars at home. 3)Follow up with cardiology as an outpatient for a stress test.   Increase activity slowly   Complete by: As directed      Allergies as of 02/11/2021      Reactions   Other Other (See Comments)   Seasonal allergies- Itchy eyes, nasal congestion, and runny nose      Medication List    STOP taking these medications   colchicine 0.6 MG tablet Commonly known as: Colcrys   omeprazole 20 MG capsule Commonly known as: PRILOSEC     TAKE these medications   allopurinol 300 MG tablet Commonly known as: ZYLOPRIM TAKE ONE TABLET BY MOUTH ONE TIME DAILY What changed: when to take this   aspirin 325 MG tablet Take 325 mg by mouth in the morning.   atorvastatin 20 MG tablet Commonly known as: LIPITOR TAKE 1 TABLET BY MOUTH DAILY. What changed: when to take this   CALCIUM PO Take 1 tablet by mouth in the morning.  fluticasone 50 MCG/ACT nasal spray Commonly known as: FLONASE USE 2 SPRAYS IN EACH NOSTRIL ONCE A DAY What changed: when to take this   glipiZIDE 5 MG 24 hr tablet Commonly known as: GLUCOTROL XL TAKE 1 TABLET BY MOUTH ONCE DAILY What changed:   how much to take  when to take this   levothyroxine 75 MCG tablet Commonly known as: SYNTHROID TAKE 1 TABLET (75 MCG TOTAL)  BY MOUTH DAILY. What changed: See the new instructions.   lisinopril 10 MG tablet Commonly known as: ZESTRIL Take 10-15 mg by mouth See admin instructions. Take 10 mg by mouth in the morning and increase to 15 mg during the Winter months What changed: Another medication with the same name was removed. Continue taking this medication, and follow the directions you see here.   Lutein 20 MG Caps Take 20 mg by mouth daily.   PriLOSEC OTC 20 MG tablet Generic drug: omeprazole Take 20 mg by mouth daily before breakfast.   Vitamin B-12 5000 MCG Subl Place 5,000 mcg under the tongue daily.   Vitamin D-3 25 MCG (1000 UT) Caps Take 1,000-2,000 Units by mouth in the morning.       Follow-up Information    Rankins, Andrea DanceVictoria R, MD. Schedule an appointment as soon as possible for a visit in 1 week(s).   Specialty: Family Medicine Contact information: [address] [place] [address] [phone]              Allergies  Allergen Reactions  . Other Other (See Comments)    Seasonal allergies- Itchy eyes, nasal congestion, and runny nose    Consultations:  None   Procedures/Studies: DG Chest 2 View  Result Date: 02/10/2021 CLINICAL DATA:  Left-sided chest pain and shortness of breath. EXAM: CHEST - 2 VIEW COMPARISON:  Chest x-ray dated January 19, 2016. FINDINGS: The heart size and mediastinal contours are within normal limits. Both lungs are clear. The visualized skeletal structures are unremarkable. IMPRESSION: No active cardiopulmonary disease. Electronically Signed   By: Andrea Heath M.D.   On: 02/10/2021 09:22   ECHOCARDIOGRAM COMPLETE  Result Date: 02/10/2021    ECHOCARDIOGRAM REPORT   Patient Name:   Andrea Heath Date of Exam: 02/10/2021 Medical Rec #:  [id number]        Height:       67.0 in Accession #:    [id number]       Weight:       168.4 lb Date of Birth:  08/14/1946         BSA:          1.880 m Patient Age:    74 years         BP:           127/80  mmHg Patient Gender: F                HR:           80 bpm. Exam Location:  Inpatient Procedure: 2D Echo, 3D Echo, Color Doppler and Cardiac Doppler Indications:    R07.9* Chest pain, unspecified  History:        Patient has prior history of Echocardiogram examinations, most                 recent 07/01/2017. Risk Factors:Hypertension, Dyslipidemia and                 Diabetes. Scleroderma.  Sonographer:    Sheralyn Boatmanina West RDCS Referring Phys:  [id number] PING T Chipper Herb  Sonographer Comments: Technically difficult study due to poor echo windows. IMPRESSIONS  1. Left ventricular ejection fraction, by estimation, is 60 to 65%. The left ventricle has normal function. The left ventricle has no regional wall motion abnormalities. There is mild concentric left ventricular hypertrophy. Left ventricular diastolic parameters are indeterminate.  2. Right ventricular systolic function is normal. The right ventricular size is normal.  3. The mitral valve is abnormal. No evidence of mitral valve regurgitation. The mean mitral valve gradient is 4.0 mmHg with average heart rate of 82 bpm. Moderate to severe mitral annular calcification.  4. The aortic valve is tricuspid. There is mild calcification of the aortic valve. Aortic valve regurgitation is not visualized. Mild aortic valve sclerosis is present, with no evidence of aortic valve stenosis. Comparison(s): A prior study was performed on 07/01/2017. Prior images reviewed side by side. Slight increase in aortic valve and mitral valve calcification; without significant increase in gradients. FINDINGS  Left Ventricle: Left ventricular ejection fraction, by estimation, is 60 to 65%. The left ventricle has normal function. The left ventricle has no regional wall motion abnormalities. The left ventricular internal cavity size was small. There is mild concentric left ventricular hypertrophy. Left ventricular diastolic parameters are indeterminate. Right Ventricle: The right ventricular size is  normal. No increase in right ventricular wall thickness. Right ventricular systolic function is normal. Left Atrium: Left atrial size was normal in size. Right Atrium: Right atrial size was normal in size. Pericardium: There is no evidence of pericardial effusion. Mitral Valve: The mitral valve is abnormal. There is moderate thickening of the mitral valve leaflet(s). Moderate to severe mitral annular calcification. No evidence of mitral valve regurgitation. MV peak gradient, 16.0 mmHg. The mean mitral valve gradient is 4.0 mmHg with average heart rate of 82 bpm. Tricuspid Valve: The tricuspid valve is normal in structure. Tricuspid valve regurgitation is trivial. Aortic Valve: The aortic valve is tricuspid. There is mild calcification of the aortic valve. There is mild aortic valve annular calcification. Aortic valve regurgitation is not visualized. Mild aortic valve sclerosis is present, with no evidence of aortic valve stenosis. Pulmonic Valve: The pulmonic valve was not well visualized. Pulmonic valve regurgitation is not visualized. No evidence of pulmonic stenosis. Aorta: The aortic root and ascending aorta are structurally normal, with no evidence of dilitation. Venous: The inferior vena cava was not well visualized. IAS/Shunts: The atrial septum is grossly normal.  LEFT VENTRICLE PLAX 2D LVIDd:         2.80 cm     Diastology LVIDs:         1.80 cm     LV e' medial:    3.15 cm/s LV PW:         1.60 cm     LV E/e' medial:  17.6 LV IVS:        1.40 cm     LV e' lateral:   7.51 cm/s LVOT diam:     1.90 cm     LV E/e' lateral: 7.4 LV SV:         54 LV SV Index:   29 LVOT Area:     2.84 cm  LV Volumes (MOD) LV vol d, MOD A2C: 58.2 ml LV vol d, MOD A4C: 60.4 ml LV vol s, MOD A2C: 11.9 ml LV vol s, MOD A4C: 18.8 ml LV SV MOD A2C:     46.3 ml LV SV MOD A4C:     60.4 ml LV SV  MOD BP:      44.0 ml RIGHT VENTRICLE             IVC RV S prime:     14.40 cm/s  IVC diam: 1.60 cm TAPSE (M-mode): 1.6 cm LEFT ATRIUM              Index       RIGHT ATRIUM           Index LA diam:        3.10 cm 1.65 cm/m  RA Area:     10.00 cm LA Vol (A2C):   59.3 ml 31.55 ml/m RA Volume:   20.50 ml  10.91 ml/m LA Vol (A4C):   45.4 ml 24.15 ml/m LA Biplane Vol: 52.6 ml 27.98 ml/m  AORTIC VALVE LVOT Vmax:   121.00 cm/s LVOT Vmean:  71.100 cm/s LVOT VTI:    0.192 m  AORTA Ao Root diam: 2.90 cm Ao Asc diam:  2.90 cm MITRAL VALVE MV Area (PHT): 2.95 cm     SHUNTS MV Area VTI:   1.54 cm     Systemic VTI:  0.19 m MV Peak grad:  16.0 mmHg    Systemic Diam: 1.90 cm MV Mean grad:  4.0 mmHg MV Vmax:       2.00 m/s MV Vmean:      81.8 cm/s MV Decel Time: 257 msec MV E velocity: 55.47 cm/s MV A velocity: 172.00 cm/s MV E/A ratio:  0.32 Riley Lam MD Electronically signed by Riley Lam MD Signature Date/Time: 02/10/2021/4:58:34 PM    Final       Subjective: Patient seen and examined at the bedside this morning.  Hemodynamically stable for discharge today.  Discharge Exam: Vitals:   02/10/21 2025 02/11/21 0621  BP: (!) 147/69 132/87  Pulse: 96 86  Resp: 20   Temp: 97.9 F (36.6 C) 97.7 F (36.5 C)  SpO2: 98% 100%   Vitals:   02/10/21 1605 02/10/21 2025 02/11/21 0621 02/11/21 0630  BP:  (!) 147/69 132/87   Pulse:  96 86   Resp:  20    Temp:  97.9 F (36.6 C) 97.7 F (36.5 C)   TempSrc:  Oral Oral   SpO2:  98% 100%   Weight: 74.4 kg   73.2 kg  Height:  (1.702 m)       General: Pt is alert, awake, not in acute distress Cardiovascular: RRR, S1/S2 +, no rubs, no gallops Respiratory: CTA bilaterally, no wheezing, no rhonchi Abdominal: Soft, NT, ND, bowel sounds + Extremities: no edema, no cyanosis    The results of significant diagnostics from this hospitalization (including imaging, microbiology, ancillary and laboratory) are listed below for reference.     Microbiology: Recent Results (from the past 240 hour(s))  Resp Panel by RT-PCR (Flu A&B, Covid) Nasopharyngeal Swab     Status: None    Collection Time: 02/10/21 10:20 AM   Specimen: Nasopharyngeal Swab; Nasopharyngeal(NP) swabs in vial transport medium  Result Value Ref Range Status   SARS Coronavirus 2 by RT PCR NEGATIVE NEGATIVE Final    Comment: (NOTE) SARS-CoV-2 target nucleic acids are NOT DETECTED.  The SARS-CoV-2 RNA is generally detectable in upper respiratory specimens during the acute phase of infection. The lowest concentration of SARS-CoV-2 viral copies this assay can detect is 138 copies/mL. A negative result does not preclude SARS-Cov-2 infection and should not be used as the sole basis for treatment or other patient management decisions. A negative result may occur with  improper specimen collection/handling, submission of specimen other than nasopharyngeal swab, presence of viral mutation(s) within the areas targeted by this assay, and inadequate number of viral copies(<138 copies/mL). A negative result must be combined with clinical observations, patient history, and epidemiological information. The expected result is Negative.  Fact Sheet for Patients:  BloggerCourse.com  Fact Sheet for Healthcare Providers:  SeriousBroker.it  This test is no t yet approved or cleared by the [place] FDA and  has been authorized for detection and/or diagnosis of SARS-CoV-2 by FDA under an Emergency Use Authorization (EUA). This EUA will remain  in effect (meaning this test can be used) for the duration of the COVID-19 declaration under Section 564(b)(1) of the Act, 21 U.S.C.section 360bbb-3(b)(1), unless the authorization is terminated  or revoked sooner.       Influenza A by PCR NEGATIVE NEGATIVE Final   Influenza B by PCR NEGATIVE NEGATIVE Final    Comment: (NOTE) The Xpert Xpress SARS-CoV-2/FLU/RSV plus assay is intended as an aid in the diagnosis of influenza from Nasopharyngeal swab specimens and should not be used as a sole basis for treatment.  Nasal washings and aspirates are unacceptable for Xpert Xpress SARS-CoV-2/FLU/RSV testing.  Fact Sheet for Patients: BloggerCourse.com  Fact Sheet for Healthcare Providers: SeriousBroker.it  This test is not yet approved or cleared by the [place] FDA and has been authorized for detection and/or diagnosis of SARS-CoV-2 by FDA under an Emergency Use Authorization (EUA). This EUA will remain in effect (meaning this test can be used) for the duration of the COVID-19 declaration under Section 564(b)(1) of the Act, 21 U.S.C. section 360bbb-3(b)(1), unless the authorization is terminated or revoked.  Performed at [facility], [address], [place], [place] [address]      Labs: BNP (last 3 results) No results for input(s): BNP in the last 8760 hours. Basic Metabolic Panel: Recent Labs  Lab 02/08/21 1111 02/10/21 0933  NA 138 137  K 4.9 4.3  CL 105 103  CO2 25 22  GLUCOSE 171* 190*  BUN 18 17  CREATININE 1.06* 1.14*  CALCIUM 9.9 9.1   Liver Function Tests: Recent Labs  Lab 02/08/21 1111  AST 20  ALT 20  BILITOT 0.5  PROT 7.2   No results for input(s): LIPASE, AMYLASE in the last 168 hours. No results for input(s): AMMONIA in the last 168 hours. CBC: Recent Labs  Lab 02/08/21 1111 02/10/21 0933  WBC 5.5 4.8  NEUTROABS 3,091  --   HGB 11.7 11.5*  HCT 36.6 35.6*  MCV 82.4 82.2  PLT 234 234   Cardiac Enzymes: No results for input(s): CKTOTAL, CKMB, CKMBINDEX, TROPONINI in the last 168 hours. BNP: Invalid input(s): POCBNP CBG: Recent Labs  Lab 02/10/21 1247 02/10/21 1645 02/10/21 2022 02/11/21 0820  GLUCAP 116* 129* 122* 147*   D-Dimer Recent Labs    02/10/21 1635  DDIMER <0.27   Hgb A1c Recent Labs    02/10/21 1635  HGBA1C 7.6*   Lipid Profile No results for input(s): CHOL, HDL, LDLCALC, TRIG, CHOLHDL, LDLDIRECT in the last 72 hours. Thyroid function studies No results for  input(s): TSH, T4TOTAL, T3FREE, THYROIDAB in the last 72 hours.  Invalid input(s): FREET3 Anemia work up No results for input(s): VITAMINB12, FOLATE, FERRITIN, TIBC, IRON, RETICCTPCT in the last 72 hours. Urinalysis    Component Value Date/Time   COLORURINE DARK YELLOW 06/22/2019 1002   APPEARANCEUR CLEAR 06/22/2019 1002   LABSPEC 1.026 06/22/2019 1002   PHURINE < OR =  5.0 06/22/2019 1002   GLUCOSEU NEGATIVE 06/22/2019 1002   HGBUR NEGATIVE 06/22/2019 1002   BILIRUBINUR NEGATIVE 06/25/2017 1151   KETONESUR TRACE (A) 06/22/2019 1002   PROTEINUR TRACE (A) 06/22/2019 1002   UROBILINOGEN 0.2 10/07/2012 1641   NITRITE NEGATIVE 06/22/2019 1002   LEUKOCYTESUR 1+ (A) 06/22/2019 1002   Sepsis Labs Invalid input(s): PROCALCITONIN,  WBC,  LACTICIDVEN Microbiology Recent Results (from the past 240 hour(s))  Resp Panel by RT-PCR (Flu A&B, Covid) Nasopharyngeal Swab     Status: None   Collection Time: 02/10/21 10:20 AM   Specimen: Nasopharyngeal Swab; Nasopharyngeal(NP) swabs in vial transport medium  Result Value Ref Range Status   SARS Coronavirus 2 by RT PCR NEGATIVE NEGATIVE Final    Comment: (NOTE) SARS-CoV-2 target nucleic acids are NOT DETECTED.  The SARS-CoV-2 RNA is generally detectable in upper respiratory specimens during the acute phase of infection. The lowest concentration of SARS-CoV-2 viral copies this assay can detect is 138 copies/mL. A negative result does not preclude SARS-Cov-2 infection and should not be used as the sole basis for treatment or other patient management decisions. A negative result may occur with  improper specimen collection/handling, submission of specimen other than nasopharyngeal swab, presence of viral mutation(s) within the areas targeted by this assay, and inadequate number of viral copies(<138 copies/mL). A negative result must be combined with clinical observations, patient history, and epidemiological information. The expected result is  Negative.  Fact Sheet for Patients:  BloggerCourse.com  Fact Sheet for Healthcare Providers:  SeriousBroker.it  This test is no t yet approved or cleared by the [place] FDA and  has been authorized for detection and/or diagnosis of SARS-CoV-2 by FDA under an Emergency Use Authorization (EUA). This EUA will remain  in effect (meaning this test can be used) for the duration of the COVID-19 declaration under Section 564(b)(1) of the Act, 21 U.S.C.section 360bbb-3(b)(1), unless the authorization is terminated  or revoked sooner.       Influenza A by PCR NEGATIVE NEGATIVE Final   Influenza B by PCR NEGATIVE NEGATIVE Final    Comment: (NOTE) The Xpert Xpress SARS-CoV-2/FLU/RSV plus assay is intended as an aid in the diagnosis of influenza from Nasopharyngeal swab specimens and should not be used as a sole basis for treatment. Nasal washings and aspirates are unacceptable for Xpert Xpress SARS-CoV-2/FLU/RSV testing.  Fact Sheet for Patients: BloggerCourse.com  Fact Sheet for Healthcare Providers: SeriousBroker.it  This test is not yet approved or cleared by the [place] FDA and has been authorized for detection and/or diagnosis of SARS-CoV-2 by FDA under an Emergency Use Authorization (EUA). This EUA will remain in effect (meaning this test can be used) for the duration of the COVID-19 declaration under Section 564(b)(1) of the Act, 21 U.S.C. section 360bbb-3(b)(1), unless the authorization is terminated or revoked.  Performed at [facility], [address] [place], [place] [address]     Please note: You were cared for by a hospitalist during your hospital stay. Once you are discharged, your primary care physician will handle any further medical issues. Please note that NO REFILLS for any discharge medications will be authorized once you are discharged, as it is  imperative that you return to your primary care physician (or establish a relationship with a primary care physician if you do not have one) for your post hospital discharge needs so that they can reassess your need for medications and monitor your lab values.    Time coordinating discharge: 40 minutes  SIGNED:   Burnadette Pop, MD  [facility] 02/11/2021, 10:15 AM Pager [phone]  If 7PM-7AM, please contact night-coverage [email or web address] Password [email or web address]

## 2021-02-12 ENCOUNTER — Telehealth: Payer: Self-pay

## 2021-02-12 NOTE — Telephone Encounter (Signed)
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 Patient will see Dr. Luciana Axe tomorrow in regards that. [name] states she will make sure that her mother does not come to her appointments without assistance as she gets confused about what is going on.  In hospital Saturday and Sunday for chest pain. [name] states that the patient was under the impression that she needed to follow up with Dr. Gala Romney for heart palpitations. Advised [name] that heart palpitations were not mentioned in the office not. [name] advised History of hypertension-she is on amlodipine and ACE inhibitor's.  ACE inhibitor's are not advised for the treatment of hypertension in patients with scleroderma as they are reserved for renal hypertensive crisis.  I would like for her to discuss this further with Dr. Gala Romney so she can switch her antihypertensive medications.  She was advised to monitor blood pressure closely at home. [name] expressed understanding and will schedule appointment. She states patient has also been scheduled to see pulmonology.  [name] states they will also mention the hypertensive medications to Dr. Luciana Axe.

## 2021-02-12 NOTE — Telephone Encounter (Signed)
Patient's daughter Lynnell Dike called stating her mom was admitted to the hospital on Saturday and discharged on Sunday.  [name] states her mom had an appointment with Dr. Corliss Skains last Thursday and has some questions.

## 2021-02-13 DIAGNOSIS — E1169 Type 2 diabetes mellitus with other specified complication: Secondary | ICD-10-CM | POA: Diagnosis not present

## 2021-02-13 DIAGNOSIS — I10 Essential (primary) hypertension: Secondary | ICD-10-CM | POA: Diagnosis not present

## 2021-02-13 DIAGNOSIS — M109 Gout, unspecified: Secondary | ICD-10-CM | POA: Diagnosis not present

## 2021-02-13 DIAGNOSIS — Z09 Encounter for follow-up examination after completed treatment for conditions other than malignant neoplasm: Secondary | ICD-10-CM | POA: Diagnosis not present

## 2021-02-13 DIAGNOSIS — K219 Gastro-esophageal reflux disease without esophagitis: Secondary | ICD-10-CM | POA: Diagnosis not present

## 2021-02-13 DIAGNOSIS — G3184 Mild cognitive impairment, so stated: Secondary | ICD-10-CM | POA: Diagnosis not present

## 2021-02-13 DIAGNOSIS — I7300 Raynaud's syndrome without gangrene: Secondary | ICD-10-CM | POA: Diagnosis not present

## 2021-02-15 ENCOUNTER — Telehealth (HOSPITAL_COMMUNITY): Payer: Self-pay | Admitting: *Deleted

## 2021-02-15 NOTE — Telephone Encounter (Signed)
pts daughter Lynnell Dike called stating pt had an echo on 2/12 and wants to know does she still need an echo on 4/4.   Routed to Dr.Bensimhon

## 2021-02-15 NOTE — Telephone Encounter (Signed)
No need for repeat

## 2021-02-20 NOTE — Telephone Encounter (Signed)
Echo can be cancelled. Andrea Heath can we move pft closer to office visit and cancel echo.  Pts daughter [name] [phone]

## 2021-02-23 DIAGNOSIS — E039 Hypothyroidism, unspecified: Secondary | ICD-10-CM | POA: Diagnosis not present

## 2021-02-23 DIAGNOSIS — E1169 Type 2 diabetes mellitus with other specified complication: Secondary | ICD-10-CM | POA: Diagnosis not present

## 2021-02-23 DIAGNOSIS — K219 Gastro-esophageal reflux disease without esophagitis: Secondary | ICD-10-CM | POA: Diagnosis not present

## 2021-02-23 DIAGNOSIS — E7800 Pure hypercholesterolemia, unspecified: Secondary | ICD-10-CM | POA: Diagnosis not present

## 2021-02-23 DIAGNOSIS — I10 Essential (primary) hypertension: Secondary | ICD-10-CM | POA: Diagnosis not present

## 2021-02-23 DIAGNOSIS — E785 Hyperlipidemia, unspecified: Secondary | ICD-10-CM | POA: Diagnosis not present

## 2021-03-13 ENCOUNTER — Other Ambulatory Visit: Payer: Self-pay

## 2021-03-13 ENCOUNTER — Ambulatory Visit (INDEPENDENT_AMBULATORY_CARE_PROVIDER_SITE_OTHER): Payer: Medicare HMO | Admitting: Pulmonary Disease

## 2021-03-13 ENCOUNTER — Encounter: Payer: Self-pay | Admitting: Pulmonary Disease

## 2021-03-13 VITALS — BP 136/68 | HR 64 | Temp 98.2°F | Ht 67.0 in | Wt 166.2 lb

## 2021-03-13 DIAGNOSIS — J849 Interstitial pulmonary disease, unspecified: Secondary | ICD-10-CM

## 2021-03-13 NOTE — Progress Notes (Signed)
Andrea Heath    [id number]    May 18, 1946  Primary Care Physician:Rankins, Fanny Dance, MD  Referring Physician: Pollyann Savoy, MD [address] [place],  [place] [address]  Chief complaint: Consult for interstitial lung disease  HPI: 75 year old with limited systemic sclerosis, GERD, Raynaud's Followed by Dr. Corliss Skains.  She is not on any immunosuppressive therapy She has been referred for evaluation of interstitial lung disease  No symptoms at present, denies any dyspnea, fatigue, cough, sputum production  She had admission in February 2022 for atypical chest pain with normal troponins, echo with normal LVEF.  Advised follow-up with cardiology and she has an appointment with Dr. Gala Romney next month  Pets: Has dogs Occupation: Retired Geophysicist/field seismologist in school Exposures: No exposures.  No mold, hot tub, Jacuzzi Smoking history: Never smoker Travel history: No significant travel history Relevant family history: No family history of lung disease  Outpatient Encounter Medications as of 03/13/2021  Medication Sig  . allopurinol (ZYLOPRIM) 300 MG tablet TAKE ONE TABLET BY MOUTH ONE TIME DAILY  (Patient taking differently: Take 300 mg by mouth in the morning.)  . amLODipine (NORVASC) 5 MG tablet   . aspirin 325 MG tablet Take 325 mg by mouth in the morning.  Marland Kitchen atorvastatin (LIPITOR) 20 MG tablet TAKE 1 TABLET BY MOUTH DAILY. (Patient taking differently: Take 20 mg by mouth in the morning.)  . CALCIUM PO Take 1 tablet by mouth in the morning.  . Cholecalciferol (VITAMIN D-3) 25 MCG (1000 UT) CAPS Take 1,000-2,000 Units by mouth in the morning.  . Cyanocobalamin (VITAMIN B-12) 5000 MCG SUBL Place 5,000 mcg under the tongue daily.  . fluticasone (FLONASE) 50 MCG/ACT nasal spray USE 2 SPRAYS IN EACH NOSTRIL ONCE A DAY (Patient taking differently: Place 2 sprays into both nostrils in the morning.)  . glipiZIDE (GLUCOTROL XL) 5 MG 24 hr tablet TAKE 1 TABLET  BY MOUTH ONCE DAILY (Patient taking differently: Take 7.5 mg by mouth daily with breakfast.)  . levothyroxine (SYNTHROID, LEVOTHROID) 75 MCG tablet TAKE 1 TABLET (75 MCG TOTAL) BY MOUTH DAILY. (Patient taking differently: Take 75 mcg by mouth daily before breakfast.)  . lisinopril (ZESTRIL) 10 MG tablet Take 10-15 mg by mouth See admin instructions. Take 10 mg by mouth in the morning and increase to 15 mg during the Winter months  . Lutein 20 MG CAPS Take 20 mg by mouth daily.  Marland Kitchen PRILOSEC OTC 20 MG tablet Take 20 mg by mouth daily before breakfast.   No facility-administered encounter medications on file as of 03/13/2021.    Allergies as of 03/13/2021 - Review Complete 03/13/2021  Allergen Reaction Noted  . Other Other (See Comments) 02/10/2021    Past Medical History:  Diagnosis Date  . Diabetes mellitus   . GERD (gastroesophageal reflux disease)   . Gout   . Hyperlipidemia   . Hypertension   . Keratosis seborrheica   . Macular degeneration of both eyes   . Osteoporosis   . Raynaud's disease   . Rhinitis   . Scleroderma [facility])     Past Surgical History:  Procedure Laterality Date  . BREAST EXCISIONAL BIOPSY Left 2005  . CHOLECYSTECTOMY    . TUBAL LIGATION      Family History  Problem Relation Age of Onset  . Stroke Mother   . Heart attack Mother   . Cancer Mother        breast cancer  . Breast cancer Mother  62  . Lung cancer Father   . Achalasia Brother   . Arrhythmia Brother   . Arrhythmia Brother   . Cancer Son 20       bladder cancer     Social History   Socioeconomic History  . Marital status: Married    Spouse name: Not on file  . Number of children: Not on file  . Years of education: Not on file  . Highest education level: Not on file  Occupational History  . Not on file  Tobacco Use  . Smoking status: Never Smoker  . Smokeless tobacco: Never Used  Vaping Use  . Vaping Use: Never used  Substance and Sexual Activity  . Alcohol use: No  . Drug  use: No  . Sexual activity: Not on file  Other Topics Concern  . Not on file  Social History Narrative  . Not on file   Social Determinants of Health   Financial Resource Strain: Not on file  Food Insecurity: Not on file  Transportation Needs: Not on file  Physical Activity: Not on file  Stress: Not on file  Social Connections: Not on file  Intimate Partner Violence: Not on file    Review of systems: Review of Systems  Constitutional: Negative for fever and chills.  HENT: Negative.   Eyes: Negative for blurred vision.  Respiratory: as per HPI  Cardiovascular: Negative for chest pain and palpitations.  Gastrointestinal: Negative for vomiting, diarrhea, blood per rectum. Genitourinary: Negative for dysuria, urgency, frequency and hematuria.  Musculoskeletal: Negative for myalgias, back pain and joint pain.  Skin: Negative for itching and rash.  Neurological: Negative for dizziness, tremors, focal weakness, seizures and loss of consciousness.  Endo/Heme/Allergies: Negative for environmental allergies.  Psychiatric/Behavioral: Negative for depression, suicidal ideas and hallucinations.  All other systems reviewed and are negative.  Physical Exam: Blood pressure 136/68, pulse 64, temperature 98.2 F (36.8 C), temperature source Temporal, height 5\' 7"  (1.702 m), weight 166 lb 3.2 oz (75.4 kg), SpO2 98 %. Gen:      No acute distress HEENT:  EOMI, sclera anicteric Neck:     No masses; no thyromegaly Lungs:    Clear to auscultation bilaterally; normal respiratory effort CV:         Regular rate and rhythm; no murmurs Abd:      + bowel sounds; soft, non-tender; no palpable masses, no distension Ext:    No edema; adequate peripheral perfusion Skin:      Warm and dry; no rash Neuro: alert and oriented x 3 Psych: normal mood and affect  Data Reviewed: Imaging: Chest x-ray 02/10/2021-no acute cardiopulmonary abnormality.  I have reviewed the images  personally.  PFTs: 07/01/2017 FVC 2.65 [10%], FEV1 2.27 [99%], F/F 86, TLC 4.11 [98%] Minimal restriction, unable to perform DLCO  Labs:  Cardiac: Echocardiogram 02/10/2021 LVEF 60-65%, mild concentric LVH, normal RV systolic function and size  Assessment:  Assessment for interstitial lung disease, history of scleroderma Her PFTs from 2018 showed minimal restriction, recent chest x-ray is normal Has no respiratory symptoms at present  Schedule PFTs and high-res CT for better evaluation of the lungs  Plan/Recommendations: High-res CT, PFTs  2019 [facility] 03/13/2021, 10:47 AM  CC: 03/15/2021, MD

## 2021-03-13 NOTE — Patient Instructions (Signed)
I have reviewed your imaging and lung function tests.  They look good We would like to keep a watch on your lungs as he is is a chance that he may develop interstitial lung disease from scleroderma  I will schedule a high-resolution CT and PFTs for better evaluation of the lung  Follow-up in clinic in 3 months to review and reassess

## 2021-03-15 DIAGNOSIS — E785 Hyperlipidemia, unspecified: Secondary | ICD-10-CM | POA: Diagnosis not present

## 2021-03-15 DIAGNOSIS — E039 Hypothyroidism, unspecified: Secondary | ICD-10-CM | POA: Diagnosis not present

## 2021-03-15 DIAGNOSIS — I10 Essential (primary) hypertension: Secondary | ICD-10-CM | POA: Diagnosis not present

## 2021-03-15 DIAGNOSIS — E1169 Type 2 diabetes mellitus with other specified complication: Secondary | ICD-10-CM | POA: Diagnosis not present

## 2021-03-15 DIAGNOSIS — E7800 Pure hypercholesterolemia, unspecified: Secondary | ICD-10-CM | POA: Diagnosis not present

## 2021-03-15 DIAGNOSIS — K219 Gastro-esophageal reflux disease without esophagitis: Secondary | ICD-10-CM | POA: Diagnosis not present

## 2021-03-19 ENCOUNTER — Telehealth: Payer: Self-pay

## 2021-03-19 NOTE — Telephone Encounter (Signed)
Patient called stating she has been taking a Multivitamin every day which has calcium.  Patient states her daughter wants her to take Citracal with Vitamin D3 which has 400 mg of calcium (2 tablets per day).  Patient states "it was her understanding that you shouldn't take extra calcium if you have bone spurs."  Patient requested a return call after 12:00 pm today at #[phone]

## 2021-03-19 NOTE — Telephone Encounter (Signed)
Please advise 

## 2021-03-19 NOTE — Telephone Encounter (Signed)
I called patient, patient verbalized understanding, The dose of of calcium is 1200 mg a day.  Most of the calcium should come from diet and rest of the amount she can come from the supplement.

## 2021-03-19 NOTE — Telephone Encounter (Signed)
The dose of of calcium is 1200 mg a day.  Most of the calcium should come from diet and rest of the amount she can come from the supplement.

## 2021-03-30 ENCOUNTER — Other Ambulatory Visit (HOSPITAL_COMMUNITY)
Admission: RE | Admit: 2021-03-30 | Discharge: 2021-03-30 | Disposition: A | Discharge status: Home / Self Care | Payer: Medicare HMO | Source: Ambulatory Visit | Attending: Internal Medicine | Admitting: Internal Medicine

## 2021-03-30 DIAGNOSIS — Z01812 Encounter for preprocedural laboratory examination: Secondary | ICD-10-CM | POA: Diagnosis not present

## 2021-03-30 DIAGNOSIS — Z20822 Contact with and (suspected) exposure to covid-19: Secondary | ICD-10-CM | POA: Diagnosis not present

## 2021-03-31 LAB — SARS CORONAVIRUS 2 (TAT 6-24 HRS): SARS Coronavirus 2: NEGATIVE

## 2021-04-01 NOTE — Progress Notes (Signed)
PULMONARY HYPERTENSION CLINIC CONSULT NOTE  Referring Physician: Dr. Arlean Hopping Primary Care: Rankins, Bill Salinas, MD Pulmonary: Dr. Vaughan Browner  HPI:  Ms. Glick is a 75 year old female with DM2, HTN, scleroderma, GERD, Raynaud's referred by Dr. Estanislado Pandy for further evaluation of pulmonary HTN.  She recently saw Dr. Vaughan Browner for possible ILD. Hi-res CT and repeat PFTs ordered but not yet completed.   PFTs 2018 FEV1 2.2 L(96%) FVC  2.7 L (87%) DLCO not done   Echo 2/22: EF 60-65% RV normal. Severe MAC. Mild MS (mean 22m Hg).   Admitted 2/22 for atypical chest pain with normal troponins, echo as above with normal LVEF.   Here with her daughter. Says she is breathing pretty good. Goes to [employer] to workout. 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 No CP. No undue dyspnea. No edema, orthopnea or PND.   PFTs 4//4/22 FEV1 1.96 L (82%) FVC  2.36 L (75%) DLCO 156%   Review of Systems: [y] = yes, _0  = no   General: Weight gain _1 ; Weight loss _2 ; Anorexia _3 ; Fatigue _4 ; Fever _5 ; Chills _6 ; Weakness _7   Cardiac: Chest pain/pressure _8 ; Resting SOB _9 ; Exertional SOB _10 ; Orthopnea _11 ; Pedal Edema _12 ; Palpitations _13 ; Syncope _14 ; Presyncope _15 ; Paroxysmal nocturnal dyspnea_16   Pulmonary: Cough _17 ; Wheezing_18 ; Hemoptysis_19 ; Sputum _20 ; Snoring _21   GI: Vomiting_22 ; Dysphagia_23 ; Melena_24 ; Hematochezia _25 ; Heartburn_26 ; Abdominal pain _27 ; Constipation _28 ; Diarrhea _29 ; BRBPR _30   GU: Hematuria_31 ; Dysuria _32 ; Nocturia_33   Vascular: Pain in legs with walking _34 ; Pain in feet with lying flat _35 ; Non-healing sores _36 ; Stroke _37 ; TIA _38 ; Slurred speech _39 ;  Neuro: Headaches_40 ; Vertigo_41 ; Seizures_42 ; Paresthesias_43 ;Blurred vision _44 ; Diplopia _45 ; Vision changes _46   Ortho/Skin: Arthritis [Blue.Reese]; Joint pain [ y]; Muscle pain _47 ; Joint swelling _48 ; Back Pain _49 ; Rash _50   Psych: Depression_51 ; Anxiety_52   Heme: Bleeding problems _53 ;  Clotting disorders _54 ; Anemia _55   Endocrine: Diabetes [ y]; Thyroid dysfunction_56    Past Medical History:  Diagnosis Date  . Diabetes mellitus   . GERD (gastroesophageal reflux disease)   . Gout   . Hyperlipidemia   . Hypertension   . Keratosis seborrheica   . Macular degeneration of both eyes   . Osteoporosis   . Raynaud's disease   . Rhinitis   . [facility]     Current Outpatient Medications  Medication Sig Dispense Refill  . allopurinol (ZYLOPRIM) 300 MG tablet TAKE ONE TABLET BY MOUTH ONE TIME DAILY  30 tablet 0  . amLODipine (NORVASC) 5 MG tablet     . aspirin 325 MG tablet Take 325 mg by mouth in the morning.    .Marland Kitchenatorvastatin (LIPITOR) 20 MG tablet TAKE 1 TABLET BY MOUTH DAILY. 90 tablet 1  . Calcium Citrate-Vitamin D (CITRACAL + D PO) Take 1 tablet by mouth 2 (two) times daily.    . Cholecalciferol (VITAMIN D-3) 25 MCG (1000 UT) CAPS Take 1,000-2,000 Units by mouth in the morning.    . fluticasone (FLONASE) 50 MCG/ACT nasal spray USE 2 SPRAYS IN EACH NOSTRIL ONCE A DAY 16 g 2  . glipiZIDE (GLUCOTROL) 5 MG tablet Take 5 mg by mouth 2 (two)  times daily.    Marland Kitchen levothyroxine (SYNTHROID, LEVOTHROID) 75 MCG tablet TAKE 1 TABLET (75 MCG TOTAL) BY MOUTH DAILY. 90 tablet 1  . lisinopril (ZESTRIL) 10 MG tablet Take 10-15 mg by mouth See admin instructions. Take 10 mg by mouth in the morning and increase to 15 mg during the Winter months    . Lutein 20 MG CAPS Take 20 mg by mouth daily.    Marland Kitchen PRILOSEC OTC 20 MG tablet Take 20 mg by mouth daily before breakfast.     No current facility-administered medications for this encounter.    Allergies  Allergen Reactions  . Other Other (See Comments)    Seasonal allergies- Itchy eyes, nasal congestion, and runny nose      Social History   Socioeconomic History  . Marital status: Married    Spouse name: Not on file  . Number of children: Not on file  . Years of education: Not on file  . Highest education level: Not on  file  Occupational History  . Not on file  Tobacco Use  . Smoking status: Never Smoker  . Smokeless tobacco: Never Used  Vaping Use  . Vaping Use: Never used  Substance and Sexual Activity  . Alcohol use: No  . Drug use: No  . Sexual activity: Not on file  Other Topics Concern  . Not on file  Social History Narrative  . Not on file   Social Determinants of Health   Financial Resource Strain: Not on file  Food Insecurity: Not on file  Transportation Needs: Not on file  Physical Activity: Not on file  Stress: Not on file  Social Connections: Not on file  Intimate Partner Violence: Not on file      Family History  Problem Relation Age of Onset  . Stroke Mother   . Heart attack Mother   . Cancer Mother        breast cancer  . Breast cancer Mother 98  . Lung cancer Father   . Achalasia Brother   . Arrhythmia Brother   . Arrhythmia Brother   . Cancer Son 20       bladder cancer     Vitals:   04/02/21 1542  BP: 130/70  Pulse: (!) 109  SpO2: 99%  Weight: 74.9 kg (165 lb 3.2 oz)    PHYSICAL EXAM: General:  Well appearing. No respiratory difficulty HEENT: normal Neck: supple. no JVD. Carotids 2+ bilat; no bruits. No lymphadenopathy or thryomegaly appreciated. Cor: PMI nondisplaced. Regular rate & rhythm. No rubs, gallops or murmurs. Lungs: clear Abdomen: soft, nontender, nondistended. No hepatosplenomegaly. No bruits or masses. Good bowel sounds. Extremities: no cyanosis, clubbing, rash, edema Neuro: alert & oriented x 3, cranial nerves grossly intact. moves all 4 extremities w/o difficulty. Affect pleasant.  ASSESSMENT & PLAN:  1. Scleroderma - echo and PFTs reviewed personally. No evidence of PAH - Continue routine screening  2. Chest pain  - typical and atypical symptoms with multiple CRFs - recent hospitalization with r/o MI - echo normal. No exertional symptoms - Myoview 1/17 was normal - Discussed possibility of repeating Myoview - Given lack of  exertional she wants to defer  4. 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

## 2021-04-02 ENCOUNTER — Other Ambulatory Visit: Payer: Self-pay

## 2021-04-02 ENCOUNTER — Ambulatory Visit (HOSPITAL_COMMUNITY)
Admission: RE | Admit: 2021-04-02 | Discharge: 2021-04-02 | Disposition: A | Discharge status: Home / Self Care | Payer: Medicare HMO | Source: Ambulatory Visit | Attending: Internal Medicine | Admitting: Internal Medicine

## 2021-04-02 ENCOUNTER — Encounter (HOSPITAL_COMMUNITY): Payer: Self-pay | Admitting: Internal Medicine

## 2021-04-02 ENCOUNTER — Encounter (HOSPITAL_COMMUNITY): Payer: Medicare HMO

## 2021-04-02 ENCOUNTER — Other Ambulatory Visit (HOSPITAL_COMMUNITY): Payer: Medicare HMO

## 2021-04-02 VITALS — BP 130/70 | HR 109 | Wt 165.2 lb

## 2021-04-02 DIAGNOSIS — Z7982 Long term (current) use of aspirin: Secondary | ICD-10-CM | POA: Insufficient documentation

## 2021-04-02 DIAGNOSIS — M349 Systemic sclerosis, unspecified: Secondary | ICD-10-CM | POA: Diagnosis not present

## 2021-04-02 DIAGNOSIS — R079 Chest pain, unspecified: Secondary | ICD-10-CM | POA: Diagnosis not present

## 2021-04-02 DIAGNOSIS — I342 Nonrheumatic mitral (valve) stenosis: Secondary | ICD-10-CM

## 2021-04-02 DIAGNOSIS — I272 Pulmonary hypertension, unspecified: Secondary | ICD-10-CM | POA: Diagnosis present

## 2021-04-02 DIAGNOSIS — I7300 Raynaud's syndrome without gangrene: Secondary | ICD-10-CM | POA: Insufficient documentation

## 2021-04-02 DIAGNOSIS — Z7984 Long term (current) use of oral hypoglycemic drugs: Secondary | ICD-10-CM | POA: Insufficient documentation

## 2021-04-02 DIAGNOSIS — R0789 Other chest pain: Secondary | ICD-10-CM | POA: Diagnosis not present

## 2021-04-02 DIAGNOSIS — Z79890 Hormone replacement therapy (postmenopausal): Secondary | ICD-10-CM | POA: Diagnosis not present

## 2021-04-02 DIAGNOSIS — K219 Gastro-esophageal reflux disease without esophagitis: Secondary | ICD-10-CM | POA: Insufficient documentation

## 2021-04-02 DIAGNOSIS — Z79899 Other long term (current) drug therapy: Secondary | ICD-10-CM | POA: Insufficient documentation

## 2021-04-02 DIAGNOSIS — I080 Rheumatic disorders of both mitral and aortic valves: Secondary | ICD-10-CM | POA: Insufficient documentation

## 2021-04-02 DIAGNOSIS — I10 Essential (primary) hypertension: Secondary | ICD-10-CM | POA: Diagnosis not present

## 2021-04-02 DIAGNOSIS — E119 Type 2 diabetes mellitus without complications: Secondary | ICD-10-CM | POA: Insufficient documentation

## 2021-04-02 LAB — PULMONARY FUNCTION TEST
DL/VA % pred: 93 %
DL/VA: 3.74 ml/min/mmHg/L
DLCO unc % pred: 156 %
DLCO unc: 32.84 ml/min/mmHg
FEF 25-75 Post: 1.84 L/sec
FEF 25-75 Pre: 2.74 L/sec
FEF2575-%Change-Post: -32 %
FEF2575-%Pred-Post: 102 %
FEF2575-%Pred-Pre: 153 %
FEV1-%Change-Post: -1 %
FEV1-%Pred-Post: 81 %
FEV1-%Pred-Pre: 82 %
FEV1-Post: 1.93 L
FEV1-Pre: 1.96 L
FEV1FVC-%Change-Post: -3 %
FEV1FVC-%Pred-Pre: 111 %
FEV6-%Change-Post: 5 %
FEV6-%Pred-Post: 79 %
FEV6-%Pred-Pre: 75 %
FEV6-Post: 2.38 L
FEV6-Pre: 2.26 L
FEV6FVC-%Change-Post: 0 %
FEV6FVC-%Pred-Post: 104 %
FEV6FVC-%Pred-Pre: 104 %
FVC-%Change-Post: 1 %
FVC-%Pred-Post: 76 %
FVC-%Pred-Pre: 75 %
FVC-Post: 2.4 L
FVC-Pre: 2.36 L
Post FEV1/FVC ratio: 80 %
Post FEV6/FVC ratio: 99 %
Pre FEV1/FVC ratio: 83 %
Pre FEV6/FVC Ratio: 100 %
RV % pred: -36 %
RV: -0.9 L
TLC % pred: 97 %
TLC: 5.35 L

## 2021-04-02 MED ORDER — ALBUTEROL SULFATE (2.5 MG/3ML) 0.083% IN NEBU
2.5000 mg | INHALATION_SOLUTION | Freq: Once | RESPIRATORY_TRACT | Status: AC
  Administered 2021-04-02: 2.5 mg via RESPIRATORY_TRACT

## 2021-04-02 NOTE — Patient Instructions (Signed)
Please call our office in 1 year to schedule your follow up appointment and echocardiogram  If you have any questions or concerns before your next appointment please send [place] a message through [place] or call our office at [phone].    TO LEAVE A MESSAGE FOR THE NURSE SELECT OPTION 2, PLEASE LEAVE A MESSAGE INCLUDING: . YOUR NAME . DATE OF BIRTH . CALL BACK NUMBER . REASON FOR CALL**this is important as we prioritize the call backs  YOU WILL RECEIVE A CALL BACK THE SAME DAY AS LONG AS YOU CALL BEFORE 4:00 PM  At the [facility], you and your health needs are our priority. As part of our continuing mission to provide you with exceptional heart care, we have created designated Provider Care Teams. These Care Teams include your primary Cardiologist (physician) and Advanced Practice Providers (APPs- Physician Assistants and Nurse Practitioners) who all work together to provide you with the care you need, when you need it.   You may see any of the following providers on your designated Care Team at your next follow up: Marland Kitchen Dr Arvilla Meres . Dr Marca Ancona . Dr Thornell Mule . Tonye Becket, NP . Robbie Lis, PA . Shanda Bumps [place],NP . Karle Plumber, PharmD   Please be sure to bring in all your medications bottles to every appointment.

## 2021-04-19 ENCOUNTER — Encounter: Payer: Self-pay | Admitting: Neurology

## 2021-04-19 ENCOUNTER — Ambulatory Visit (INDEPENDENT_AMBULATORY_CARE_PROVIDER_SITE_OTHER): Payer: Medicare HMO | Admitting: Neurology

## 2021-04-19 VITALS — BP 167/79 | HR 91 | Ht 67.0 in | Wt 166.0 lb

## 2021-04-19 DIAGNOSIS — R4189 Other symptoms and signs involving cognitive functions and awareness: Secondary | ICD-10-CM

## 2021-04-19 DIAGNOSIS — E539 Vitamin B deficiency, unspecified: Secondary | ICD-10-CM | POA: Diagnosis not present

## 2021-04-19 DIAGNOSIS — G309 Alzheimer's disease, unspecified: Secondary | ICD-10-CM

## 2021-04-19 DIAGNOSIS — R799 Abnormal finding of blood chemistry, unspecified: Secondary | ICD-10-CM | POA: Diagnosis not present

## 2021-04-19 DIAGNOSIS — E538 Deficiency of other specified B group vitamins: Secondary | ICD-10-CM | POA: Diagnosis not present

## 2021-04-19 DIAGNOSIS — E519 Thiamine deficiency, unspecified: Secondary | ICD-10-CM | POA: Diagnosis not present

## 2021-04-19 DIAGNOSIS — G3184 Mild cognitive impairment, so stated: Secondary | ICD-10-CM | POA: Diagnosis not present

## 2021-04-19 DIAGNOSIS — E079 Disorder of thyroid, unspecified: Secondary | ICD-10-CM | POA: Diagnosis not present

## 2021-04-19 NOTE — Progress Notes (Signed)
[facility]    Provider:  Dr Lucia Gaskins Referring Provider: Clayborn Heron, MD Primary Care Physician:  Clayborn Heron, MD  CC:  Memory loss.   HPI April 19, 2021:  Andrea Heath is a 75 y.o. female here as a referral from Dr. 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  She has a past medical history of hypertension, Sjogren's, diabetes type 2, depression, hypothyroidism, mild cognitive impairment diagnosed with formal memory testing at [facility] in 2017 we saw her in 2018 and she was diagnosed with Alzhemeimers dementia. MRI of the brain was unremarkable for age.   Patient is here with her husband and her daughter however she refuses to have either in the office today.  Husband waits in the lobby, daughter waits in the lab area separately.  Extended visit, after I had an appointment with patient that I had to go out and spend time repeating much of what I just went over with patient with husband and then similarly separately with daughter.    Patient's report: She feels she is doing the same. Her short term memory is still impaired but not progressing, long term memory is good. Lives with her husband of 55 years. In the same house. She is driving but no accidents, she got lost once but she put it in her GPS and she was fine. She doesn't go a lot of places anymore. She is still social, goes to church and shopping, she makes lists. Husband takes acre of the bills. Her MMSE was 26 in 2018 today 21. She takes her own medications. She fills her box up and everything is fine doesn't forget medications. No falls, trouble swallowing, she exercises at [facility] 3x a week. She did not tolerate aricept. She has 2 paternal aunts and one with dementia and another with memory loss, her brother that has dementia unknown what type. She declines formal memory testing.   Daughter: Daughter feels that she is declining tremendously, daughter and son are very concerned, she is more confused, she  misses her medications or takes multiple doses, she feels that her mother refuses to have daughter in the room because daughter will say things and mother does not want to hear, I did let daughter know MMSE was reduced, the patient refused to have repeat neurocognitive testing, that we discussed medications but patient was hesitant (husband wanted her to start medication) and I told both husband and daughter that they needed to have a family discussion that I cannot solve these issues between them and that may be therapy would help.  However it is extremely difficult with navigating these issues within the family when a patient with dementia has very poor insight and judgment.  Patient complains of symptoms per HPI as well as the following symptoms: denies any other symproms . Pertinent negatives and positives per HPI. All others negative   September 2018: Since then things are stable. She has had 2 episodes of forgetting what she did, she couldn't remember walking to the mailbox or what she did, it came back to her later. Last one was 18 months ago, this happened a few weekends in a row and that's when the memory changes started. Was in the setting of pneumonia and was treated. They feel memory is stable. A Maternal and a Paternal Aunt had memory loss. Both parents died in their 11s.so unclear if they had memory issues or were to develop. No falls, no hallucinations, no depression or anxiety, no accidents in the home.  There is frustration over memory loss. No behavioral problems. Here with husband who also provides information. She reads, goes to the gym 3x a week, plays computer games.   Reviewed notes, labs and imaging from outside physicians, which showed:  B12 427, TSH WNL  CT head 2017 showed No acute intracranial abnormalities including mass lesion or mass effect, hydrocephalus, extra-axial fluid collection, midline shift, hemorrhage, or acute infarction, large ischemic events (personally reviewed  images)     Review of Systems: Patient complains of symptoms per HPI as well as the following symptoms: memory loss. Pertinent negatives and positives per HPI. All others negative.   Social History   Socioeconomic History  . Marital status: Married    Spouse name: Not on file  . Number of children: Not on file  . Years of education: Not on file  . Highest education level: High school graduate  Occupational History  . Not on file  Tobacco Use  . Smoking status: Never Smoker  . Smokeless tobacco: Never Used  Vaping Use  . Vaping Use: Never used  Substance and Sexual Activity  . Alcohol use: No  . Drug use: No  . Sexual activity: Not on file  Other Topics Concern  . Not on file  Social History Narrative   Lives at home with husband   Right handed   Caffeine: 1 cup/day   Social Determinants of Health   Financial Resource Strain: Not on file  Food Insecurity: Not on file  Transportation Needs: Not on file  Physical Activity: Not on file  Stress: Not on file  Social Connections: Not on file  Intimate Partner Violence: Not on file    Family History  Problem Relation Age of Onset  . Stroke Mother   . Heart attack Mother   . Cancer Mother        breast cancer  . Breast cancer Mother [id number]  . Lung cancer Father   . Achalasia Brother   . Arrhythmia Brother   . Arrhythmia Brother   . Cancer Son 20       bladder cancer   . Alzheimer's disease Paternal Aunt        "abusive"  . Alzheimer's disease Paternal Aunt        "abusive"    Past Medical History:  Diagnosis Date  . Diabetes mellitus   . GERD (gastroesophageal reflux disease)   . Gout   . Hyperlipidemia   . Hypertension   . Keratosis seborrheica   . Macular degeneration of both eyes   . Osteoporosis   . Raynaud's disease   . Rhinitis   . Scleroderma [facility](HCC)     Past Surgical History:  Procedure Laterality Date  . BREAST EXCISIONAL BIOPSY Left 2005  . CHOLECYSTECTOMY    . TUBAL LIGATION       Current Outpatient Medications  Medication Sig Dispense Refill  . allopurinol (ZYLOPRIM) 300 MG tablet TAKE ONE TABLET BY MOUTH ONE TIME DAILY  30 tablet 0  . amLODipine (NORVASC) 5 MG tablet     . aspirin 325 MG tablet Take 325 mg by mouth in the morning.    Marland Kitchen. atorvastatin (LIPITOR) 20 MG tablet TAKE 1 TABLET BY MOUTH DAILY. 90 tablet 1  . Calcium Citrate-Vitamin D (CITRACAL + D PO) Take 1 tablet by mouth 2 (two) times daily.    . Cholecalciferol (VITAMIN D-3) 25 MCG (1000 UT) CAPS Take 1,000-2,000 Units by mouth in the morning.    . fluticasone (FLONASE) 50 MCG/ACT  nasal spray USE 2 SPRAYS IN EACH NOSTRIL ONCE A DAY 16 g 2  . glipiZIDE (GLUCOTROL) 5 MG tablet Take 5 mg by mouth 2 (two) times daily.    Marland Kitchen levothyroxine (SYNTHROID, LEVOTHROID) 75 MCG tablet TAKE 1 TABLET (75 MCG TOTAL) BY MOUTH DAILY. 90 tablet 1  . lisinopril (ZESTRIL) 10 MG tablet Take 10-15 mg by mouth See admin instructions. Take 10 mg by mouth in the morning and increase to 15 mg during the Winter months    . Lutein 20 MG CAPS Take 20 mg by mouth daily.    Marland Kitchen PRILOSEC OTC 20 MG tablet Take 20 mg by mouth daily before breakfast.     No current facility-administered medications for this visit.    Allergies as of 04/19/2021 - Review Complete 04/19/2021  Allergen Reaction Noted  . Other Other (See Comments) 02/10/2021    Vitals: BP (!) 167/79 (BP Location: Right Arm, Patient Position: Sitting)   Pulse 91   Ht 5\' 7"  (1.702 m)   Wt 166 lb (75.3 kg)   BMI 26.00 kg/m  Last Weight:  Wt Readings from Last 1 Encounters:  04/19/21 166 lb (75.3 kg)   Last Height:   Ht Readings from Last 1 Encounters:  04/19/21 5\' 7"  (1.702 m)   Physical exam: Exam: Gen: NAD, conversant, well nourised, well groomed                     CV: RRR, no MRG. No Carotid Bruits. No peripheral edema, warm, nontender Eyes: Conjunctivae clear without exudates or hemorrhage  Neuro: Detailed Neurologic Exam  Speech:    Speech is normal;  fluent and spontaneous with normal comprehension.  Cognition: MMSE - Mini Mental State Exam 04/19/2021 09/25/2017  Orientation to time 4 4  Orientation to Place 4 5  Registration 3 3  Attention/ Calculation 1 5  Recall 1 0  Language- name 2 objects 2 2  Language- repeat 1 1  Language- follow 3 step command 3 3  Language- read & follow direction 1 1  Write a sentence 1 1  Copy design 0 1  Total score 21 26     Cranial Nerves:    The pupils are equal, round, and reactive to light.  Pupils too small to visualize fundi.  Visual fields are full to finger confrontation. Extraocular movements are intact. Trigeminal sensation is intact and the muscles of mastication are normal. The face is symmetric. The palate elevates in the midline. Hearing intact. Voice is normal. Shoulder shrug is normal. The tongue has normal motion without fasciculations.   Coordination:    Normal    Gait:    normal   Motor Observation:    No asymmetry, no atrophy, and no involuntary movements noted. Tone:    Normal muscle tone.    Posture:    Posture is normal. normal erect    Strength:    Strength is V/V in the upper and lower limbs.      Sensation: intact to LT     Reflex Exam:  DTR's:    Deep tendon reflexes in the upper and lower extremities are symmetrical bilaterally.   Toes:    The toes are downgoing bilaterally.   Clonus:    Clonus is absent.      Assessment/Plan:  Andrea Heath is a 75 y.o. female here as a referral from Dr. 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  She has a past medical history of hypertension, Sjogren's, diabetes type 2,  depression, hypothyroidism, mild cognitive impairment diagnosed with formal memory testing at [facility] in 2017 we saw her in 2018 and she was diagnosed with amnestic mild cognitive impairment. MRI of the brain was unremarkable for age at that time MMSE has declined and symptoms concerning for progression to Alzheimers.   - Patient is here with her  husband and her daughter however she refuses to have either in the office today.  Husband waits in the lobby, daughter waits in the lab area separately.  Extended visit because after I had an appointment with patient then I had to spend time repeating much of what I just went over with patient with husband and patient and then similarly separately with daughter and patient.  - Patient's MMSE has declined from 26 to 21 since 2018.  It is concerning for progression to Alzheimer's given clinical symptoms and report from daughter.  I feel for this family, it is an extremely difficult situation when patients with cognitive impairment have poor judgment and insight and refused to acknowledge their memory dysfunction and become angry with the family when they expressed those concerns.  Daughter really just wants a diagnosis for planning, husband wants her to be on some sort of medication.   - she did not tolerate Aricept, we discussed memantine - patient refuses to have follow-up neurocognitive testing.   - I discussed we could order an FDG PET scan which is quite sensitive for Alzheimer's dementia and from discussions patient seemed to agree, daughter was very much in favor, and husband was fine with it.  So at this time I will order an FDG PET scan and perform lab testing.    - Patient refuses follow up with me, would like to return to Dr. Barbaraann Barthel  Orders Placed This Encounter  Procedures  . NM PET Metabolic Brain  . Vitamin B1  . B12 and Folate Panel  . Methylmalonic acid, serum  . Homocysteine  . TSH  . Basic Metabolic Panel    Naomie Dean, MD  [facility] [address] [place], [place] [address]  Phone [phone] Fax [phone]  I spent over 60  minutes of face-to-face and non-face-to-face time with patient on the  1. Cognitive decline   2. Amnestic MCI (mild cognitive impairment with memory loss)   3. Alzheimer's disease, unspecified (CODE) (HCC)     diagnosis.  This included previsit chart review, lab review, study review, order entry, electronic health record documentation, patient education on the different diagnostic and therapeutic options, counseling and coordination of care, risks and benefits of management, compliance, or risk factor reduction

## 2021-04-19 NOTE — Patient Instructions (Signed)
FDG PET Scan Consider memantine  Memantine Tablets What is this medicine? MEMANTINE (MEM an teen) is used to treat dementia caused by Alzheimer's disease. This medicine may be used for other purposes; ask your health care provider or pharmacist if you have questions. COMMON BRAND NAME(S): Namenda What should I tell my health care provider before I take this medicine? They need to know if you have any of these conditions:  difficulty passing urine  kidney disease  liver disease  seizures  an unusual or allergic reaction to memantine, other medicines, foods, dyes, or preservatives  pregnant or trying to get pregnant  breast-feeding How should I use this medicine? Take this medicine by mouth with a glass of water. Follow the directions on the prescription label. You may take this medicine with or without food. Take your doses at regular intervals. Do not take your medicine more often than directed. Continue to take your medicine even if you feel better. Do not stop taking except on the advice of your doctor or health care professional. Talk to your pediatrician regarding the use of this medicine in children. Special care may be needed. Overdosage: If you think you have taken too much of this medicine contact a poison control center or emergency room at once. NOTE: This medicine is only for you. Do not share this medicine with others. What if I miss a dose? If you miss a dose, take it as soon as you can. If it is almost time for your next dose, take only that dose. Do not take double or extra doses. If you do not take your medicine for several days, contact your health care provider. Your dose may need to be changed. What may interact with this medicine?  acetazolamide  amantadine  cimetidine  dextromethorphan  dofetilide  hydrochlorothiazide  ketamine  metformin  methazolamide  quinidine  ranitidine  sodium bicarbonate  triamterene This list may not describe all  possible interactions. Give your health care provider a list of all the medicines, herbs, non-prescription drugs, or dietary supplements you use. Also tell them if you smoke, drink alcohol, or use illegal drugs. Some items may interact with your medicine. What should I watch for while using this medicine? Visit your doctor or health care professional for regular checks on your progress. Check with your doctor or health care professional if there is no improvement in your symptoms or if they get worse. You may get drowsy or dizzy. Do not drive, use machinery, or do anything that needs mental alertness until you know how this drug affects you. Do not stand or sit up quickly, especially if you are an older patient. This reduces the risk of dizzy or fainting spells. Alcohol can make you more drowsy and dizzy. Avoid alcoholic drinks. What side effects may I notice from receiving this medicine? Side effects that you should report to your doctor or health care professional as soon as possible:  allergic reactions like skin rash, itching or hives, swelling of the face, lips, or tongue  agitation or a feeling of restlessness  depressed mood  dizziness  hallucinations  redness, blistering, peeling or loosening of the skin, including inside the mouth  seizures  vomiting Side effects that usually do not require medical attention (report to your doctor or health care professional if they continue or are bothersome):  constipation  diarrhea  headache  nausea  trouble sleeping This list may not describe all possible side effects. Call your doctor for medical advice about side  effects. You may report side effects to FDA at 1-800-FDA-1088. Where should I keep my medicine? Keep out of the reach of children. Store at room temperature between 15 degrees and 30 degrees C (59 degrees and 86 degrees F). Throw away any unused medicine after the expiration date. NOTE: This sheet is a summary. It may not  cover all possible information. If you have questions about this medicine, talk to your doctor, pharmacist, or health care provider.  2021 Elsevier/Gold Standard (2013-10-04 14:10:42)

## 2021-04-20 ENCOUNTER — Telehealth: Payer: Self-pay | Admitting: Neurology

## 2021-04-20 ENCOUNTER — Encounter: Payer: Self-pay | Admitting: Neurology

## 2021-04-20 DIAGNOSIS — R4189 Other symptoms and signs involving cognitive functions and awareness: Secondary | ICD-10-CM | POA: Insufficient documentation

## 2021-04-20 DIAGNOSIS — G3184 Mild cognitive impairment, so stated: Secondary | ICD-10-CM

## 2021-04-20 HISTORY — DX: Mild cognitive impairment of uncertain or unknown etiology: G31.84

## 2021-04-20 NOTE — Telephone Encounter (Signed)
Pt called, after talking with my family I have decided to try the Memantine. Would like a call from the nurse. You can speak with my husband when you call back.

## 2021-04-23 MED ORDER — MEMANTINE HCL 10 MG PO TABS: 10.0000 mg | ORAL_TABLET | Freq: Two times a day (BID) | ORAL | 0 refills | Status: DC

## 2021-04-23 NOTE — Telephone Encounter (Signed)
Dr. Lucia Gaskins is out of the office today. I have spoken with Dr Terrace Arabia, work-in MD, who has authorized a prescription of Memantine 10 mg PO BID #60.

## 2021-04-23 NOTE — Telephone Encounter (Signed)
Spoke with the patient and we discussed the memantine instructions.  Patient verbalized understanding that she is to take 1 tablet in the morning and 1 tablet at night/evening. Pt aware there is a small chance of GI s/e. She stated she has a good relationship with her pharmacist and will be in touch with them.  She states she manages her own medications and feels comfortable doing this.  She was very appreciative for the call.  I asked her to give [place] a call if she has any further questions or concerns.   Memantine 10 mg PO BID #60 sent to [facility] pharmacy per v.o. Dr Terrace Arabia.

## 2021-04-23 NOTE — Addendum Note (Signed)
Addended by: Bertram Savin on: 04/23/2021 04:52 PM   Modules accepted: Orders

## 2021-04-29 DIAGNOSIS — M25572 Pain in left ankle and joints of left foot: Secondary | ICD-10-CM | POA: Diagnosis not present

## 2021-04-29 DIAGNOSIS — S92352A Displaced fracture of fifth metatarsal bone, left foot, initial encounter for closed fracture: Secondary | ICD-10-CM | POA: Diagnosis not present

## 2021-04-30 LAB — B12 AND FOLATE PANEL
Folate: 9.8 ng/mL (ref >3.0)
Vitamin B-12: 492 pg/mL (ref 232–1245)

## 2021-04-30 LAB — METHYLMALONIC ACID, SERUM: Methylmalonic Acid: 193 nmol/L (ref 0–378)

## 2021-04-30 LAB — BASIC METABOLIC PANEL
BUN/Creatinine Ratio: 13 (ref 12–28)
BUN: 13 mg/dL (ref 8–27)
CO2: 21 mmol/L (ref 20–29)
Calcium: 9.6 mg/dL (ref 8.7–10.3)
Chloride: 100 mmol/L (ref 96–106)
Creatinine, Ser: 1 mg/dL (ref 0.57–1.00)
Glucose: 159 mg/dL — ABNORMAL HIGH (ref 65–99)
Potassium: 4.9 mmol/L (ref 3.5–5.2)
Sodium: 135 mmol/L (ref 134–144)
eGFR: 58 mL/min/{1.73_m2} — ABNORMAL LOW (ref >59)

## 2021-04-30 LAB — TSH: TSH: 0.88 u[IU]/mL (ref 0.450–4.500)

## 2021-04-30 LAB — HOMOCYSTEINE: Homocysteine: 9.6 umol/L (ref 0.0–19.2)

## 2021-04-30 LAB — VITAMIN B1: Thiamine: 110.1 nmol/L (ref 66.5–200.0)

## 2021-05-01 ENCOUNTER — Telehealth: Payer: Self-pay | Admitting: Neurology

## 2021-05-01 DIAGNOSIS — M349 Systemic sclerosis, unspecified: Secondary | ICD-10-CM | POA: Insufficient documentation

## 2021-05-01 DIAGNOSIS — R413 Other amnesia: Secondary | ICD-10-CM

## 2021-05-01 DIAGNOSIS — S92352A Displaced fracture of fifth metatarsal bone, left foot, initial encounter for closed fracture: Secondary | ICD-10-CM | POA: Diagnosis not present

## 2021-05-01 DIAGNOSIS — M79672 Pain in left foot: Secondary | ICD-10-CM | POA: Diagnosis not present

## 2021-05-01 HISTORY — DX: Systemic sclerosis, unspecified: M34.9

## 2021-05-01 NOTE — Telephone Encounter (Signed)
[employer] pending uplaoded notes on the portal

## 2021-05-01 NOTE — Telephone Encounter (Signed)
Toy Cookey: [id number] (exp. 04/26/21 to 05/26/21) it is ready to be scheduled sent message to [facility]

## 2021-05-07 NOTE — Telephone Encounter (Signed)
Wrong authorization patient no longer has [employer]. She has [employer] I will start the case with Andrea Heath medicare for the PET scan,.

## 2021-05-07 NOTE — Telephone Encounter (Signed)
Started case on the [employer] portal. Case # [id number].

## 2021-05-09 ENCOUNTER — Ambulatory Visit (HOSPITAL_COMMUNITY): Payer: Medicare HMO

## 2021-05-09 NOTE — Telephone Encounter (Signed)
I spoke with a nurse with [employer] and she was unable to get it approved on her level. She had to forward it to the MD. It is in MD review right now.

## 2021-05-10 NOTE — Telephone Encounter (Signed)
I checked the status on [employer] portal and it is still in MD review.

## 2021-05-11 ENCOUNTER — Ambulatory Visit (HOSPITAL_COMMUNITY): Payer: Medicare HMO

## 2021-05-16 DIAGNOSIS — E7800 Pure hypercholesterolemia, unspecified: Secondary | ICD-10-CM | POA: Diagnosis not present

## 2021-05-16 DIAGNOSIS — I10 Essential (primary) hypertension: Secondary | ICD-10-CM | POA: Diagnosis not present

## 2021-05-16 DIAGNOSIS — E1169 Type 2 diabetes mellitus with other specified complication: Secondary | ICD-10-CM | POA: Diagnosis not present

## 2021-05-16 DIAGNOSIS — K219 Gastro-esophageal reflux disease without esophagitis: Secondary | ICD-10-CM | POA: Diagnosis not present

## 2021-05-16 DIAGNOSIS — E785 Hyperlipidemia, unspecified: Secondary | ICD-10-CM | POA: Diagnosis not present

## 2021-05-16 DIAGNOSIS — E039 Hypothyroidism, unspecified: Secondary | ICD-10-CM | POA: Diagnosis not present

## 2021-05-23 DIAGNOSIS — S92355A Nondisplaced fracture of fifth metatarsal bone, left foot, initial encounter for closed fracture: Secondary | ICD-10-CM | POA: Diagnosis not present

## 2021-05-23 NOTE — Telephone Encounter (Signed)
Often patient's have to have formal memory testing prior to insurance approving the FDG PET Scan. We can refer them to [facility] Dr. Lajean Silvius, he is a subspecialist in memory disorders and they have more resources to be able to get things completed. I would suggest we refer to [facility] for evaluation. thanks

## 2021-05-23 NOTE — Telephone Encounter (Signed)
I checked status on [employer] portal. The request was denied. I am not sure why because Irving Burton submitted this request & there is no reason for the denial on the portal, but there is a message that states: "A Reconsideration or Appeal that is managed by [employer], and labeled as allowed above, may be submitted in writing via fax to [phone]. Be sure to label your request as a Reconsideration or Appeal, as appropriate. To request via P2P call [employer] at [phone], option 4."   Case #[id number]

## 2021-05-23 NOTE — Telephone Encounter (Signed)
Pt's husband, Laneka Mcgrory (on [place]) called, want know the status getting her scan approved with the insurance and what is the next step. Would like a call back

## 2021-05-23 NOTE — Telephone Encounter (Signed)
I called patient & her husband to give this information. I spoke with the patient, who states her husband is not home at the moment but she would like for me to speak with him. I provided her with my direct phone number here at the office. She states she will tell him to call me once he returns.

## 2021-05-24 NOTE — Telephone Encounter (Signed)
I was able to speak to patient's husband today. He is okay with a referral to Dr. Lajean Silvius at [facility]. I will work on it once the referral is put in.

## 2021-05-24 NOTE — Addendum Note (Signed)
Addended by: Bertram Savin on: 05/24/2021 02:24 PM   Modules accepted: Orders

## 2021-05-24 NOTE — Telephone Encounter (Signed)
Per v.o. Dr Lucia Gaskins, referral to Dr Lajean Silvius at [facility] placed.

## 2021-05-29 NOTE — Telephone Encounter (Signed)
I called [facility] @ [phone] and spoke with Devin to provide patient demographics and reason for requested visit. After our call, I faxed records per Devin to [phone], attn. Jasmine.

## 2021-06-04 ENCOUNTER — Other Ambulatory Visit: Payer: Self-pay | Admitting: Family Medicine

## 2021-06-04 DIAGNOSIS — M8580 Other specified disorders of bone density and structure, unspecified site: Secondary | ICD-10-CM

## 2021-06-04 NOTE — Telephone Encounter (Signed)
I received a VM from patient's husband over the weekend regarding patient's referral to [facility]. I attempted to call him back at the number provided, [phone], but was routed to his VM. I left a message directing him to call [facility] at [phone]. Based on their notes in patient's chart, it appears that they tried to reach out to the patient on 6/3 and they left a message.

## 2021-06-08 DIAGNOSIS — Z1389 Encounter for screening for other disorder: Secondary | ICD-10-CM | POA: Diagnosis not present

## 2021-06-08 DIAGNOSIS — Z0000 Encounter for general adult medical examination without abnormal findings: Secondary | ICD-10-CM | POA: Diagnosis not present

## 2021-06-21 ENCOUNTER — Other Ambulatory Visit: Payer: Self-pay | Admitting: Neurology

## 2021-07-04 DIAGNOSIS — S92355A Nondisplaced fracture of fifth metatarsal bone, left foot, initial encounter for closed fracture: Secondary | ICD-10-CM | POA: Diagnosis not present

## 2021-07-10 DIAGNOSIS — E119 Type 2 diabetes mellitus without complications: Secondary | ICD-10-CM | POA: Diagnosis not present

## 2021-07-10 DIAGNOSIS — H35371 Puckering of macula, right eye: Secondary | ICD-10-CM | POA: Diagnosis not present

## 2021-07-10 DIAGNOSIS — H524 Presbyopia: Secondary | ICD-10-CM | POA: Diagnosis not present

## 2021-07-10 DIAGNOSIS — H35033 Hypertensive retinopathy, bilateral: Secondary | ICD-10-CM | POA: Diagnosis not present

## 2021-07-10 DIAGNOSIS — H35363 Drusen (degenerative) of macula, bilateral: Secondary | ICD-10-CM | POA: Diagnosis not present

## 2021-07-10 DIAGNOSIS — H5213 Myopia, bilateral: Secondary | ICD-10-CM | POA: Diagnosis not present

## 2021-07-11 ENCOUNTER — Telehealth: Payer: Self-pay | Admitting: Neurology

## 2021-07-11 NOTE — Telephone Encounter (Signed)
Pt's husband called wanting to inform the provider that the memantine (NAMENDA) 10 MG tablet is causing the pt to have headaches and they would like to discuss this with the provider or RN. Please advise.

## 2021-07-11 NOTE — Telephone Encounter (Signed)
Addressed in other phone note.

## 2021-07-11 NOTE — Telephone Encounter (Signed)
Returned Ron's (on [place]) call at [phone] and LVM letting him know we would try to call again tomorrow morning.

## 2021-07-11 NOTE — Telephone Encounter (Signed)
Received voicemail from patient's husband regarding memantine. He states patient is having bad headaches. He would like a call back at [phone].

## 2021-07-12 NOTE — Telephone Encounter (Signed)
Spoke to husband of pt.  She has been taking memantine 10mg  po bid for 8-10 days.  She had a headache (she normally does not have headaches, took motrin and went away).  She stopped the memantine.  I relayed that can retake the memantine and see if gets headache. If so then stop and will place on allergy list.  He will stop for a week and then restart and see if happens again.

## 2021-07-12 NOTE — Telephone Encounter (Signed)
I see pt's husband called back and call was charted in other phone note.

## 2021-07-16 DIAGNOSIS — I10 Essential (primary) hypertension: Secondary | ICD-10-CM | POA: Diagnosis not present

## 2021-07-16 DIAGNOSIS — E785 Hyperlipidemia, unspecified: Secondary | ICD-10-CM | POA: Diagnosis not present

## 2021-07-16 DIAGNOSIS — K219 Gastro-esophageal reflux disease without esophagitis: Secondary | ICD-10-CM | POA: Diagnosis not present

## 2021-07-16 DIAGNOSIS — E039 Hypothyroidism, unspecified: Secondary | ICD-10-CM | POA: Diagnosis not present

## 2021-07-16 DIAGNOSIS — E7800 Pure hypercholesterolemia, unspecified: Secondary | ICD-10-CM | POA: Diagnosis not present

## 2021-07-16 DIAGNOSIS — E1169 Type 2 diabetes mellitus with other specified complication: Secondary | ICD-10-CM | POA: Diagnosis not present

## 2021-07-17 DIAGNOSIS — M349 Systemic sclerosis, unspecified: Secondary | ICD-10-CM | POA: Diagnosis not present

## 2021-07-17 DIAGNOSIS — E1169 Type 2 diabetes mellitus with other specified complication: Secondary | ICD-10-CM | POA: Diagnosis not present

## 2021-07-17 DIAGNOSIS — E7800 Pure hypercholesterolemia, unspecified: Secondary | ICD-10-CM | POA: Diagnosis not present

## 2021-07-17 DIAGNOSIS — E039 Hypothyroidism, unspecified: Secondary | ICD-10-CM | POA: Diagnosis not present

## 2021-07-17 DIAGNOSIS — Z0100 Encounter for examination of eyes and vision without abnormal findings: Secondary | ICD-10-CM | POA: Diagnosis not present

## 2021-07-17 DIAGNOSIS — M109 Gout, unspecified: Secondary | ICD-10-CM | POA: Diagnosis not present

## 2021-07-17 DIAGNOSIS — R4189 Other symptoms and signs involving cognitive functions and awareness: Secondary | ICD-10-CM | POA: Diagnosis not present

## 2021-07-17 DIAGNOSIS — I7300 Raynaud's syndrome without gangrene: Secondary | ICD-10-CM | POA: Diagnosis not present

## 2021-07-17 DIAGNOSIS — I10 Essential (primary) hypertension: Secondary | ICD-10-CM | POA: Diagnosis not present

## 2021-07-24 NOTE — Progress Notes (Signed)
Office Visit Note  Patient: Andrea Heath             Date of Birth: 1946/12/05           MRN: [id number]             PCP: Clayborn Heron, MD Referring: Clayborn Heron, MD Visit Date: 08/07/2021 Occupation: @GUAROCC @  Subjective:  Medication monitoring.   History of Present Illness: Andrea Heath is a 75 y.o. female with history of gouty arthropathy and scleroderma.  She states in the beginning of the summer she for a tumble down a hill and sprained her left ankle which is gradually healed.  She has been wearing shoes and walking normally now.  She denies any increased Raynauds.  She states the symptoms are controlled during the summertime.  She has not noticed any increased tightness in her skin.  She denies any shortness of breath.  She has not had any gout flare.  She has been taking allopurinol on a regular basis.  Activities of Daily Living:  Patient reports morning stiffness for 0 minutes.   Patient Denies nocturnal pain.  Difficulty dressing/grooming: Denies Difficulty climbing stairs: Denies Difficulty getting out of chair: Denies Difficulty using hands for taps, buttons, cutlery, and/or writing: Denies  Review of Systems  Constitutional:  Negative for fatigue.  HENT:  Negative for mouth sores, mouth dryness and nose dryness.   Eyes:  Negative for pain, itching and dryness.  Respiratory:  Negative for shortness of breath and difficulty breathing.   Cardiovascular:  Negative for chest pain and palpitations.  Gastrointestinal:  Negative for blood in stool, constipation and diarrhea.  Endocrine: Negative for increased urination.  Genitourinary:  Negative for difficulty urinating.  Musculoskeletal:  Positive for joint pain and joint pain. Negative for joint swelling, myalgias, morning stiffness, muscle tenderness and myalgias.  Skin:  Positive for color change. Negative for rash and redness.  Allergic/Immunologic: Negative for susceptible to infections.   Neurological:  Positive for memory loss. Negative for dizziness, numbness, headaches and weakness.  Hematological:  Negative for bruising/bleeding tendency.  Psychiatric/Behavioral:  Negative for confusion.    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   Hyperuricemia 11/12/2016   Chronic gout of foot 11/09/2016   Age related osteoporosis 11/09/2016   Hypothyroidism 11/09/2016   Gastroesophageal reflux 11/09/2016   Chest pain 01/19/2016   DM type 2 (diabetes mellitus, type 2) (HCC) 01/19/2016   Leg cramp 12/12/2014   Pain, joint, multiple sites 09/08/2012   Night sweats 06/04/2012   Eyelid dermatitis, eczematous 04/30/2012   General medical examination 12/03/2011   URI 11/14/2010   FOOT, PAIN 04/19/2010   SINUSITIS 12/12/2009   HYPERKALEMIA 11/27/2009   SKIN LESION 07/27/2009   LEG CRAMPS, NOCTURNAL 01/26/2009   DEPRESSIVE DISORDER 10/20/2008   SCLERODERMA 07/11/2008   SEBORRHEIC KERATOSIS 03/08/2008   RHINITIS 12/02/2007   DIABETES MELLITUS, TYPE II 01/21/2007   Dyslipidemia 01/21/2007   Essential hypertension 01/21/2007    Past Medical History:  Diagnosis Date   Diabetes mellitus    GERD (gastroesophageal reflux disease)    Gout    Hyperlipidemia    Hypertension    Keratosis seborrheica    Macular degeneration of both eyes    Osteoporosis    Raynaud's disease    Rhinitis    Scleroderma (HCC)  Family History  Problem Relation Age of Onset   Stroke Mother    Heart attack Mother    Cancer Mother        breast cancer   Breast cancer Mother 46   Lung cancer Father    Achalasia Brother    Arrhythmia Brother    Arrhythmia Brother    Cancer Son 20       bladder cancer    Alzheimer's disease Paternal Aunt        "abusive"   Alzheimer's disease Paternal Aunt        "abusive"    Past Surgical History:  Procedure Laterality Date   BREAST EXCISIONAL BIOPSY Left 2005   CHOLECYSTECTOMY     TUBAL LIGATION     Social History   Social History Narrative   Lives at home with husband   Right handed   Caffeine: 1 cup/day   Immunization History  Administered Date(s) Administered   Influenza Split 10/10/2011, 09/08/2012, 08/31/2019, 09/29/2020   Influenza Whole 10/27/2007, 09/22/2008, 09/28/2009, 08/29/2010   Influenza,inj,Quad PF,6+ Mos 09/21/2013, 09/27/2014   PFIZER(Purple Top)SARS-COV-2 Vaccination 02/11/2020, 03/04/2020, 12/04/2020   Pneumococcal Polysaccharide-23 11/29/2010   Td 06/24/2002, 12/13/2013   Zoster, Live 07/11/2008     Objective: Vital Signs: BP (!) 162/81 (BP Location: Left Arm, Patient Position: Sitting, Cuff Size: Normal)   Pulse 85   Ht 5\' 7"  (1.702 m)   Wt 165 lb 12.8 oz (75.2 kg)   BMI 25.97 kg/m    Physical Exam Vitals and nursing note reviewed.  Constitutional:      Appearance: She is well-developed.  HENT:     Head: Normocephalic and atraumatic.  Eyes:     Conjunctiva/sclera: Conjunctivae normal.  Cardiovascular:     Rate and Rhythm: Normal rate and regular rhythm.     Heart sounds: Normal heart sounds.  Pulmonary:     Effort: Pulmonary effort is normal.     Breath sounds: Normal breath sounds.  Abdominal:     General: Bowel sounds are normal.     Palpations: Abdomen is soft.  Musculoskeletal:     Cervical back: Normal range of motion.  Lymphadenopathy:     Cervical: No cervical adenopathy.  Skin:    General: Skin is warm and dry.     Capillary Refill: Capillary refill takes less than 2 seconds.     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  Nailbed capillary dropout was noted.  No digital ulcers were noted.  Neurological:     Mental Status: She is alert and oriented to person, place, and time.  Psychiatric:        Behavior: Behavior normal.     Musculoskeletal Exam: C-spine was in good  range of motion.  Shoulders, elbows, wrist joints with good range of motion.  There was no synovitis over MCPs or PIPs.  Hip joints, knee joints, ankles, MTPs with good range of motion with no synovitis.  CDAI Exam: CDAI Score: -- Patient Global: --; Provider Global: -- Swollen: --; Tender: -- Joint Exam 08/07/2021   No joint exam has been documented for this visit   There is currently no information documented on the homunculus. Go to the Rheumatology activity and complete the homunculus joint exam.  Investigation: No additional findings.  Imaging: No results found.  Recent Labs: Lab Results  Component Value Date   WBC 4.8 02/10/2021   HGB 11.5 (L) 02/10/2021   PLT 234 02/10/2021   NA 135 04/19/2021   K 4.9 04/19/2021  CL 100 04/19/2021   CO2 21 04/19/2021   GLUCOSE 159 (H) 04/19/2021   BUN 13 04/19/2021   CREATININE 1.00 04/19/2021   BILITOT 0.5 02/08/2021   ALKPHOS 54 06/25/2017   AST 20 02/08/2021   ALT 20 02/08/2021   PROT 7.2 02/08/2021   ALBUMIN 3.8 06/25/2017   CALCIUM 9.6 04/19/2021   GFRAA 59 (L) 02/08/2021    Speciality Comments: Dexa May 2019: T-score -2.1 left femoral neck Dexa July 2016: T-score -2.5 left femoral neck  Procedures:  No procedures performed Allergies: Other   Assessment / Plan:     Visit Diagnoses: SCLERODERMA - Limited systemic sclerosis, sclerodactyly, Raynauds, Telengectesia's, positive ANA centromere, positive RF, positive CCP.  Sclerodactyly is distal to her wrist..  There is no worsening of sclerodactyly.  She states that Raynauds is not very active during the summer.  No digital ulcers were noted.  Nailbed capillary changes were noted.  Patient was evaluated by Dr. Gala Romney in April 2022.  Her echocardiogram was negative for pulmonary hypertension.  Raynaud's disease without gangrene-currently not active.  Idiopathic chronic gout of left foot without tophus - allopurinol 300 mg and colchicine 0.6 mg daily prn.  She has not had  a gout flare.- Plan: Uric acid  Hyperuricemia - uric acid: 02/08/2021 3.7  Age-related osteoporosis without current pathological fracture - DEXA  04/30/2018 BMD: 0.750, T-score: -2.1. July 2016 T score -2.5 left femoral neck.  She discontinued Fosamax per recommendations of her PCP according to pt  Postural kyphosis of thoracic region  Trochanteric bursitis of both hips-doing well.  She was advised to continue to do stretching.  Seborrheic keratosis  History of diabetes mellitus, type II  History of hypertension-blood pressure is elevated today.  Patient stated that her blood pressure is elevated because she was driving on [facility] which gives her stress.  I advised her to monitor blood pressure closely.  She is also on an ACE inhibitor.  I will contact Dr. Barbaraann Barthel office and see if she can switch from ACE inhibitor to a calcium channel blocker as ACE inhibitor should be reserved for the management of malignant hypertension, pain complication of scleroderma.  She would benefit from switching to amlodipine which will help her Raynauds as well.  I tried reaching Dr. Barbaraann Barthel office and she was not available today.  Have advised patient to contact Dr. Barbaraann Barthel to switch medication.  Medication monitoring encounter - Plan: CBC with Differential/Platelet, COMPLETE METABOLIC PANEL WITH GFR today and then every 6 months.  Orders: Orders Placed This Encounter  Procedures   CBC with Differential/Platelet   COMPLETE METABOLIC PANEL WITH GFR   Uric acid   No orders of the defined types were placed in this encounter.    Follow-Up Instructions: Return in about 6 months (around 02/07/2022) for Scleroderma, OA.   Pollyann Savoy, MD  Note - This record has been created using Animal nutritionist.  Chart creation errors have been sought, but may not always  have been located. Such creation errors do not reflect on  the standard of medical care.

## 2021-08-07 ENCOUNTER — Other Ambulatory Visit: Payer: Self-pay

## 2021-08-07 ENCOUNTER — Encounter: Payer: Self-pay | Admitting: Rheumatology

## 2021-08-07 ENCOUNTER — Ambulatory Visit: Payer: Medicare HMO | Admitting: Rheumatology

## 2021-08-07 VITALS — BP 162/81 | HR 85 | Ht 67.0 in | Wt 165.8 lb

## 2021-08-07 DIAGNOSIS — Z8679 Personal history of other diseases of the circulatory system: Secondary | ICD-10-CM

## 2021-08-07 DIAGNOSIS — L821 Other seborrheic keratosis: Secondary | ICD-10-CM

## 2021-08-07 DIAGNOSIS — M349 Systemic sclerosis, unspecified: Secondary | ICD-10-CM

## 2021-08-07 DIAGNOSIS — M4004 Postural kyphosis, thoracic region: Secondary | ICD-10-CM

## 2021-08-07 DIAGNOSIS — Z8639 Personal history of other endocrine, nutritional and metabolic disease: Secondary | ICD-10-CM | POA: Diagnosis not present

## 2021-08-07 DIAGNOSIS — Z5181 Encounter for therapeutic drug level monitoring: Secondary | ICD-10-CM | POA: Diagnosis not present

## 2021-08-07 DIAGNOSIS — M810 Age-related osteoporosis without current pathological fracture: Secondary | ICD-10-CM

## 2021-08-07 DIAGNOSIS — M7061 Trochanteric bursitis, right hip: Secondary | ICD-10-CM

## 2021-08-07 DIAGNOSIS — E790 Hyperuricemia without signs of inflammatory arthritis and tophaceous disease: Secondary | ICD-10-CM | POA: Diagnosis not present

## 2021-08-07 DIAGNOSIS — M7062 Trochanteric bursitis, left hip: Secondary | ICD-10-CM

## 2021-08-07 DIAGNOSIS — I7300 Raynaud's syndrome without gangrene: Secondary | ICD-10-CM

## 2021-08-07 DIAGNOSIS — M1A0720 Idiopathic chronic gout, left ankle and foot, without tophus (tophi): Secondary | ICD-10-CM

## 2021-08-07 LAB — COMPLETE METABOLIC PANEL WITH GFR
AG Ratio: 1.3 (calc) (ref 1.0–2.5)
ALT: 17 U/L (ref 6–29)
AST: 19 U/L (ref 10–35)
Albumin: 4.1 g/dL (ref 3.6–5.1)
Alkaline phosphatase (APISO): 70 U/L (ref 37–153)
BUN: 13 mg/dL (ref 7–25)
CO2: 25 mmol/L (ref 20–32)
Calcium: 9.6 mg/dL (ref 8.6–10.4)
Chloride: 99 mmol/L (ref 98–110)
Creat: 1 mg/dL (ref 0.60–1.00)
Globulin: 3.2 g/dL (calc) (ref 1.9–3.7)
Glucose, Bld: 173 mg/dL — ABNORMAL HIGH (ref 65–99)
Potassium: 5 mmol/L (ref 3.5–5.3)
Sodium: 132 mmol/L — ABNORMAL LOW (ref 135–146)
Total Bilirubin: 0.4 mg/dL (ref 0.2–1.2)
Total Protein: 7.3 g/dL (ref 6.1–8.1)
eGFR: 58 mL/min/{1.73_m2} — ABNORMAL LOW (ref >=60)

## 2021-08-07 LAB — CBC WITH DIFFERENTIAL/PLATELET
Absolute Monocytes: 630 cells/uL (ref 200–950)
Basophils Absolute: 12 cells/uL (ref 0–200)
Basophils Relative: 0.2 %
Eosinophils Absolute: 48 cells/uL (ref 15–500)
Eosinophils Relative: 0.8 %
HCT: 36.4 % (ref 35.0–45.0)
Hemoglobin: 11.2 g/dL — ABNORMAL LOW (ref 11.7–15.5)
Lymphs Abs: 1770 cells/uL (ref 850–3900)
MCH: 24 pg — ABNORMAL LOW (ref 27.0–33.0)
MCHC: 30.8 g/dL — ABNORMAL LOW (ref 32.0–36.0)
MCV: 78.1 fL — ABNORMAL LOW (ref 80.0–100.0)
MPV: 11.9 fL (ref 7.5–12.5)
Monocytes Relative: 10.5 %
Neutro Abs: 3540 cells/uL (ref 1500–7800)
Neutrophils Relative %: 59 %
Platelets: 297 10*3/uL (ref 140–400)
RBC: 4.66 10*6/uL (ref 3.80–5.10)
RDW: 16.3 % — ABNORMAL HIGH (ref 11.0–15.0)
Total Lymphocyte: 29.5 %
WBC: 6 10*3/uL (ref 3.8–10.8)

## 2021-08-07 LAB — URIC ACID: Uric Acid, Serum: 3.2 mg/dL (ref 2.5–7.0)

## 2021-08-07 NOTE — Patient Instructions (Addendum)
Cardiologist: Dr. Gala Romney [phone] Pulmonologist: Dr.Mannam [phone]) [phone]  Vaccines You are taking a medication(s) that can suppress your immune system.  The following immunizations are recommended: Flu annually Covid-19  Td/Tdap (tetanus, diphtheria, pertussis) every 10 years Pneumonia (Prevnar 15 then Pneumovax 23 at least 1 year apart.  Alternatively, can take Prevnar 20 without needing additional dose) Shingrix (after age 76): 2 doses from 4 weeks to 6 months apart  Please check with your PCP to make sure you are up to date.   Heart Disease Prevention   Your inflammatory disease increases your risk of heart disease which includes heart attack, stroke, atrial fibrillation (irregular heartbeats), high blood pressure, heart failure and atherosclerosis (plaque in the arteries).  It is important to reduce your risk by:   Keep blood pressure, cholesterol, and blood sugar at healthy levels   Smoking Cessation   Maintain a healthy weight  BMI 20-25   Eat a healthy diet  Plenty of fresh fruit, vegetables, and whole grains  Limit saturated fats, foods high in sodium, and added sugars  DASH and Mediterranean diet   Increase physical activity  Recommend moderate physically activity for 150 minutes per week/ 30 minutes a day for five days a week These can be broken up into three separate ten-minute sessions during the day.   Reduce Stress  Meditation, slow breathing exercises, yoga, coloring books  Dental visits twice a year

## 2021-08-08 ENCOUNTER — Telehealth: Payer: Self-pay

## 2021-08-08 NOTE — Telephone Encounter (Signed)
Patient advised Dr. Corliss Skains recommends switching from lisinopril to amlodipine, so she should schedule a follow up visit with her PCP to further discuss.

## 2021-08-08 NOTE — Progress Notes (Signed)
Mild anemia with low MCV was noted.  Please advise patient to take multivitamin with iron.  Glucose is elevated.  GFR is low and stable.  Uric acid is normal.  Please forward labs to her PCP.
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
## 2021-08-14 ENCOUNTER — Telehealth: Payer: Self-pay | Admitting: Pharmacist

## 2021-08-14 ENCOUNTER — Telehealth: Payer: Self-pay | Admitting: Rheumatology

## 2021-08-14 NOTE — Telephone Encounter (Signed)
Patient had OV on 08/07/21 and completed [employer] patient assistance application for [employer]. She brought income documents with her. We reviewed that she is approved through 12/29/21 and it is too early to submit renewal application in August  Will have provider portion completed and place in pending PAP folder at pharmacy office. Will send copy to scan center as it is too early to submit application to company.  Chesley Mires, PharmD, MPH, BCPS Clinical Pharmacist (Rheumatology and Pulmonology)

## 2021-08-14 NOTE — Telephone Encounter (Signed)
I received a phone call from Dr. Barbaraann Barthel.  I discussed the reason to take her off the ACE inhibitor in case we have to use ACE inhibitor for malignant hypertension in the future.  Dr. Barbaraann Barthel mentioned that she was using ACE inhibitor because of the history of diabetes.  She will switch patient to another antihypertensive medication. Pollyann Savoy, MD

## 2021-08-23 NOTE — Telephone Encounter (Signed)
Placed signed provider ptroion, signed patient portion, med list, income docs, and insurance card copy in pending info folder at pharmacy office. Will submit in early November 2022 as patient is already approved through 12/29/21  Chesley Mires, PharmD, MPH, BCPS Clinical Pharmacist (Rheumatology and Pulmonology)

## 2021-08-28 ENCOUNTER — Other Ambulatory Visit: Payer: Self-pay | Admitting: *Deleted

## 2021-08-28 MED ORDER — COLCHICINE 0.6 MG PO TABS: 0.6000 mg | ORAL_TABLET | Freq: Every day | ORAL | 0 refills | Status: DC

## 2021-08-28 NOTE — Telephone Encounter (Signed)
Received a call from Darlene from [place] [facility] [employer] requesting refill on [facility] for patient.  Fax number to fax prescription: [phone]  Next Visit: 02/07/2021  Last Visit: 08/07/2021  DX: Idiopathic chronic gout of left foot without tophus  Current Dose per office note 08/07/2021: colchicine 0.6 mg daily prn  Labs: 08/07/2021 Mild anemia with low MCV was noted.  Please advise patient to take multivitamin with iron.  Glucose is elevated.  GFR is low and stable.  Uric acid is normal  Okay to refill [employer]?

## 2021-08-30 DIAGNOSIS — S060XAA Concussion with loss of consciousness status unknown, initial encounter: Secondary | ICD-10-CM

## 2021-08-30 DIAGNOSIS — I609 Nontraumatic subarachnoid hemorrhage, unspecified: Secondary | ICD-10-CM

## 2021-08-30 HISTORY — DX: Concussion with loss of consciousness status unknown, initial encounter: S06.0XAA

## 2021-08-30 HISTORY — DX: Nontraumatic subarachnoid hemorrhage, unspecified: I60.9

## 2021-09-12 DIAGNOSIS — E1169 Type 2 diabetes mellitus with other specified complication: Secondary | ICD-10-CM | POA: Diagnosis not present

## 2021-09-12 DIAGNOSIS — E039 Hypothyroidism, unspecified: Secondary | ICD-10-CM | POA: Diagnosis not present

## 2021-09-12 DIAGNOSIS — I10 Essential (primary) hypertension: Secondary | ICD-10-CM | POA: Diagnosis not present

## 2021-09-21 DIAGNOSIS — I609 Nontraumatic subarachnoid hemorrhage, unspecified: Secondary | ICD-10-CM | POA: Diagnosis not present

## 2021-09-21 DIAGNOSIS — K219 Gastro-esophageal reflux disease without esophagitis: Secondary | ICD-10-CM | POA: Diagnosis not present

## 2021-09-21 DIAGNOSIS — E7800 Pure hypercholesterolemia, unspecified: Secondary | ICD-10-CM | POA: Diagnosis not present

## 2021-09-21 DIAGNOSIS — E1169 Type 2 diabetes mellitus with other specified complication: Secondary | ICD-10-CM | POA: Diagnosis not present

## 2021-09-21 DIAGNOSIS — E785 Hyperlipidemia, unspecified: Secondary | ICD-10-CM | POA: Diagnosis not present

## 2021-09-21 DIAGNOSIS — I10 Essential (primary) hypertension: Secondary | ICD-10-CM | POA: Diagnosis not present

## 2021-09-21 DIAGNOSIS — E039 Hypothyroidism, unspecified: Secondary | ICD-10-CM | POA: Diagnosis not present

## 2021-09-26 ENCOUNTER — Encounter (HOSPITAL_COMMUNITY): Payer: Self-pay

## 2021-09-26 ENCOUNTER — Emergency Department (HOSPITAL_COMMUNITY): Payer: Medicare HMO

## 2021-09-26 ENCOUNTER — Emergency Department (HOSPITAL_COMMUNITY)
Admission: EM | Admit: 2021-09-26 | Discharge: 2021-09-26 | Disposition: A | Discharge status: Home / Self Care | Payer: Medicare HMO | Attending: Emergency Medicine | Admitting: Emergency Medicine

## 2021-09-26 DIAGNOSIS — I609 Nontraumatic subarachnoid hemorrhage, unspecified: Secondary | ICD-10-CM

## 2021-09-26 DIAGNOSIS — Y92410 Unspecified street and highway as the place of occurrence of the external cause: Secondary | ICD-10-CM | POA: Insufficient documentation

## 2021-09-26 DIAGNOSIS — S62317A Displaced fracture of base of fifth metacarpal bone. left hand, initial encounter for closed fracture: Secondary | ICD-10-CM | POA: Diagnosis not present

## 2021-09-26 DIAGNOSIS — Z7982 Long term (current) use of aspirin: Secondary | ICD-10-CM | POA: Diagnosis not present

## 2021-09-26 DIAGNOSIS — E039 Hypothyroidism, unspecified: Secondary | ICD-10-CM | POA: Insufficient documentation

## 2021-09-26 DIAGNOSIS — S51812A Laceration without foreign body of left forearm, initial encounter: Secondary | ICD-10-CM | POA: Insufficient documentation

## 2021-09-26 DIAGNOSIS — K219 Gastro-esophageal reflux disease without esophagitis: Secondary | ICD-10-CM | POA: Diagnosis not present

## 2021-09-26 DIAGNOSIS — S6292XA Unspecified fracture of left wrist and hand, initial encounter for closed fracture: Secondary | ICD-10-CM

## 2021-09-26 DIAGNOSIS — I6523 Occlusion and stenosis of bilateral carotid arteries: Secondary | ICD-10-CM | POA: Diagnosis not present

## 2021-09-26 DIAGNOSIS — I10 Essential (primary) hypertension: Secondary | ICD-10-CM | POA: Diagnosis not present

## 2021-09-26 DIAGNOSIS — S0990XA Unspecified injury of head, initial encounter: Secondary | ICD-10-CM | POA: Diagnosis not present

## 2021-09-26 DIAGNOSIS — M189 Osteoarthritis of first carpometacarpal joint, unspecified: Secondary | ICD-10-CM | POA: Diagnosis not present

## 2021-09-26 DIAGNOSIS — R0789 Other chest pain: Secondary | ICD-10-CM | POA: Insufficient documentation

## 2021-09-26 DIAGNOSIS — Z20822 Contact with and (suspected) exposure to covid-19: Secondary | ICD-10-CM | POA: Diagnosis not present

## 2021-09-26 DIAGNOSIS — K760 Fatty (change of) liver, not elsewhere classified: Secondary | ICD-10-CM | POA: Diagnosis not present

## 2021-09-26 DIAGNOSIS — S066X0A Traumatic subarachnoid hemorrhage without loss of consciousness, initial encounter: Secondary | ICD-10-CM | POA: Insufficient documentation

## 2021-09-26 DIAGNOSIS — I700 Atherosclerosis of aorta: Secondary | ICD-10-CM | POA: Insufficient documentation

## 2021-09-26 DIAGNOSIS — Z7984 Long term (current) use of oral hypoglycemic drugs: Secondary | ICD-10-CM | POA: Insufficient documentation

## 2021-09-26 DIAGNOSIS — E119 Type 2 diabetes mellitus without complications: Secondary | ICD-10-CM | POA: Insufficient documentation

## 2021-09-26 DIAGNOSIS — Z23 Encounter for immunization: Secondary | ICD-10-CM | POA: Diagnosis not present

## 2021-09-26 DIAGNOSIS — K429 Umbilical hernia without obstruction or gangrene: Secondary | ICD-10-CM | POA: Diagnosis not present

## 2021-09-26 DIAGNOSIS — R739 Hyperglycemia, unspecified: Secondary | ICD-10-CM | POA: Diagnosis not present

## 2021-09-26 DIAGNOSIS — Z79899 Other long term (current) drug therapy: Secondary | ICD-10-CM | POA: Insufficient documentation

## 2021-09-26 DIAGNOSIS — S6992XA Unspecified injury of left wrist, hand and finger(s), initial encounter: Secondary | ICD-10-CM | POA: Diagnosis present

## 2021-09-26 DIAGNOSIS — R079 Chest pain, unspecified: Secondary | ICD-10-CM | POA: Diagnosis not present

## 2021-09-26 DIAGNOSIS — Z743 Need for continuous supervision: Secondary | ICD-10-CM | POA: Diagnosis not present

## 2021-09-26 DIAGNOSIS — S62327A Displaced fracture of shaft of fifth metacarpal bone, left hand, initial encounter for closed fracture: Secondary | ICD-10-CM | POA: Diagnosis not present

## 2021-09-26 DIAGNOSIS — Z041 Encounter for examination and observation following transport accident: Secondary | ICD-10-CM | POA: Diagnosis not present

## 2021-09-26 LAB — CBC WITH DIFFERENTIAL/PLATELET
Abs Immature Granulocytes: 0.06 10*3/uL (ref 0.00–0.07)
Basophils Absolute: 0 10*3/uL (ref 0.0–0.1)
Basophils Relative: 0 %
Eosinophils Absolute: 0 10*3/uL (ref 0.0–0.5)
Eosinophils Relative: 0 %
HCT: 37.2 % (ref 36.0–46.0)
Hemoglobin: 11.8 g/dL — ABNORMAL LOW (ref 12.0–15.0)
Immature Granulocytes: 1 %
Lymphocytes Relative: 17 %
Lymphs Abs: 1.9 10*3/uL (ref 0.7–4.0)
MCH: 25 pg — ABNORMAL LOW (ref 26.0–34.0)
MCHC: 31.7 g/dL (ref 30.0–36.0)
MCV: 78.8 fL — ABNORMAL LOW (ref 80.0–100.0)
Monocytes Absolute: 0.7 10*3/uL (ref 0.1–1.0)
Monocytes Relative: 6 %
Neutro Abs: 8.9 10*3/uL — ABNORMAL HIGH (ref 1.7–7.7)
Neutrophils Relative %: 76 %
Platelets: 256 10*3/uL (ref 150–400)
RBC: 4.72 MIL/uL (ref 3.87–5.11)
RDW: 18.6 % — ABNORMAL HIGH (ref 11.5–15.5)
WBC: 11.6 10*3/uL — ABNORMAL HIGH (ref 4.0–10.5)
nRBC: 0 % (ref 0.0–0.2)

## 2021-09-26 LAB — BASIC METABOLIC PANEL
Anion gap: 9 (ref 5–15)
BUN: 17 mg/dL (ref 8–23)
CO2: 23 mmol/L (ref 22–32)
Calcium: 9.2 mg/dL (ref 8.9–10.3)
Chloride: 100 mmol/L (ref 98–111)
Creatinine, Ser: 0.98 mg/dL (ref 0.44–1.00)
GFR, Estimated: 60 mL/min — ABNORMAL LOW (ref >60)
Glucose, Bld: 142 mg/dL — ABNORMAL HIGH (ref 70–99)
Potassium: 4.3 mmol/L (ref 3.5–5.1)
Sodium: 132 mmol/L — ABNORMAL LOW (ref 135–145)

## 2021-09-26 LAB — RESP PANEL BY RT-PCR (FLU A&B, COVID) ARPGX2
Influenza A by PCR: NEGATIVE
Influenza B by PCR: NEGATIVE
SARS Coronavirus 2 by RT PCR: NEGATIVE

## 2021-09-26 LAB — HEPATIC FUNCTION PANEL
ALT: 21 U/L (ref 0–44)
AST: 24 U/L (ref 15–41)
Albumin: 4.3 g/dL (ref 3.5–5.0)
Alkaline Phosphatase: 76 U/L (ref 38–126)
Bilirubin, Direct: 0.1 mg/dL (ref 0.0–0.2)
Indirect Bilirubin: 0.7 mg/dL (ref 0.3–0.9)
Total Bilirubin: 0.8 mg/dL (ref 0.3–1.2)
Total Protein: 7.5 g/dL (ref 6.5–8.1)

## 2021-09-26 MED ORDER — MORPHINE SULFATE (PF) 2 MG/ML IV SOLN
2.0000 mg | Freq: Once | INTRAVENOUS | Status: AC
  Administered 2021-09-26: 2 mg via INTRAVENOUS
  Filled 2021-09-26: qty 1

## 2021-09-26 MED ORDER — ACETAMINOPHEN 325 MG PO TABS
650.0000 mg | ORAL_TABLET | Freq: Once | ORAL | Status: AC
  Administered 2021-09-26: 650 mg via ORAL
  Filled 2021-09-26: qty 2

## 2021-09-26 MED ORDER — IOHEXOL 350 MG/ML SOLN
80.0000 mL | Freq: Once | INTRAVENOUS | Status: AC | PRN
  Administered 2021-09-26: 80 mL via INTRAVENOUS

## 2021-09-26 MED ORDER — TETANUS-DIPHTH-ACELL PERTUSSIS 5-2.5-18.5 LF-MCG/0.5 IM SUSY
0.5000 mL | PREFILLED_SYRINGE | Freq: Once | INTRAMUSCULAR | Status: AC
  Administered 2021-09-26: 0.5 mL via INTRAMUSCULAR
  Filled 2021-09-26: qty 0.5

## 2021-09-26 MED ORDER — OXYCODONE-ACETAMINOPHEN 5-325 MG PO TABS: 1.0000 | ORAL_TABLET | Freq: Three times a day (TID) | ORAL | 0 refills | Status: AC | PRN
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
## 2021-09-26 NOTE — Progress Notes (Signed)
Orthopedic Tech Progress Note Patient Details:  Andrea Heath 08-04-1946 [id number]  Ortho Devices Type of Ortho Device: Ulna gutter splint Ortho Device/Splint Location: Left hand Ortho Device/Splint Interventions: Application   Post Interventions Patient Tolerated: Well  Genelle Bal Shulem Mader 09/26/2021, 3:33 PM
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
## 2021-09-27 DIAGNOSIS — S6990XA Unspecified injury of unspecified wrist, hand and finger(s), initial encounter: Secondary | ICD-10-CM | POA: Diagnosis not present

## 2021-09-27 DIAGNOSIS — I609 Nontraumatic subarachnoid hemorrhage, unspecified: Secondary | ICD-10-CM | POA: Diagnosis not present

## 2021-09-28 DIAGNOSIS — S62317A Displaced fracture of base of fifth metacarpal bone. left hand, initial encounter for closed fracture: Secondary | ICD-10-CM | POA: Diagnosis not present

## 2021-09-28 DIAGNOSIS — S62307A Unspecified fracture of fifth metacarpal bone, left hand, initial encounter for closed fracture: Secondary | ICD-10-CM | POA: Insufficient documentation

## 2021-09-28 HISTORY — DX: Unspecified fracture of fifth metacarpal bone, left hand, initial encounter for closed fracture: S62.307A

## 2021-10-01 DIAGNOSIS — S62317A Displaced fracture of base of fifth metacarpal bone. left hand, initial encounter for closed fracture: Secondary | ICD-10-CM | POA: Diagnosis not present

## 2021-10-01 DIAGNOSIS — S62327A Displaced fracture of shaft of fifth metacarpal bone, left hand, initial encounter for closed fracture: Secondary | ICD-10-CM | POA: Diagnosis not present

## 2021-10-01 HISTORY — PX: HAND SURGERY: SHX662

## 2021-10-04 ENCOUNTER — Telehealth: Payer: Self-pay | Admitting: Neurology

## 2021-10-04 DIAGNOSIS — R413 Other amnesia: Secondary | ICD-10-CM

## 2021-10-04 NOTE — Telephone Encounter (Signed)
Spoke with patient's husband. Discussed the message below by Dr. Lucia Gaskins which included that patient had declined to do formal memory testing and f/u with Dr Lucia Gaskins. However if she is ready for memory testing we can refer to Dr Roseanne Reno here in [place], who is excellent.  Patient's husband verbalized understanding and agreed to proceed with a referral to Dr. Roseanne Reno.  He verbalized appreciation for the call.  Referral order placed.
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
## 2021-10-05 ENCOUNTER — Encounter: Payer: Self-pay | Admitting: Psychology

## 2021-10-11 DIAGNOSIS — S62317A Displaced fracture of base of fifth metacarpal bone. left hand, initial encounter for closed fracture: Secondary | ICD-10-CM | POA: Diagnosis not present

## 2021-10-11 DIAGNOSIS — M25642 Stiffness of left hand, not elsewhere classified: Secondary | ICD-10-CM | POA: Diagnosis not present

## 2021-10-16 DIAGNOSIS — M25642 Stiffness of left hand, not elsewhere classified: Secondary | ICD-10-CM | POA: Diagnosis not present

## 2021-10-23 DIAGNOSIS — M25642 Stiffness of left hand, not elsewhere classified: Secondary | ICD-10-CM | POA: Diagnosis not present

## 2021-10-26 DIAGNOSIS — I609 Nontraumatic subarachnoid hemorrhage, unspecified: Secondary | ICD-10-CM | POA: Diagnosis not present

## 2021-10-26 DIAGNOSIS — I10 Essential (primary) hypertension: Secondary | ICD-10-CM | POA: Diagnosis not present

## 2021-10-26 DIAGNOSIS — E1169 Type 2 diabetes mellitus with other specified complication: Secondary | ICD-10-CM | POA: Diagnosis not present

## 2021-10-26 DIAGNOSIS — E039 Hypothyroidism, unspecified: Secondary | ICD-10-CM | POA: Diagnosis not present

## 2021-10-26 DIAGNOSIS — K219 Gastro-esophageal reflux disease without esophagitis: Secondary | ICD-10-CM | POA: Diagnosis not present

## 2021-10-26 DIAGNOSIS — E7800 Pure hypercholesterolemia, unspecified: Secondary | ICD-10-CM | POA: Diagnosis not present

## 2021-10-26 DIAGNOSIS — E785 Hyperlipidemia, unspecified: Secondary | ICD-10-CM | POA: Diagnosis not present

## 2021-10-30 DIAGNOSIS — M25642 Stiffness of left hand, not elsewhere classified: Secondary | ICD-10-CM | POA: Diagnosis not present

## 2021-11-01 DIAGNOSIS — S62317A Displaced fracture of base of fifth metacarpal bone. left hand, initial encounter for closed fracture: Secondary | ICD-10-CM | POA: Diagnosis not present

## 2021-11-01 DIAGNOSIS — Z4789 Encounter for other orthopedic aftercare: Secondary | ICD-10-CM | POA: Diagnosis not present

## 2021-11-06 DIAGNOSIS — M25642 Stiffness of left hand, not elsewhere classified: Secondary | ICD-10-CM | POA: Diagnosis not present

## 2021-11-07 NOTE — Telephone Encounter (Signed)
Submitted Patient Assistance RENEWAL Application to [employer] for [employer]  along with provider portion, patient portion, insurance card copy, med list, and income documents. Will update patient when we receive a response.  Fax# [phone] Phone# [phone]  Chesley Mires, PharmD, MPH, BCPS Clinical Pharmacist (Rheumatology and Pulmonology)

## 2021-11-13 DIAGNOSIS — M25642 Stiffness of left hand, not elsewhere classified: Secondary | ICD-10-CM | POA: Diagnosis not present

## 2021-11-14 NOTE — Telephone Encounter (Signed)
Dates on renewal application for [employer] P[employer] were incorrect (dated at 2023). Refaxed entire application with corrected dates  Fax# [phone] Phone# [phone]   Chesley Mires, PharmD, MPH, BCPS Clinical Pharmacist (Rheumatology and Pulmonology)

## 2021-11-20 DIAGNOSIS — M25642 Stiffness of left hand, not elsewhere classified: Secondary | ICD-10-CM | POA: Diagnosis not present

## 2021-11-27 DIAGNOSIS — M25642 Stiffness of left hand, not elsewhere classified: Secondary | ICD-10-CM | POA: Diagnosis not present

## 2021-11-27 DIAGNOSIS — E039 Hypothyroidism, unspecified: Secondary | ICD-10-CM | POA: Diagnosis not present

## 2021-11-30 DIAGNOSIS — Z4789 Encounter for other orthopedic aftercare: Secondary | ICD-10-CM | POA: Diagnosis not present

## 2021-11-30 DIAGNOSIS — S62317A Displaced fracture of base of fifth metacarpal bone. left hand, initial encounter for closed fracture: Secondary | ICD-10-CM | POA: Diagnosis not present

## 2021-12-04 DIAGNOSIS — M25642 Stiffness of left hand, not elsewhere classified: Secondary | ICD-10-CM | POA: Diagnosis not present

## 2021-12-11 DIAGNOSIS — M25642 Stiffness of left hand, not elsewhere classified: Secondary | ICD-10-CM | POA: Diagnosis not present

## 2021-12-11 NOTE — Telephone Encounter (Signed)
Called [employer] for update on patient's application  Received a verbal confirmation from [place] regarding an approval for [employer] patient assistance from 11/15/21 to 12/15/22.   Phone number: [phone]  Chesley Mires, PharmD, MPH, BCPS Clinical Pharmacist (Rheumatology and Pulmonology)

## 2021-12-18 DIAGNOSIS — M25642 Stiffness of left hand, not elsewhere classified: Secondary | ICD-10-CM | POA: Diagnosis not present

## 2021-12-20 ENCOUNTER — Other Ambulatory Visit: Payer: Self-pay | Admitting: Family Medicine

## 2021-12-20 DIAGNOSIS — R519 Headache, unspecified: Secondary | ICD-10-CM

## 2021-12-20 DIAGNOSIS — I609 Nontraumatic subarachnoid hemorrhage, unspecified: Secondary | ICD-10-CM

## 2022-01-01 DIAGNOSIS — M25642 Stiffness of left hand, not elsewhere classified: Secondary | ICD-10-CM | POA: Diagnosis not present

## 2022-01-01 DIAGNOSIS — S62317A Displaced fracture of base of fifth metacarpal bone. left hand, initial encounter for closed fracture: Secondary | ICD-10-CM | POA: Diagnosis not present

## 2022-01-08 DIAGNOSIS — M25642 Stiffness of left hand, not elsewhere classified: Secondary | ICD-10-CM | POA: Diagnosis not present

## 2022-01-15 DIAGNOSIS — E039 Hypothyroidism, unspecified: Secondary | ICD-10-CM | POA: Diagnosis not present

## 2022-01-15 DIAGNOSIS — I7300 Raynaud's syndrome without gangrene: Secondary | ICD-10-CM | POA: Diagnosis not present

## 2022-01-15 DIAGNOSIS — E1169 Type 2 diabetes mellitus with other specified complication: Secondary | ICD-10-CM | POA: Diagnosis not present

## 2022-01-15 DIAGNOSIS — M349 Systemic sclerosis, unspecified: Secondary | ICD-10-CM | POA: Diagnosis not present

## 2022-01-15 DIAGNOSIS — R519 Headache, unspecified: Secondary | ICD-10-CM | POA: Diagnosis not present

## 2022-01-15 DIAGNOSIS — R011 Cardiac murmur, unspecified: Secondary | ICD-10-CM | POA: Diagnosis not present

## 2022-01-15 DIAGNOSIS — M25642 Stiffness of left hand, not elsewhere classified: Secondary | ICD-10-CM | POA: Diagnosis not present

## 2022-01-15 DIAGNOSIS — E7800 Pure hypercholesterolemia, unspecified: Secondary | ICD-10-CM | POA: Diagnosis not present

## 2022-01-15 DIAGNOSIS — I10 Essential (primary) hypertension: Secondary | ICD-10-CM | POA: Diagnosis not present

## 2022-01-15 DIAGNOSIS — R5383 Other fatigue: Secondary | ICD-10-CM | POA: Diagnosis not present

## 2022-01-15 DIAGNOSIS — M109 Gout, unspecified: Secondary | ICD-10-CM | POA: Diagnosis not present

## 2022-01-16 ENCOUNTER — Other Ambulatory Visit: Payer: Self-pay | Admitting: Family Medicine

## 2022-01-16 ENCOUNTER — Other Ambulatory Visit: Payer: Self-pay

## 2022-01-16 ENCOUNTER — Ambulatory Visit
Admission: RE | Admit: 2022-01-16 | Discharge: 2022-01-16 | Disposition: A | Discharge status: Home / Self Care | Payer: Medicare HMO | Source: Ambulatory Visit | Attending: Family Medicine | Admitting: Family Medicine

## 2022-01-16 DIAGNOSIS — I609 Nontraumatic subarachnoid hemorrhage, unspecified: Secondary | ICD-10-CM

## 2022-01-16 DIAGNOSIS — M810 Age-related osteoporosis without current pathological fracture: Secondary | ICD-10-CM

## 2022-01-16 DIAGNOSIS — I611 Nontraumatic intracerebral hemorrhage in hemisphere, cortical: Secondary | ICD-10-CM | POA: Diagnosis not present

## 2022-01-16 DIAGNOSIS — J329 Chronic sinusitis, unspecified: Secondary | ICD-10-CM | POA: Diagnosis not present

## 2022-01-16 DIAGNOSIS — S066X0A Traumatic subarachnoid hemorrhage without loss of consciousness, initial encounter: Secondary | ICD-10-CM | POA: Diagnosis not present

## 2022-01-16 DIAGNOSIS — R519 Headache, unspecified: Secondary | ICD-10-CM

## 2022-01-16 DIAGNOSIS — G319 Degenerative disease of nervous system, unspecified: Secondary | ICD-10-CM | POA: Diagnosis not present

## 2022-01-17 ENCOUNTER — Telehealth: Payer: Self-pay

## 2022-01-17 ENCOUNTER — Encounter: Payer: Self-pay | Admitting: *Deleted

## 2022-01-17 ENCOUNTER — Telehealth: Payer: Self-pay | Admitting: *Deleted
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
## 2022-01-17 NOTE — Telephone Encounter (Signed)
Patient contacted the office and left a voicemail stating she had a question about her appointment scheduled for 02/07/2022.  Attempted to contact the patient and left message for patient to call the office.

## 2022-01-18 ENCOUNTER — Encounter: Payer: Self-pay | Admitting: Neurology

## 2022-01-18 ENCOUNTER — Ambulatory Visit (INDEPENDENT_AMBULATORY_CARE_PROVIDER_SITE_OTHER): Payer: Medicare HMO | Admitting: Neurology

## 2022-01-18 ENCOUNTER — Other Ambulatory Visit: Payer: Self-pay

## 2022-01-18 ENCOUNTER — Ambulatory Visit: Payer: Medicare HMO | Admitting: Interventional Cardiology

## 2022-01-18 ENCOUNTER — Encounter: Payer: Self-pay | Admitting: Interventional Cardiology

## 2022-01-18 ENCOUNTER — Telehealth (HOSPITAL_COMMUNITY): Payer: Self-pay | Admitting: Interventional Cardiology

## 2022-01-18 VITALS — BP 138/72 | HR 84 | Ht 67.0 in | Wt 156.8 lb

## 2022-01-18 VITALS — BP 142/72 | HR 91 | Ht 67.0 in | Wt 157.4 lb

## 2022-01-18 DIAGNOSIS — G44309 Post-traumatic headache, unspecified, not intractable: Secondary | ICD-10-CM

## 2022-01-18 DIAGNOSIS — R69 Illness, unspecified: Secondary | ICD-10-CM | POA: Diagnosis not present

## 2022-01-18 DIAGNOSIS — I342 Nonrheumatic mitral (valve) stenosis: Secondary | ICD-10-CM | POA: Diagnosis not present

## 2022-01-18 DIAGNOSIS — M349 Systemic sclerosis, unspecified: Secondary | ICD-10-CM | POA: Diagnosis not present

## 2022-01-18 DIAGNOSIS — R011 Cardiac murmur, unspecified: Secondary | ICD-10-CM | POA: Diagnosis not present

## 2022-01-18 DIAGNOSIS — F419 Anxiety disorder, unspecified: Secondary | ICD-10-CM
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
## 2022-01-18 NOTE — Telephone Encounter (Signed)
Returned call to patient and she placed her husband on the phone. He states that they only needed to clarify the appointment date and time. He states they did not need a return call.
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
## 2022-01-18 NOTE — Patient Instructions (Addendum)
[facility] [employer] [facility] in [place]: [phone]  [facility]: [phone] ** refer you here [facility]: [phone]  [facility]: [phone] or [phone]     Post-Concussion Syndrome A concussion is a brain injury. Post-concussion syndrome is when symptoms last longer than normal after a head injury. What are the causes? The cause of this condition is not known. It can happen if your head injury was mild or very bad. What increases the risk? Being female. Being young. Having had a head injury before. Being sad (depressed) or feeling worried or nervous (having anxiety). Fainting when you got your concussion or not being able to remember it. Having many symptoms or very bad symptoms at the time of your injury. What are the signs or symptoms? After a head injury, you may have physical symptoms, such as: Having headaches. Feeling tired. Feeling dizzy. Feeling weak. Having trouble seeing. Having trouble in bright lights. Having trouble hearing. Having problems balancing. You may also have mental and emotional symptoms, such as: Not being able to remember things. Not being able to focus. Having trouble sleeping. Feeling grouchy (irritable). Feeling worried. Feeling sad. Having trouble learning new things. These can last from weeks to months. How is this treated? Treatment for this condition may depend on your symptoms. Symptoms usually go away on their own over time. If you need treatment, it may include: Taking medicines. Resting your brain and body for a few days. Doing therapy to help you recover (rehabilitation therapy). This may include: Physical or occupational therapy. This may include exercises. Talking to a counselor. Speech therapy. Therapy to help your eyes. Follow these instructions at home: Medicines Take all medicines only as told by your doctor. Avoid pain medicines that have opioids in them. Ask your  doctor which pain medicine to take. Activity Limit activities as told by your doctor. This may include not doing the following: Homework. Job-related work. Hard thinking. Watching TV. Using a computer or phone. Puzzles. Exercise. Sports. Slowly return to your normal activity as told by your doctor. Stop an activity if you have symptoms. Rest. Try to: Sleep 7-9 hours each night. Take naps or breaks when you feel tired during the day. Do not do anything that may cause you to get hurt again right away. General instructions  Do not drink alcohol until your doctor says that you can. Keep track of your symptoms. Keep all follow-up visits as told by your doctor. This is important. Contact a doctor if: You do not improve. You get worse. You have another injury. Get help right away if: You have a very bad headache or a headache that gets worse. You feel confused. You feel very sleepy. You faint. You vomit. You feel weak in any part of your body. You feel numb in any part of your body. You start shaking (have a seizure). You have trouble talking. Summary This condition is when symptoms last longer than normal after a head injury. Limit your activities after your injury. Slowly return to normal activities as told by your doctor. Rest. Do not drink alcohol, and avoid pain medicines that have opioids in them. Call your doctor if your symptoms get worse. This information is not intended to replace advice given to you by your health care provider. Make sure you discuss any questions you have with your health care provider. Document Revised: 03/01/2021 Document Reviewed: 03/01/2021 Elsevier Patient Education  2022 Elsevier Inc.    Managing Post-Traumatic Stress Disorder If you have been diagnosed with  post-traumatic stress disorder (PTSD), you may be relieved that you now know why you have felt or behaved a certain way. Still, you may feel overwhelmed about the treatment ahead. You  may also wonder how to get the support you need and how to deal with the condition day-to-day. If you are living with PTSD, there are ways to help you recover from it and manage your symptoms. How to manage lifestyle changes Managing stress Stress is your body's reaction to life changes and events, both good and bad. Stress can make PTSD worse. Take the following steps to manage stress: Talk with your health care provider or a counselor if you would like to learn more about techniques to reduce your stress. He or she may suggest some stress reduction techniques such as: Muscle relaxation exercises. Regular exercise. Meditation, yoga, or other mind-body exercises. Breathing exercises. Listening to quiet music. Spending time outside. Maintain a healthy lifestyle. Eat a healthy diet, exercise regularly, get plenty of sleep, and take time to relax. Spend time with others. Talk with them about how you are feeling and what kind of support you need. Try not to isolate yourself, even though you may feel like doing that. Isolating yourself can delay your recovery. Do activities and hobbies that you enjoy. Pace yourself when doing stressful things. Take breaks, and reward yourself when you finish. Make sure that you do not overload your schedule.  Medicines Your health care provider may suggest certain medicines if he or she feels that they will help to improve your condition. Medicines for depression (antidepressants) or severe loss of contact with reality (antipsychotics) may be used to treat PTSD. Avoid using alcohol and other substances that may prevent your medicines from working properly. It is also important to: Talk with your pharmacist or health care provider about all medicines that you take, their possible side effects, and which medicines are safe to take together. Make it your goal to take part in all treatment decisions (shared decision-making). Ask about possible side effects of medicines  that your health care provider recommends, and tell him or her how you feel about having those side effects. It is best if shared decision-making with your health care provider is part of your total treatment plan. If your health care provider prescribes a medicine, you may not notice the full benefits of it for 4-8 weeks. Most people who are treated for PTSD need to take medicine for at least 6-12 months before they feel better. If you are taking medicines as part of your treatment, do not stop taking medicines before you ask your health care provider if it is safe to stop. You may need to have the medicine slowly decreased (tapered) over time to lower the risk of harmful side effects. Relationships Many people who have PTSD have difficulty trusting others. Make an effort to: Take risks and develop trust with close friends and family members. Developing trust in others can help you feel safe and connect you with emotional support. Be open and honest about your feelings. Have fun and relax in safe spaces, such as with friends and family. Think about going to couples counseling, family education classes, or family therapy. Your loved ones may not always know how to be supportive. Therapy can be helpful for everyone. How to recognize changes in your condition Be aware of your symptoms and how often you have them. The following symptoms mean that you need to seek help for your PTSD: You feel suspicious and angry. You  have repeated flashbacks. You avoid going out or being with others. You have an increasing number of fights with close friends or family members, such as your spouse. You have thoughts about hurting yourself or others. You cannot get relief from feelings of depression or anxiety. Follow these instructions at home: Lifestyle Exercise regularly. Try to do 30 or more minutes of physical activity on most days of the week. Try to get 7-9 hours of sleep each night. To help with sleep: Keep  your bedroom cool and dark. Avoid screen time before bedtime. This means avoiding use of your TV, computer, tablet, and cell phone. Practice self-soothing skills and use them daily. Try to have fun and seek humor in your life. Eating and drinking Do not eat a heavy meal during the hour before you go to bed. Do not drink alcohol or caffeinated drinks before bed. Avoid using alcohol or drugs. General instructions If your PTSD is affecting your marriage or family, seek help from a family therapist. Remind yourself that recovering from the trauma is a process and takes time. Take over-the-counter and prescription medicines only as told by your health care provider. Make sure to let all of your health care providers know that you have PTSD. This is especially important if you are having surgery or need to be admitted to the hospital. Keep all follow-up visits as told by your health care providers. This is important. Where to find support Talking to others Explain that PTSD is a mental health problem. It is something that a person can develop after experiencing or seeing a life-threatening event. Tell them that PTSD makes you feel stress like you did during the event. Talk to your loved ones about the symptoms you have. Also tell them what things or situations can cause symptoms to start (are triggers for you). Assure your loved ones that there are treatments to help PTSD. Discuss possibly seeking family therapy or couples therapy. If you are worried or fearful about seeking treatment, ask for support. Keep daily contact with at least one trusted friend or family member. Finances Not all insurance plans cover mental health care, so it is important to check with your insurance carrier. If paying for co-pays or counseling services is a problem, search for a local or county mental health care center. Public mental health care services may be offered there at a low cost or no cost when you are not able  to see a private health care provider. If you are a veteran, contact a local veterans organization or veterans hospital for more information. If you are taking medicine for PTSD, you may be able to get the genericform, which may be less expensive than brand-name medicine. Some makers of prescription medicines also offer help to patients who cannot afford the medicines that they need. Therapy and support groups Find a support group in your community. Often, groups are available for [employer] and Compan[employer] veterans, trauma victims, and family members or caregivers. Look into volunteer opportunities. Taking part in these can help you feel more connected to your community. Contact a local organization to find out if you are eligible for a service dog. Where to find more information Go to this website to find more information about PTSD, treatment of PTSD, and how to get support: [facility]: www.ptsd.[place].tnva.gov Contact a health care provider if: Your symptoms get worse or do not get better. Get help right away if: You have thoughts about hurting yourself or others. If you ever feel like  you may hurt yourself or others, or have thoughts about taking your own life, get help right away. You can go to your nearest emergency department or call: Your local emergency services (911 in the [place] A suicide crisis helpline, such as the National Suicide Prevention Lifeline at 54885684451-408 838 0202 or 988 in the [place] This is open 24-hours a day. Summary If you are living with PTSD, there are ways to help you recover from it and manage your symptoms. Find supportive environments and people who understand PTSD. Spend time in those places, and maintain contact with those people. Work with your health care team to create a plan for managing PTSD. The plan should include counseling, stress reduction techniques, and healthy lifestyle habits. This information is not intended to replace advice given to you by your health care  provider. Make sure you discuss any questions you have with your health care provider. Document Revised: 08/09/2021 Document Reviewed: 09/01/2020 Elsevier Patient Education  2022 [employer]

## 2022-01-18 NOTE — Telephone Encounter (Signed)
Patient called and cancelled echocardiogram and does not wish to reschedule. She states she is tired of going to Dr appt and just doesn't feel she needs. Order will be removed from the ECHO WQ.

## 2022-01-18 NOTE — Patient Instructions (Signed)
Medication Instructions:  ?Your physician recommends that you continue on your current medications as directed. Please refer to the Current Medication list given to you today. ? ?*If you need a refill on your cardiac medications before your next appointment, please call your pharmacy* ? ? ?Lab Work: ?None ?If you have labs (blood work) drawn today and your tests are completely normal, you will receive your results only by: ?MyChart Message (if you have MyChart) OR ?A paper copy in the mail ?If you have any lab test that is abnormal or we need to change your treatment, we will call you to review the results. ? ? ?Testing/Procedures: ?Your physician has requested that you have an echocardiogram. Echocardiography is a painless test that uses sound waves to create images of your heart. It provides your doctor with information about the size and shape of your heart and how well your heart?s chambers and valves are working. This procedure takes approximately one hour. There are no restrictions for this procedure. ? ? ?Follow-Up: ?At [facility], you and your health needs are our priority.  As part of our continuing mission to provide you with exceptional heart care, we have created designated Provider Care Teams.  These Care Teams include your primary Cardiologist (physician) and Advanced Practice Providers (APPs -  Physician Assistants and Nurse Practitioners) who all work together to provide you with the care you need, when you need it. ? ?We recommend signing up for the patient portal called "MyChart".  Sign up information is provided on this After Visit Summary.  MyChart is used to connect with patients for Virtual Visits (Telemedicine).  Patients are able to view lab/test results, encounter notes, upcoming appointments, etc.  Non-urgent messages can be sent to your provider as well.   ?To learn more about what you can do with MyChart, go to [email or web address].   ? ?Your next appointment:   ?As needed ? ?The  format for your next appointment:   ?In Person ? ?Provider:   ?Henry W.B. Smith, III, MD  ? ? ?Other Instructions ?  ?

## 2022-01-20 ENCOUNTER — Encounter: Payer: Self-pay | Admitting: Neurology

## 2022-01-20 DIAGNOSIS — F411 Generalized anxiety disorder: Secondary | ICD-10-CM

## 2022-01-20 DIAGNOSIS — S060XAA Concussion with loss of consciousness status unknown, initial encounter: Secondary | ICD-10-CM | POA: Insufficient documentation

## 2022-01-20 DIAGNOSIS — G44309 Post-traumatic headache, unspecified, not intractable: Secondary | ICD-10-CM | POA: Insufficient documentation

## 2022-01-20 DIAGNOSIS — F419 Anxiety disorder, unspecified: Secondary | ICD-10-CM | POA: Insufficient documentation

## 2022-01-20 HISTORY — DX: Generalized anxiety disorder: F41.1

## 2022-01-21 ENCOUNTER — Telehealth: Payer: Self-pay | Admitting: *Deleted

## 2022-01-21 NOTE — Telephone Encounter (Signed)
Per Dr Lucia Gaskins, referral sent to [facility] for driver [facility]. Received a receipt of confirmation.

## 2022-01-22 DIAGNOSIS — M25642 Stiffness of left hand, not elsewhere classified: Secondary | ICD-10-CM | POA: Diagnosis not present
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
## 2022-01-29 ENCOUNTER — Other Ambulatory Visit (HOSPITAL_COMMUNITY): Payer: Medicare HMO

## 2022-01-29 DIAGNOSIS — S62317A Displaced fracture of base of fifth metacarpal bone. left hand, initial encounter for closed fracture: Secondary | ICD-10-CM | POA: Diagnosis not present

## 2022-01-29 DIAGNOSIS — M25642 Stiffness of left hand, not elsewhere classified: Secondary | ICD-10-CM | POA: Diagnosis not present

## 2022-02-07 ENCOUNTER — Other Ambulatory Visit: Payer: Self-pay

## 2022-02-07 ENCOUNTER — Ambulatory Visit: Payer: Medicare HMO | Admitting: Physician Assistant

## 2022-02-07 ENCOUNTER — Encounter: Payer: Self-pay | Admitting: Physician Assistant

## 2022-02-07 VITALS — BP 136/80 | HR 88 | Ht 67.0 in | Wt 159.2 lb

## 2022-02-07 DIAGNOSIS — E790 Hyperuricemia without signs of inflammatory arthritis and tophaceous disease: Secondary | ICD-10-CM

## 2022-02-07 DIAGNOSIS — M7061 Trochanteric bursitis, right hip: Secondary | ICD-10-CM | POA: Diagnosis not present

## 2022-02-07 DIAGNOSIS — L821 Other seborrheic keratosis: Secondary | ICD-10-CM | POA: Diagnosis not present

## 2022-02-07 DIAGNOSIS — M1A0720 Idiopathic chronic gout, left ankle and foot, without tophus (tophi): Secondary | ICD-10-CM | POA: Diagnosis not present

## 2022-02-07 DIAGNOSIS — I7300 Raynaud's syndrome without gangrene: Secondary | ICD-10-CM

## 2022-02-07 DIAGNOSIS — M810 Age-related osteoporosis without current pathological fracture: Secondary | ICD-10-CM

## 2022-02-07 DIAGNOSIS — M349 Systemic sclerosis, unspecified: Secondary | ICD-10-CM

## 2022-02-07 DIAGNOSIS — Z8679 Personal history of other diseases of the circulatory system: Secondary | ICD-10-CM | POA: Diagnosis not present

## 2022-02-07 DIAGNOSIS — M4004 Postural kyphosis, thoracic region: Secondary | ICD-10-CM | POA: Diagnosis not present

## 2022-02-07 DIAGNOSIS — Z8639 Personal history of other endocrine, nutritional and metabolic disease: Secondary | ICD-10-CM

## 2022-02-07 DIAGNOSIS — M7062 Trochanteric bursitis, left hip: Secondary | ICD-10-CM

## 2022-02-07 MED ORDER — TRIAMCINOLONE ACETONIDE 40 MG/ML IJ SUSP
40.0000 mg | INTRAMUSCULAR | Status: AC | PRN
  Administered 2022-02-07: 40 mg via INTRA_ARTICULAR

## 2022-02-07 MED ORDER — LIDOCAINE HCL 1 % IJ SOLN
1.5000 mL | INTRAMUSCULAR | Status: AC | PRN
  Administered 2022-02-07: 1.5 mL

## 2022-02-07 MED ORDER — COLCHICINE 0.6 MG PO TABS: 0.6000 mg | ORAL_TABLET | ORAL | 0 refills | Status: DC | PRN

## 2022-02-07 NOTE — Patient Instructions (Addendum)
Continue allopurinol 300 mg by mouth daily  Take colchicine as needed for flares     Hip Bursitis Rehab Ask your health care provider which exercises are safe for you. Do exercises exactly as told by your health care provider and adjust them as directed. It is normal to feel mild stretching, pulling, tightness, or discomfort as you do these exercises. Stop right away if you feel sudden pain or your pain gets worse. Do not begin these exercises until told by your health care provider. Stretching exercise This exercise warms up your muscles and joints and improves the movement and flexibility of your hip. This exercise also helps to relieve pain and stiffness. Iliotibial band stretch An iliotibial band is a strong band of muscle tissue that runs from the outer side of your hip to the outer side of your thigh and knee. Lie on your side with your left / right leg in the top position. Bend your left / right knee and grab your ankle. Stretch out your bottom arm to help you balance. Slowly bring your knee back so your thigh is behind your body. Slowly lower your knee toward the floor until you feel a gentle stretch on the outside of your left / right thigh. If you do not feel a stretch and your knee will not fall farther, place the heel of your other foot on top of your knee and pull your knee down toward the floor with your foot. Hold this position for __________ seconds. Slowly return to the starting position. Repeat __________ times. Complete this exercise __________ times a day. Strengthening exercises These exercises build strength and endurance in your hip and pelvis. Endurance is the ability to use your muscles for a long time, even after they get tired. Bridge This exercise strengthens the muscles that move your thigh backward (hip extensors). Lie on your back on a firm surface with your knees bent and your feet flat on the floor. Tighten your buttocks muscles and lift your buttocks off the  floor until your trunk is level with your thighs. Do not arch your back. You should feel the muscles working in your buttocks and the back of your thighs. If you do not feel these muscles, slide your feet 1-2 inches (2.5-5 cm) farther away from your buttocks. If this exercise is too easy, try doing it with your arms crossed over your chest. Hold this position for __________ seconds. Slowly lower your hips to the starting position. Let your muscles relax completely after each repetition. Repeat __________ times. Complete this exercise __________ times a day. Squats This exercise strengthens the muscles in front of your thigh and knee (quadriceps). Stand in front of a table, with your feet and knees pointing straight ahead. You may rest your hands on the table for balance but not for support. Slowly bend your knees and lower your hips like you are going to sit in a chair. Keep your weight over your heels, not over your toes. Keep your lower legs upright so they are parallel with the table legs. Do not let your hips go lower than your knees. Do not bend lower than told by your health care provider. If your hip pain increases, do not bend as low. Hold the squat position for __________ seconds. Slowly push with your legs to return to standing. Do not use your hands to pull yourself to standing. Repeat __________ times. Complete this exercise __________ times a day. Hip hike Stand sideways on a bottom step. Stand  on your left / right leg with your other foot unsupported next to the step. You can hold on to the railing or wall for balance if needed. Keep your knees straight and your torso square. Then lift your left / right hip up toward the ceiling. Hold this position for __________ seconds. Slowly let your left / right hip lower toward the floor, past the starting position. Your foot should get closer to the floor. Do not lean or bend your knees. Repeat __________ times. Complete this exercise  __________ times a day. Single leg stand Without shoes, stand near a railing or in a doorway. You may hold on to the railing or door frame as needed for balance. Squeeze your left / right buttock muscles, then lift up your other foot. Do not let your left / right hip push out to the side. It is helpful to stand in front of a mirror for this exercise so you can watch your hip. Hold this position for __________ seconds. Repeat __________ times. Complete this exercise __________ times a day. This information is not intended to replace advice given to you by your health care provider. Make sure you discuss any questions you have with your health care provider. Document Revised: 04/12/2019 Document Reviewed: 04/12/2019 Elsevier Patient Education  [place].

## 2022-02-07 NOTE — Progress Notes (Unsigned)
Please review no print colchicine prescription (with sig change) and sign. Thanks!

## 2022-02-08 ENCOUNTER — Telehealth: Payer: Self-pay

## 2022-02-08 LAB — CBC WITH DIFFERENTIAL/PLATELET
Absolute Monocytes: 507 cells/uL (ref 200–950)
Basophils Absolute: 11 cells/uL (ref 0–200)
Basophils Relative: 0.2 %
Eosinophils Absolute: 80 cells/uL (ref 15–500)
Eosinophils Relative: 1.4 %
HCT: 36.6 % (ref 35.0–45.0)
Hemoglobin: 11.7 g/dL (ref 11.7–15.5)
Lymphs Abs: 1687 cells/uL (ref 850–3900)
MCH: 27.7 pg (ref 27.0–33.0)
MCHC: 32 g/dL (ref 32.0–36.0)
MCV: 86.7 fL (ref 80.0–100.0)
MPV: 12.7 fL — ABNORMAL HIGH (ref 7.5–12.5)
Monocytes Relative: 8.9 %
Neutro Abs: 3414 cells/uL (ref 1500–7800)
Neutrophils Relative %: 59.9 %
Platelets: 220 10*3/uL (ref 140–400)
RBC: 4.22 10*6/uL (ref 3.80–5.10)
RDW: 14.6 % (ref 11.0–15.0)
Total Lymphocyte: 29.6 %
WBC: 5.7 10*3/uL (ref 3.8–10.8)

## 2022-02-08 LAB — COMPLETE METABOLIC PANEL WITH GFR
AG Ratio: 1.3 (calc) (ref 1.0–2.5)
ALT: 21 U/L (ref 6–29)
AST: 16 U/L (ref 10–35)
Albumin: 4 g/dL (ref 3.6–5.1)
Alkaline phosphatase (APISO): 73 U/L (ref 37–153)
BUN: 16 mg/dL (ref 7–25)
CO2: 24 mmol/L (ref 20–32)
Calcium: 9.3 mg/dL (ref 8.6–10.4)
Chloride: 107 mmol/L (ref 98–110)
Creat: 1 mg/dL (ref 0.60–1.00)
Globulin: 3 g/dL (calc) (ref 1.9–3.7)
Glucose, Bld: 191 mg/dL — ABNORMAL HIGH (ref 65–99)
Potassium: 4.6 mmol/L (ref 3.5–5.3)
Sodium: 139 mmol/L (ref 135–146)
Total Bilirubin: 0.4 mg/dL (ref 0.2–1.2)
Total Protein: 7 g/dL (ref 6.1–8.1)
eGFR: 58 mL/min/{1.73_m2} — ABNORMAL LOW (ref >=60)

## 2022-02-08 LAB — URINALYSIS, ROUTINE W REFLEX MICROSCOPIC
Bilirubin Urine: NEGATIVE
Glucose, UA: NEGATIVE
Hgb urine dipstick: NEGATIVE
Ketones, ur: NEGATIVE
Leukocytes,Ua: NEGATIVE
Nitrite: NEGATIVE
Protein, ur: NEGATIVE
Specific Gravity, Urine: 1.014 (ref 1.001–1.035)
pH: 5.5 (ref 5.0–8.0)

## 2022-02-08 LAB — URIC ACID: Uric Acid, Serum: 4.2 mg/dL (ref 2.5–7.0)

## 2022-02-08 NOTE — Progress Notes (Signed)
Uric acid is in desirable range.  UA is negative.  Glucose is elevated at 191.  CBC is normal.  Please forward results to her PCP.

## 2022-02-08 NOTE — Telephone Encounter (Signed)
Patient's husband Onalee Hua called asking that I leave another message for Dr. Corliss Skains requesting her to return the call on his cell #[phone]

## 2022-02-08 NOTE — Telephone Encounter (Signed)
Patient's husband Onalee Hua left a voicemail stating "I need Dr. Corliss Skains to call me."   #[phone]

## 2022-02-08 NOTE — Telephone Encounter (Signed)
I returned call and spoke with Onalee Hua at length.  He stated that patient has been off allopurinol for almost 1 year.  She has been taking colchicine 1 tablet daily.  I explained to him as her uric acid has been in desirable range she can take colchicine on as needed basis.  She does not need to resume allopurinol if she has not been taking it for the last 1 year.  If she has a gout flare that she should notify us.  He also wanted to know that why the procedure note is not in the AVS.  I explained to him that the procedure note does not populate in the AVS.  He was not happy with yesterday's visit.  He stated that he was surprised to see the PA instead of the doctor.  I advised him to schedule appointment with me for the follow-up visit.

## 2022-02-14 NOTE — Telephone Encounter (Signed)
I called patient's spouse on 02/08/2022. I assured him that his wife, Andrea Heath, would be scheduled with Dr. Corliss Skains for every appointment in the future. I explained to Mr. Pesantez that, in the past, Dr. Corliss Skains reviewed every appt and spoke to every patient. Our policy has changed and now Dr. Corliss Skains only visits the patient's on her schedule unless there is an issue that [name], PA needs Dr. Fatima Sanger assistance. I explained to Mr. Gravelle that [name] is a very knowledgeable PA who was trained by Dr. Corliss Skains.

## 2022-02-26 ENCOUNTER — Telehealth: Payer: Self-pay | Admitting: Neurology

## 2022-02-26 NOTE — Telephone Encounter (Signed)
I called husband and LVM to return call when he can.
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
## 2022-02-26 NOTE — Telephone Encounter (Signed)
Husband called back. I tried to get information to assist in the the phone call for Dr. Lucia Gaskins, and he just did not get that it goes thru nurses first, and thought Dr. Lucia Gaskins would call back.  I attempted several time, finally said that would let Dr. Lucia Gaskins know.

## 2022-02-27 NOTE — Telephone Encounter (Signed)
I called and LMVM for husband that returned call from Dr. Jaynee Eagles whom said that we could set up a telephone call visit or mychart VV to discuss concerns.  If he calls back 03-13-2022  at 9:30 am can be offered with Dr. Jaynee Eagles as telephone or VV.  Or if NP has sooner appt, Dr. Jaynee Eagles said that was ok too.  ?

## 2022-02-27 NOTE — Telephone Encounter (Signed)
FYI pt's husband called back and he accepted the 03-15 for 9:30 as a tele visit and he confirmed [phone] to be the best # to call.  No call back requested ?

## 2022-02-27 NOTE — Telephone Encounter (Signed)
Thank you :)

## 2022-03-13 ENCOUNTER — Other Ambulatory Visit: Payer: Self-pay

## 2022-03-13 ENCOUNTER — Ambulatory Visit (INDEPENDENT_AMBULATORY_CARE_PROVIDER_SITE_OTHER): Payer: Medicare HMO | Admitting: Neurology

## 2022-03-13 DIAGNOSIS — R69 Illness, unspecified: Secondary | ICD-10-CM | POA: Diagnosis not present

## 2022-03-13 DIAGNOSIS — G44309 Post-traumatic headache, unspecified, not intractable: Secondary | ICD-10-CM | POA: Diagnosis not present

## 2022-03-13 DIAGNOSIS — F419 Anxiety disorder, unspecified: Secondary | ICD-10-CM | POA: Diagnosis not present
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
## 2022-03-14 ENCOUNTER — Telehealth: Payer: Self-pay | Admitting: Neurology

## 2022-03-14 NOTE — Telephone Encounter (Signed)
Lisa in phone room, relayed to husband ,who was still at [facility] that Dr. Jaynee Eagles had tried to reach out to pcp and was not able to reach and would get to them by weekend.   He responded back that not what was relayed to him yesterday and wanted a call back from Dr. Jaynee Eagles.  It was relayed to him that was a message from Dr. Jaynee Eagles.  ?

## 2022-03-14 NOTE — Telephone Encounter (Signed)
Pt's husband has called while in [employer] to report the medication for anxiety for pt that Dr Jaynee Eagles said she would call in is not at pharmacy.  Please call pt's husband with the status of the medication being called in ?

## 2022-03-15 NOTE — Telephone Encounter (Signed)
Pt's husband has called back inquiring about the status of pt's medication being called in.  Pt's husband was informed that the office is not open today and that there was no further response beyond the entry on yesterday afternoon. Pt's husband is asking for a call on Monday re: where things stand on the refilling of pt's medication. ?

## 2022-03-17 ENCOUNTER — Encounter: Payer: Self-pay | Admitting: Neurology

## 2022-03-17 MED ORDER — CITALOPRAM HYDROBROMIDE 10 MG PO TABS: 10.0000 mg | ORAL_TABLET | Freq: Every day | ORAL | 6 refills | Status: AC

## 2022-03-18 ENCOUNTER — Encounter: Payer: Self-pay | Admitting: Psychology

## 2022-03-18 DIAGNOSIS — I7300 Raynaud's syndrome without gangrene: Secondary | ICD-10-CM | POA: Insufficient documentation

## 2022-03-18 DIAGNOSIS — E7800 Pure hypercholesterolemia, unspecified: Secondary | ICD-10-CM | POA: Insufficient documentation

## 2022-03-18 DIAGNOSIS — Z860100 Personal history of colon polyps, unspecified: Secondary | ICD-10-CM | POA: Insufficient documentation

## 2022-03-18 DIAGNOSIS — M2010 Hallux valgus (acquired), unspecified foot: Secondary | ICD-10-CM | POA: Insufficient documentation

## 2022-03-18 DIAGNOSIS — E785 Hyperlipidemia, unspecified: Secondary | ICD-10-CM | POA: Insufficient documentation

## 2022-03-18 DIAGNOSIS — I609 Nontraumatic subarachnoid hemorrhage, unspecified: Secondary | ICD-10-CM | POA: Insufficient documentation

## 2022-03-18 NOTE — Telephone Encounter (Signed)
Called husband back (checkd DPR ) LVM for husband to call me back about patients medication (Celexa)  ?

## 2022-03-18 NOTE — Telephone Encounter (Signed)
Relayed message to pt's husband Celaxa 10 mg. Pt's husband verbalize understand. ?

## 2022-03-19 ENCOUNTER — Encounter: Payer: Self-pay | Admitting: Psychology

## 2022-03-19 ENCOUNTER — Ambulatory Visit (INDEPENDENT_AMBULATORY_CARE_PROVIDER_SITE_OTHER): Payer: Medicare HMO | Admitting: Psychology

## 2022-03-19 ENCOUNTER — Other Ambulatory Visit: Payer: Self-pay

## 2022-03-19 ENCOUNTER — Ambulatory Visit: Payer: Medicare HMO

## 2022-03-19 DIAGNOSIS — F0280 Dementia in other diseases classified elsewhere without behavioral disturbance: Secondary | ICD-10-CM | POA: Diagnosis not present

## 2022-03-19 DIAGNOSIS — R4189 Other symptoms and signs involving cognitive functions and awareness: Secondary | ICD-10-CM

## 2022-03-19 DIAGNOSIS — S069X0S Unspecified intracranial injury without loss of consciousness, sequela: Secondary | ICD-10-CM | POA: Diagnosis not present

## 2022-03-19 DIAGNOSIS — G309 Alzheimer's disease, unspecified: Secondary | ICD-10-CM

## 2022-03-19 DIAGNOSIS — I609 Nontraumatic subarachnoid hemorrhage, unspecified: Secondary | ICD-10-CM

## 2022-03-19 HISTORY — DX: Dementia in other diseases classified elsewhere, unspecified severity, without behavioral disturbance, psychotic disturbance, mood disturbance, and anxiety: F02.80
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
## 2022-03-19 NOTE — Progress Notes (Signed)
? ?  Psychometrician Note ?  ?Cognitive testing was administered to Andrea Heath by Shan Levans, B.S. (psychometrist) under the supervision of Dr. Newman Nickels, Ph.D., licensed psychologist on 03/19/2022. Andrea Heath did not appear overtly distressed by the testing session per behavioral observation or responses across self-report questionnaires. Rest breaks were offered.  ?  ?The battery of tests administered was selected by Dr. Newman Nickels, Ph.D. with consideration to Andrea Heath current level of functioning, the nature of her symptoms, emotional and behavioral responses during interview, level of literacy, observed level of motivation/effort, and the nature of the referral question. This battery was communicated to the psychometrist. Communication between Dr. Newman Nickels, Ph.D. and the psychometrist was ongoing throughout the evaluation and Dr. Newman Nickels, Ph.D. was immediately accessible at all times. Dr. Newman Nickels, Ph.D. provided supervision to the psychometrist on the date of this service to the extent necessary to assure the quality of all services provided.  ?  ?Andrea Heath will return within approximately 1-2 weeks for an interactive feedback session with Dr. Milbert Coulter at which time her test performances, clinical impressions, and treatment recommendations will be reviewed in detail. Andrea Heath understands she can contact our office should she require our assistance before this time. ? ?A total of 135 minutes of billable time were spent face-to-face with Andrea Heath by the psychometrist. This includes both test administration and scoring time. Billing for these services is reflected in the clinical report generated by Dr. Newman Nickels, Ph.D. ? ?This note reflects time spent with the psychometrician and does not include test scores or any clinical interpretations made by Dr. Milbert Coulter. The full report will follow in a separate note.  ?

## 2022-03-20 ENCOUNTER — Encounter: Payer: Self-pay | Admitting: Psychology

## 2022-03-21 ENCOUNTER — Telehealth: Payer: Self-pay | Admitting: Neurology

## 2022-03-21 DIAGNOSIS — E7800 Pure hypercholesterolemia, unspecified: Secondary | ICD-10-CM | POA: Diagnosis not present

## 2022-03-21 DIAGNOSIS — I10 Essential (primary) hypertension: Secondary | ICD-10-CM | POA: Diagnosis not present

## 2022-03-21 DIAGNOSIS — E1169 Type 2 diabetes mellitus with other specified complication: Secondary | ICD-10-CM | POA: Diagnosis not present

## 2022-03-21 DIAGNOSIS — E039 Hypothyroidism, unspecified: Secondary | ICD-10-CM | POA: Diagnosis not present

## 2022-03-21 NOTE — Telephone Encounter (Signed)
Pt's husband, Andrea Heath, need referral for driving re-certification to [facility]. Would like a call from the nurse. ?

## 2022-03-21 NOTE — Telephone Encounter (Signed)
Pt husband has called asking the message be sent to RN re: driving re certification for pt. Husband was told the message would be sent.  ?

## 2022-03-21 NOTE — Telephone Encounter (Signed)
I called and LMVM for husband go message about [facility] and wanting referral to [facility].  I relayed will complete referral form and fax.   ?

## 2022-03-25 ENCOUNTER — Encounter: Payer: Self-pay | Admitting: Neurology

## 2022-03-25 NOTE — Telephone Encounter (Signed)
I faxed to [employer] for [facility].  Received fax confirmation. [phone], of [phone]. ?

## 2022-03-26 ENCOUNTER — Ambulatory Visit (INDEPENDENT_AMBULATORY_CARE_PROVIDER_SITE_OTHER): Payer: Medicare HMO | Admitting: Psychology

## 2022-03-26 ENCOUNTER — Other Ambulatory Visit: Payer: Self-pay

## 2022-03-26 DIAGNOSIS — I609 Nontraumatic subarachnoid hemorrhage, unspecified: Secondary | ICD-10-CM | POA: Diagnosis not present

## 2022-03-26 DIAGNOSIS — S060X9S Concussion with loss of consciousness of unspecified duration, sequela: Secondary | ICD-10-CM

## 2022-03-26 DIAGNOSIS — G309 Alzheimer's disease, unspecified: Secondary | ICD-10-CM

## 2022-03-26 DIAGNOSIS — F0280 Dementia in other diseases classified elsewhere without behavioral disturbance: Secondary | ICD-10-CM | POA: Diagnosis not present

## 2022-03-26 NOTE — Telephone Encounter (Signed)
LMVM for husband that referral to [facility] was done to [place].  Fax confirmation received.  ?
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
## 2022-03-27 ENCOUNTER — Telehealth: Payer: Self-pay | Admitting: Psychology

## 2022-03-27 NOTE — Telephone Encounter (Signed)
Left a voice mail to contact [facility] where her neurologist Dr Lucia Gaskins is at so that they can see if  she would like to order the medication Dr Milbert Coulter talked about.  ?

## 2022-03-27 NOTE — Telephone Encounter (Signed)
Patients husband called back

## 2022-03-27 NOTE — Telephone Encounter (Signed)
Patients husband called, he would like the medication Melvyn Novas was talking about sent in to their pharmacy. He could not remember what it was. He said if you have any issues, please call and let him know  ?[facility] pharmacy on file. ? ?

## 2022-03-27 NOTE — Telephone Encounter (Signed)
See other phone note

## 2022-03-27 NOTE — Telephone Encounter (Signed)
04/09/22 at 1:30pm with Sharene Butters, NP, okay per Melvyn Novas. ?

## 2022-03-27 NOTE — Telephone Encounter (Signed)
Yes, I know, I told him that. I will call him again and let him know. Thank you ?

## 2022-03-27 NOTE — Telephone Encounter (Signed)
Patients husband called back. He stated [facility] has dismissed her from their office, it stated it was due to the office-patient relationship. He would like some more info.  ?

## 2022-04-02 DIAGNOSIS — R519 Headache, unspecified: Secondary | ICD-10-CM | POA: Diagnosis not present

## 2022-04-02 DIAGNOSIS — H524 Presbyopia: Secondary | ICD-10-CM | POA: Diagnosis not present

## 2022-04-02 DIAGNOSIS — H35033 Hypertensive retinopathy, bilateral: Secondary | ICD-10-CM | POA: Diagnosis not present

## 2022-04-02 DIAGNOSIS — H35371 Puckering of macula, right eye: Secondary | ICD-10-CM | POA: Diagnosis not present

## 2022-04-02 DIAGNOSIS — H2513 Age-related nuclear cataract, bilateral: Secondary | ICD-10-CM | POA: Diagnosis not present

## 2022-04-02 DIAGNOSIS — E119 Type 2 diabetes mellitus without complications: Secondary | ICD-10-CM | POA: Diagnosis not present

## 2022-04-09 ENCOUNTER — Encounter: Payer: Self-pay | Admitting: Physician Assistant

## 2022-04-09 ENCOUNTER — Other Ambulatory Visit (INDEPENDENT_AMBULATORY_CARE_PROVIDER_SITE_OTHER): Payer: Medicare HMO

## 2022-04-09 ENCOUNTER — Ambulatory Visit: Payer: Medicare HMO | Admitting: Physician Assistant

## 2022-04-09 DIAGNOSIS — G309 Alzheimer's disease, unspecified: Secondary | ICD-10-CM | POA: Diagnosis not present

## 2022-04-09 DIAGNOSIS — R69 Illness, unspecified: Secondary | ICD-10-CM | POA: Diagnosis not present

## 2022-04-09 DIAGNOSIS — F0280 Dementia in other diseases classified elsewhere without behavioral disturbance: Secondary | ICD-10-CM

## 2022-04-09 DIAGNOSIS — G44209 Tension-type headache, unspecified, not intractable: Secondary | ICD-10-CM

## 2022-04-09 DIAGNOSIS — D509 Iron deficiency anemia, unspecified: Secondary | ICD-10-CM | POA: Diagnosis not present

## 2022-04-09 LAB — TSH: TSH: 1.27 u[IU]/mL (ref 0.35–5.50)

## 2022-04-09 LAB — FOLATE: Folate: 22.1 ng/mL (ref >5.9)

## 2022-04-09 LAB — FERRITIN: Ferritin: 9.3 ng/mL — ABNORMAL LOW (ref 10.0–291.0)

## 2022-04-09 LAB — VITAMIN B12: Vitamin B-12: 553 pg/mL (ref 211–911)

## 2022-04-09 MED ORDER — KETOROLAC TROMETHAMINE 60 MG/2ML IM SOLN
60.0000 mg | Freq: Once | INTRAMUSCULAR | Status: AC
  Administered 2022-04-09: 60 mg via INTRAMUSCULAR

## 2022-04-09 MED ORDER — RIVASTIGMINE TARTRATE 1.5 MG PO CAPS: ORAL_CAPSULE | ORAL | 0 refills | Status: DC

## 2022-04-09 NOTE — Patient Instructions (Addendum)
It was a pleasure to see you today at our office.  ? ?Recommendations: ? ?Meds: ?Follow up in 3 months  ?Start rivastigmine 1.5 mg, take 1 cap x 10 days, then increase to 1 cap two times a day  ?Checks labs  ?MRI of the brain  ? ? ? ?RECOMMENDATIONS FOR ALL PATIENTS WITH MEMORY PROBLEMS: ?1. Continue to exercise (Recommend 30 minutes of walking everyday, or 3 hours every week) ?2. Increase social interactions - continue going to [address] and enjoy social gatherings with friends and family ?3. Eat healthy, avoid fried foods and eat more fruits and vegetables ?4. Maintain adequate blood pressure, blood sugar, and blood cholesterol level. Reducing the risk of stroke and cardiovascular disease also helps promoting better memory. ?5. Avoid stressful situations. Live a simple life and avoid aggravations. Organize your time and prepare for the next day in anticipation. ?6. Sleep well, avoid any interruptions of sleep and avoid any distractions in the bedroom that may interfere with adequate sleep quality ?7. Avoid sugar, avoid sweets as there is a strong link between excessive sugar intake, diabetes, and cognitive impairment ?We discussed the Mediterranean diet, which has been shown to help patients reduce the risk of progressive memory disorders and reduces cardiovascular risk. This includes eating fish, eat fruits and green leafy vegetables, nuts like almonds and hazelnuts, walnuts, and also use olive oil. Avoid fast foods and fried foods as much as possible. Avoid sweets and sugar as sugar use has been linked to worsening of memory function. ? ?There is always a concern of gradual progression of memory problems. If this is the case, then we may need to adjust level of care according to patient needs. Support, both to the patient and caregiver, should then be put into place.  ? ? ?The Alzheimer?s Association is here all day, every day for people facing Alzheimer?s disease through our free 24/7 Helpline: [phone]. The  Helpline provides reliable information and support to all those who need assistance, such as individuals living with memory loss, Alzheimer's or other dementia, caregivers, health care professionals and the public.  ?Our highly trained and knowledgeable staff can help you with: ?Understanding memory loss, dementia and Alzheimer's  ?Medications and other treatment options  ?General information about aging and brain health  ?Skills to provide quality care and to find the best care from professionals  ?Legal, financial and living-arrangement decisions ?Our Helpline also features: ?Confidential care consultation provided by master's level clinicians who can help with decision-making support, crisis assistance and education on issues families face every day  ?Help in a caller's preferred language using our translation service that features more than 200 languages and dialects  ?Referrals to local community programs, services and ongoing support ? ? ? ? ?FALL PRECAUTIONS: Be cautious when walking. Scan the area for obstacles that may increase the risk of trips and falls. When getting up in the mornings, sit up at the edge of the bed for a few minutes before getting out of bed. Consider elevating the bed at the head end to avoid drop of blood pressure when getting up. Walk always in a well-lit room (use night lights in the walls). Avoid area rugs or power cords from appliances in the middle of the walkways. Use a walker or a cane if necessary and consider physical therapy for balance exercise. Get your eyesight checked regularly. ? ?FINANCIAL OVERSIGHT: Supervision, especially oversight when making financial decisions or transactions is also recommended. ? ?HOME SAFETY: Consider the safety of the kitchen  when operating appliances like stoves, microwave oven, and blender. Consider having supervision and share cooking responsibilities until no longer able to participate in those. Accidents with firearms and other hazards in  the house should be identified and addressed as well. ? ? ?ABILITY TO BE LEFT ALONE: If patient is unable to contact 911 operator, consider using LifeLine, or when the need is there, arrange for someone to stay with patients. Smoking is a fire hazard, consider supervision or cessation. Risk of wandering should be assessed by caregiver and if detected at any point, supervision and safe proof recommendations should be instituted. ? ?MEDICATION SUPERVISION: Inability to self-administer medication needs to be constantly addressed. Implement a mechanism to ensure safe administration of the medications. ? ? ?DRIVING: Regarding driving, in patients with progressive memory problems, driving will be impaired. We advise to have someone else do the driving if trouble finding directions or if minor accidents are reported. Independent driving assessment is available to determine safety of driving. ? ? ?If you are interested in the driving assessment, you can contact the following: ? ?[employer] in [place] [phone] ? ?[facility] [phone] ? ?[facility] [phone] ? ?[facility] [phone] or [phone] ? ?  ? ? ?Mediterranean Diet ?A Mediterranean diet refers to food and lifestyle choices that are based on the traditions of countries located on the [place]. This way of eating has been shown to help prevent certain conditions and improve outcomes for people who have chronic diseases, like kidney disease and heart disease. ?What are tips for following this plan? ?Lifestyle  ?Cook and eat meals together with your family, when possible. ?Drink enough fluid to keep your urine clear or pale yellow. ?Be physically active every day. This includes: ?Aerobic exercise like running or swimming. ?Leisure activities like gardening, walking, or housework. ?Get 7-8 hours of sleep each night. ?If recommended by your health care provider, drink red wine in moderation. This  means 1 glass a day for nonpregnant women and 2 glasses a day for men. A glass of wine equals 5 oz (150 mL). ?Reading food labels  ?Check the serving size of packaged foods. For foods such as rice and pasta, the serving size refers to the amount of cooked product, not dry. ?Check the total fat in packaged foods. Avoid foods that have saturated fat or trans fats. ?Check the ingredients list for added sugars, such as corn syrup. ?Shopping  ?At the grocery store, buy most of your food from the areas near the walls of the store. This includes: ?Fresh fruits and vegetables (produce). ?Grains, beans, nuts, and seeds. Some of these may be available in unpackaged forms or large amounts (in bulk). ?Fresh seafood. ?Poultry and eggs. ?Low-fat dairy products. ?Buy whole ingredients instead of prepackaged foods. ?Buy fresh fruits and vegetables in-season from local farmers markets. ?Buy frozen fruits and vegetables in resealable bags. ?If you do not have access to quality fresh seafood, buy precooked frozen shrimp or canned fish, such as tuna, salmon, or sardines. ?Buy small amounts of raw or cooked vegetables, salads, or olives from the deli or salad bar at your store. ?Stock your pantry so you always have certain foods on hand, such as olive oil, canned tuna, canned tomatoes, rice, pasta, and beans. ?Cooking  ?Cook foods with extra-virgin olive oil instead of using butter or other vegetable oils. ?Have meat as a side dish, and have vegetables or grains as your main dish. This means having meat in small portions or  adding small amounts of meat to foods like pasta or stew. ?Use beans or vegetables instead of meat in common dishes like chili or lasagna. ?Experiment with different cooking methods. Try roasting or broiling vegetables instead of steaming or saut?eing them. ?Add frozen vegetables to soups, stews, pasta, or rice. ?Add nuts or seeds for added healthy fat at each meal. You can add these to yogurt, salads, or vegetable  dishes. ?Marinate fish or vegetables using olive oil, lemon juice, garlic, and fresh herbs. ?Meal planning  ?Plan to eat 1 vegetarian meal one day each week. Try to work up to 2 vegetarian meals, if po
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
## 2022-04-10 LAB — IRON AND TIBC
Iron Saturation: 7 % — CL (ref 15–55)
Iron: 24 ug/dL — ABNORMAL LOW (ref 27–139)
Total Iron Binding Capacity: 341 ug/dL (ref 250–450)
UIBC: 317 ug/dL (ref 118–369)

## 2022-04-10 NOTE — Progress Notes (Signed)
Please inform patient Iron and ferritin are very low, will let her primary doctor know, she needs Iron infusion as soon as possible

## 2022-04-11 ENCOUNTER — Telehealth: Payer: Self-pay | Admitting: Physician Assistant

## 2022-04-11 NOTE — Telephone Encounter (Signed)
Patients husband said Ines was supposed to reach out to our office about an MRI. He said christy told him "they" would reach out and sch it. I tried to explain it to him, but he seemed a bit confused.  ?

## 2022-04-11 NOTE — Telephone Encounter (Signed)
Sent to [facility], awaiting time slot  ?

## 2022-04-12 ENCOUNTER — Telehealth: Payer: Self-pay | Admitting: Physician Assistant

## 2022-04-12 DIAGNOSIS — D509 Iron deficiency anemia, unspecified: Secondary | ICD-10-CM | POA: Diagnosis not present

## 2022-04-12 DIAGNOSIS — R519 Headache, unspecified: Secondary | ICD-10-CM | POA: Diagnosis not present

## 2022-04-12 DIAGNOSIS — G8929 Other chronic pain: Secondary | ICD-10-CM | POA: Diagnosis not present

## 2022-04-12 NOTE — Telephone Encounter (Signed)
Patient advised to contact PCP and [facility], for MRI  ?

## 2022-04-12 NOTE — Telephone Encounter (Signed)
Patients husband ron would like a call back from christy regarding a few things. Said he never received a call regarding appt. Fama has been having bad HA and needs advice about that. Been taking extra strength tylenol but not helping ?

## 2022-04-16 ENCOUNTER — Telehealth: Payer: Self-pay | Admitting: Oncology

## 2022-04-16 NOTE — Telephone Encounter (Signed)
Scheduled appt per 4/18 referral. Pt's husband is aware of appt date and time. Pt's husband is aware to arrive 15 mins prior to appt time and to bring and updated insurance card. Pt's husband is aware of appt location.   ?

## 2022-04-21 ENCOUNTER — Ambulatory Visit
Admission: RE | Admit: 2022-04-21 | Discharge: 2022-04-21 | Disposition: A | Discharge status: Home / Self Care | Payer: Medicare HMO | Source: Ambulatory Visit | Attending: Physician Assistant | Admitting: Physician Assistant

## 2022-04-21 DIAGNOSIS — R413 Other amnesia: Secondary | ICD-10-CM | POA: Diagnosis not present

## 2022-04-21 DIAGNOSIS — R69 Illness, unspecified: Secondary | ICD-10-CM | POA: Diagnosis not present

## 2022-04-22 NOTE — Progress Notes (Signed)
Please inform patient that her MRI brain is normal. Thanks

## 2022-04-26 ENCOUNTER — Inpatient Hospital Stay: Payer: Medicare HMO | Attending: Oncology | Admitting: Oncology

## 2022-04-26 ENCOUNTER — Other Ambulatory Visit: Payer: Self-pay

## 2022-04-26 VITALS — BP 181/73 | HR 87 | Temp 98.1°F | Resp 20 | Wt 160.9 lb

## 2022-04-26 DIAGNOSIS — Z801 Family history of malignant neoplasm of trachea, bronchus and lung: Secondary | ICD-10-CM | POA: Insufficient documentation

## 2022-04-26 DIAGNOSIS — Z803 Family history of malignant neoplasm of breast: Secondary | ICD-10-CM | POA: Insufficient documentation

## 2022-04-26 DIAGNOSIS — R519 Headache, unspecified: Secondary | ICD-10-CM

## 2022-04-26 DIAGNOSIS — D509 Iron deficiency anemia, unspecified: Secondary | ICD-10-CM | POA: Insufficient documentation
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
## 2022-05-14 DIAGNOSIS — Z20822 Contact with and (suspected) exposure to covid-19: Secondary | ICD-10-CM | POA: Diagnosis not present

## 2022-05-14 DIAGNOSIS — R519 Headache, unspecified: Secondary | ICD-10-CM | POA: Diagnosis not present

## 2022-05-14 DIAGNOSIS — R059 Cough, unspecified: Secondary | ICD-10-CM | POA: Diagnosis not present

## 2022-05-14 DIAGNOSIS — R5383 Other fatigue: Secondary | ICD-10-CM | POA: Diagnosis not present

## 2022-05-15 ENCOUNTER — Telehealth: Payer: Self-pay | Admitting: Physician Assistant

## 2022-05-15 NOTE — Telephone Encounter (Signed)
Patient saw 04-09-22 with Andrea Heath and has appt with sara on 07-09-22. We got a referral today about her having headaches and was told that Andrea Heath could address these at her next visit on 07-09-22. Spoke with patient husband and told him that it would be addressed on 07-09-22 and he wants to know what to do for her until then  please call  ?

## 2022-05-15 NOTE — Telephone Encounter (Signed)
To call husband back he is driving [phone] , I contacted patient and spoke with Becky Sax, who is on her DPR. He asked that we see her for headaches, Requested Dr.Jaffe. I did tell him that the policy here was to stay with the same Provider. He said,"if he had to he would come sit on the board to try and get this policy, changed. Noted she was vomiting up blood with a cough, I suggested Er for evaluation and treatment, Pt's husband said, "she was fine now. Noted he did say that he was asked not to let an office know that she had a lawyer, we were already advised of that Last visit.." I did let him know that I would be happy to ask for recoomendations at this office and call back either this am or tomorrow morning.  ? ? ?I called Mr.Spranger back an I advised to take her to the ED for evaluation and he said,"He was going to take her". He asked me what location I said, "[place] or [address]if he lived here in [place]. I advised to Marlowe Kays, PA-C. ? ? ?

## 2022-05-15 NOTE — Telephone Encounter (Signed)
Patient husband is asking to speak to a nurse he has questions about mediation Huntley Dec gave her  ?

## 2022-05-20 ENCOUNTER — Other Ambulatory Visit: Payer: Self-pay | Admitting: Neurology

## 2022-05-21 ENCOUNTER — Telehealth: Payer: Self-pay | Admitting: Neurology

## 2022-05-21 MED ORDER — RIVASTIGMINE TARTRATE 1.5 MG PO CAPS
ORAL_CAPSULE | ORAL | 1 refills | Status: DC
Start: 2022-05-21 — End: 2022-06-25

## 2022-05-21 NOTE — Telephone Encounter (Signed)
The following message was left with [employer] on 05/21/22 at 12:56 PM.   Caller states patient is needing a refill on the patient's ravistigmine.

## 2022-05-21 NOTE — Telephone Encounter (Signed)
Advised patient husband of Refill until her next appt.  He wanted to know why it will be given until if she has an appt in June. Advised the husband that appt is for headache  only with Dr.Aqunio. For Memory she sees Clarise Cruz in July.  Patient husband wanted to know if their is assistance for  [employer]. Advise we will have to see or try the company website. Hey may and some may not offer  it to patients with Medicaid or Medicare.  Ron wants to hold off for now.

## 2022-05-21 NOTE — Telephone Encounter (Signed)
Patients husband called back and stated Andrea Heath uses the [facility] on [address]

## 2022-06-03 ENCOUNTER — Encounter: Payer: Self-pay | Admitting: Neurology

## 2022-06-03 ENCOUNTER — Ambulatory Visit: Payer: Medicare HMO | Admitting: Neurology

## 2022-06-03 VITALS — BP 164/82 | HR 88 | Ht 67.0 in | Wt 160.0 lb

## 2022-06-03 DIAGNOSIS — G44309 Post-traumatic headache, unspecified, not intractable: Secondary | ICD-10-CM

## 2022-06-03 MED ORDER — GABAPENTIN 100 MG PO CAPS: 100.0000 mg | ORAL_CAPSULE | Freq: Every day | ORAL | 6 refills | Status: DC
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
## 2022-06-03 NOTE — Patient Instructions (Addendum)
Good to meet you.  Start Gabapentin 100mg : take 1 capsule every night.  2. Keep a calendar of your headaches. Update me in a month, we may increase dose if needed  3. Follow-up with Sharene Butters as scheduled in July, I will see you in October. Call for any changes

## 2022-06-06 ENCOUNTER — Telehealth: Payer: Self-pay | Admitting: Physician Assistant

## 2022-06-06 NOTE — Telephone Encounter (Signed)
Rescheduled July appointment due to provider pal, called patient regarding new upcoming appointment. Left a voicemail. 

## 2022-06-07 ENCOUNTER — Telehealth: Payer: Self-pay | Admitting: Neurology

## 2022-06-07 NOTE — Telephone Encounter (Signed)
Pt Husband called back they do not need a nurse to help with medication, he is going to call their pharmacy and see if they do the pill pack they may be something he wants to do but he said he would look into it,

## 2022-06-07 NOTE — Telephone Encounter (Signed)
Callled Andrea Heath no answer left a voice mail to call back

## 2022-06-07 NOTE — Telephone Encounter (Signed)
Pls see what husband needs, does he need a nurse to come help with medication management? Or ask pharmacy for pillpacks to help better? Thanks

## 2022-06-07 NOTE — Telephone Encounter (Signed)
Patients husband is having a hard time with Andrea Heath's medicine. She is messing her medicine up. Taking wrong times. I mentioned she may need to get her medicine bubble wrapped from pharmacy. The husband is very confused. I mentioned he may need to get his daughter to help.

## 2022-06-25 ENCOUNTER — Other Ambulatory Visit: Payer: Self-pay | Admitting: Physician Assistant

## 2022-07-03 ENCOUNTER — Telehealth: Payer: Self-pay | Admitting: Rheumatology

## 2022-07-03 ENCOUNTER — Other Ambulatory Visit: Payer: Self-pay | Admitting: Rheumatology

## 2022-07-03 NOTE — Telephone Encounter (Signed)
See previous phone message. 

## 2022-07-03 NOTE — Telephone Encounter (Signed)
Patient called to confirm that her Colchicine medication is filled through [facility].

## 2022-07-03 NOTE — Telephone Encounter (Addendum)
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  Labs: 02/07/2022 Uric acid is in desirable range.  UA is negative.  Glucose is elevated at 191.  CBC is normal.     Okay to refill Colchicine?

## 2022-07-03 NOTE — Telephone Encounter (Signed)
Attempted to contact the patient and left message for patient to call the office.  

## 2022-07-03 NOTE — Telephone Encounter (Signed)
Patient called the office requesting a refill of  Colchicine 0.6mg  be sent to [facility].

## 2022-07-03 NOTE — Telephone Encounter (Signed)
Please clarify if the patient is taking colchicine daily or PRN?  Lipitor and colchicine interact so it is recommended to take colchicine as needed during flares only.

## 2022-07-04 MED ORDER — COLCHICINE 0.6 MG PO TABS: 0.6000 mg | ORAL_TABLET | ORAL | 0 refills | Status: DC | PRN

## 2022-07-04 NOTE — Telephone Encounter (Signed)
Patient returned call to the office. Patient states she is taking colchicine as needed. Patient advised Lipitor and colchicine interact so it is recommended to take colchicine as needed during flares only. Patient verbalized understanding.

## 2022-07-04 NOTE — Telephone Encounter (Signed)
Attempted to contact the patient and left message for patient to call the office.  

## 2022-07-09 ENCOUNTER — Ambulatory Visit: Payer: Medicare HMO | Admitting: Physician Assistant

## 2022-07-09 ENCOUNTER — Encounter: Payer: Self-pay | Admitting: Physician Assistant

## 2022-07-09 VITALS — BP 143/82 | HR 87 | Resp 18 | Ht 67.0 in | Wt 165.0 lb

## 2022-07-09 DIAGNOSIS — F0280 Dementia in other diseases classified elsewhere without behavioral disturbance: Secondary | ICD-10-CM

## 2022-07-09 DIAGNOSIS — R69 Illness, unspecified: Secondary | ICD-10-CM | POA: Diagnosis not present

## 2022-07-09 DIAGNOSIS — G309 Alzheimer's disease, unspecified: Secondary | ICD-10-CM | POA: Diagnosis not present
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
## 2022-07-09 NOTE — Patient Instructions (Addendum)
It was a pleasure to see you today at our office.   Recommendations:  Follow up in  6 months Continue rivastigmine 1.5 mg twice a day  Continue gabapentin for your headache , follow up with Dr. Karel Jarvis in Oct 2023    Whom to call:  Memory  decline, memory medications: Call our office [phone]   For psychiatric meds, mood meds: Please have your primary care physician manage these medications.   Counseling regarding caregiver distress, including caregiver depression, anxiety and issues regarding community resources, adult day care programs, adult living facilities, or memory care questions:   Feel free to contact Misty Lisabeth Register, Social Worker at [phone]   For assessment of decision of mental capacity and competency:  Call Dr. Erick Blinks, geriatric psychiatrist at [phone]  For guidance in geriatric dementia issues please call [facility] [phone]  For guidance regarding [facility] and if placement were needed at the facility, contact Sidney Ace, Social Worker tel: [phone]  If you have any severe symptoms of a stroke, or other severe issues such as confusion,severe chills or fever, etc call 911 or go to the ER as you may need to be evaluated further   Feel free to visit Facebook page " [email or web address]" for tips of how to care for people with memory problems.       RECOMMENDATIONS FOR ALL PATIENTS WITH MEMORY PROBLEMS: 1. Continue to exercise (Recommend 30 minutes of walking everyday, or 3 hours every week) 2. Increase social interactions - continue going to [place] and enjoy social gatherings with friends and family 3. Eat healthy, avoid fried foods and eat more fruits and vegetables 4. Maintain adequate blood pressure, blood sugar, and blood cholesterol level. Reducing the risk of stroke and cardiovascular disease also helps promoting better memory. 5. Avoid stressful situations. Live a simple life and avoid aggravations.  Organize your time and prepare for the next day in anticipation. 6. Sleep well, avoid any interruptions of sleep and avoid any distractions in the bedroom that may interfere with adequate sleep quality 7. Avoid sugar, avoid sweets as there is a strong link between excessive sugar intake, diabetes, and cognitive impairment We discussed the Mediterranean diet, which has been shown to help patients reduce the risk of progressive memory disorders and reduces cardiovascular risk. This includes eating fish, eat fruits and green leafy vegetables, nuts like almonds and hazelnuts, walnuts, and also use olive oil. Avoid fast foods and fried foods as much as possible. Avoid sweets and sugar as sugar use has been linked to worsening of memory function.  There is always a concern of gradual progression of memory problems. If this is the case, then we may need to adjust level of care according to patient needs. Support, both to the patient and caregiver, should then be put into place.    The Alzheimer's Association is here all day, every day for people facing Alzheimer's disease through our free 24/7 Helpline: [phone]. The Helpline provides reliable information and support to all those who need assistance, such as individuals living with memory loss, Alzheimer's or other dementia, caregivers, health care professionals and the public.  Our highly trained and knowledgeable staff can help you with: Understanding memory loss, dementia and Alzheimer's  Medications and other treatment options  General information about aging and brain health  Skills to provide quality care and to find the best care from professionals  Legal, financial and living-arrangement decisions Our Helpline also features: Confidential care consultation provided by [facility]  level clinicians who can help with decision-making support, crisis assistance and education on issues families face every day  Help in a caller's preferred language using  our translation service that features more than 200 languages and dialects  Referrals to local community programs, services and ongoing support     FALL PRECAUTIONS: Be cautious when walking. Scan the area for obstacles that may increase the risk of trips and falls. When getting up in the mornings, sit up at the edge of the bed for a few minutes before getting out of bed. Consider elevating the bed at the head end to avoid drop of blood pressure when getting up. Walk always in a well-lit room (use night lights in the walls). Avoid area rugs or power cords from appliances in the middle of the walkways. Use a walker or a cane if necessary and consider physical therapy for balance exercise. Get your eyesight checked regularly.  FINANCIAL OVERSIGHT: Supervision, especially oversight when making financial decisions or transactions is also recommended.  HOME SAFETY: Consider the safety of the kitchen when operating appliances like stoves, microwave oven, and blender. Consider having supervision and share cooking responsibilities until no longer able to participate in those. Accidents with firearms and other hazards in the house should be identified and addressed as well.   ABILITY TO BE LEFT ALONE: If patient is unable to contact 911 operator, consider using LifeLine, or when the need is there, arrange for someone to stay with patients. Smoking is a fire hazard, consider supervision or cessation. Risk of wandering should be assessed by caregiver and if detected at any point, supervision and safe proof recommendations should be instituted.  MEDICATION SUPERVISION: Inability to self-administer medication needs to be constantly addressed. Implement a mechanism to ensure safe administration of the medications.   DRIVING: Regarding driving, in patients with progressive memory problems, driving will be impaired. We advise to have someone else do the driving if trouble finding directions or if minor accidents  are reported. Independent driving assessment is available to determine safety of driving.     Mediterranean Diet A Mediterranean diet refers to food and lifestyle choices that are based on the traditions of countries located on the [employer]. This way of eating has been shown to help prevent certain conditions and improve outcomes for people who have chronic diseases, like kidney disease and heart disease. What are tips for following this plan? Lifestyle  Cook and eat meals together with your family, when possible. Drink enough fluid to keep your urine clear or pale yellow. Be physically active every day. This includes: Aerobic exercise like running or swimming. Leisure activities like gardening, walking, or housework. Get 7-8 hours of sleep each night. If recommended by your health care provider, drink red wine in moderation. This means 1 glass a day for nonpregnant women and 2 glasses a day for men. A glass of wine equals 5 oz (150 mL). Reading food labels  Check the serving size of packaged foods. For foods such as rice and pasta, the serving size refers to the amount of cooked product, not dry. Check the total fat in packaged foods. Avoid foods that have saturated fat or trans fats. Check the ingredients list for added sugars, such as corn syrup. Shopping  At the grocery store, buy most of your food from the areas near the walls of the store. This includes: Fresh fruits and vegetables (produce). Grains, beans, nuts, and seeds. Some of these may be available in unpackaged forms or large  amounts (in bulk). Fresh seafood. Poultry and eggs. Low-fat dairy products. Buy whole ingredients instead of prepackaged foods. Buy fresh fruits and vegetables in-season from local farmers markets. Buy frozen fruits and vegetables in resealable bags. If you do not have access to quality fresh seafood, buy precooked frozen shrimp or canned fish, such as tuna, salmon, or sardines. Buy small  amounts of raw or cooked vegetables, salads, or olives from the deli or salad bar at your store. Stock your pantry so you always have certain foods on hand, such as olive oil, canned tuna, canned tomatoes, rice, pasta, and beans. Cooking  Cook foods with extra-virgin olive oil instead of using butter or other vegetable oils. Have meat as a side dish, and have vegetables or grains as your main dish. This means having meat in small portions or adding small amounts of meat to foods like pasta or stew. Use beans or vegetables instead of meat in common dishes like chili or lasagna. Experiment with different cooking methods. Try roasting or broiling vegetables instead of steaming or sauteing them. Add frozen vegetables to soups, stews, pasta, or rice. Add nuts or seeds for added healthy fat at each meal. You can add these to yogurt, salads, or vegetable dishes. Marinate fish or vegetables using olive oil, lemon juice, garlic, and fresh herbs. Meal planning  Plan to eat 1 vegetarian meal one day each week. Try to work up to 2 vegetarian meals, if possible. Eat seafood 2 or more times a week. Have healthy snacks readily available, such as: Vegetable sticks with hummus. Greek yogurt. Fruit and nut trail mix. Eat balanced meals throughout the week. This includes: Fruit: 2-3 servings a day Vegetables: 4-5 servings a day Low-fat dairy: 2 servings a day Fish, poultry, or lean meat: 1 serving a day Beans and legumes: 2 or more servings a week Nuts and seeds: 1-2 servings a day Whole grains: 6-8 servings a day Extra-virgin olive oil: 3-4 servings a day Limit red meat and sweets to only a few servings a month What are my food choices? Mediterranean diet Recommended Grains: Whole-grain pasta. Brown rice. Bulgar wheat. Polenta. Couscous. Whole-wheat bread. Orpah Cobb. Vegetables: Artichokes. Beets. Broccoli. Cabbage. Carrots. Eggplant. Green beans. Chard. Kale. Spinach. Onions. Leeks. Peas.  Squash. Tomatoes. Peppers. Radishes. Fruits: Apples. Apricots. Avocado. Berries. Bananas. Cherries. Dates. Figs. Grapes. Lemons. Melon. Oranges. Peaches. Plums. Pomegranate. Meats and other protein foods: Beans. Almonds. Sunflower seeds. Pine nuts. Peanuts. Cod. Salmon. Scallops. Shrimp. Tuna. Tilapia. Clams. Oysters. Eggs. Dairy: Low-fat milk. Cheese. Greek yogurt. Beverages: Water. Red wine. Herbal tea. Fats and oils: Extra virgin olive oil. Avocado oil. Grape seed oil. Sweets and desserts: [place] yogurt with honey. Baked apples. Poached pears. Trail mix. Seasoning and other foods: Basil. Cilantro. Coriander. Cumin. Mint. Parsley. Sage. Rosemary. Tarragon. Garlic. Oregano. Thyme. Pepper. Balsalmic vinegar. Tahini. Hummus. Tomato sauce. Olives. Mushrooms. Limit these Grains: Prepackaged pasta or rice dishes. Prepackaged cereal with added sugar. Vegetables: Deep fried potatoes (french fries). Fruits: Fruit canned in syrup. Meats and other protein foods: Beef. Pork. Lamb. Poultry with skin. Hot dogs. Tomasa Blase. Dairy: Ice cream. Sour cream. Whole milk. Beverages: Juice. Sugar-sweetened soft drinks. Beer. Liquor and spirits. Fats and oils: Butter. Canola oil. Vegetable oil. Beef fat (tallow). Lard. Sweets and desserts: Cookies. Cakes. Pies. Candy. Seasoning and other foods: Mayonnaise. Premade sauces and marinades. The items listed may not be a complete list. Talk with your dietitian about what dietary choices are right for you. Summary The Mediterranean diet includes both food and  lifestyle choices. Eat a variety of fresh fruits and vegetables, beans, nuts, seeds, and whole grains. Limit the amount of red meat and sweets that you eat. Talk with your health care provider about whether it is safe for you to drink red wine in moderation. This means 1 glass a day for nonpregnant women and 2 glasses a day for men. A glass of wine equals 5 oz (150 mL). This information is not intended to replace advice  given to you by your health care provider. Make sure you discuss any questions you have with your health care provider. Document Released: 08/08/2016 Document Revised: 09/10/2016 Document Reviewed: 08/08/2016 Elsevier Interactive Patient Education  2017 [employer].

## 2022-07-18 DIAGNOSIS — K219 Gastro-esophageal reflux disease without esophagitis: Secondary | ICD-10-CM | POA: Diagnosis not present

## 2022-07-18 DIAGNOSIS — D509 Iron deficiency anemia, unspecified: Secondary | ICD-10-CM | POA: Diagnosis not present

## 2022-07-18 DIAGNOSIS — E1169 Type 2 diabetes mellitus with other specified complication: Secondary | ICD-10-CM | POA: Diagnosis not present

## 2022-07-18 DIAGNOSIS — E039 Hypothyroidism, unspecified: Secondary | ICD-10-CM | POA: Diagnosis not present

## 2022-07-18 DIAGNOSIS — I10 Essential (primary) hypertension: Secondary | ICD-10-CM | POA: Diagnosis not present

## 2022-07-18 DIAGNOSIS — E7800 Pure hypercholesterolemia, unspecified: Secondary | ICD-10-CM | POA: Diagnosis not present

## 2022-07-18 DIAGNOSIS — G309 Alzheimer's disease, unspecified: Secondary | ICD-10-CM | POA: Diagnosis not present

## 2022-07-18 DIAGNOSIS — M109 Gout, unspecified: Secondary | ICD-10-CM | POA: Diagnosis not present

## 2022-07-18 DIAGNOSIS — F0280 Dementia in other diseases classified elsewhere without behavioral disturbance: Secondary | ICD-10-CM | POA: Diagnosis not present

## 2022-07-18 DIAGNOSIS — Z7984 Long term (current) use of oral hypoglycemic drugs: Secondary | ICD-10-CM | POA: Diagnosis not present

## 2022-07-18 DIAGNOSIS — R519 Headache, unspecified: Secondary | ICD-10-CM | POA: Diagnosis not present

## 2022-07-18 DIAGNOSIS — Z79899 Other long term (current) drug therapy: Secondary | ICD-10-CM | POA: Diagnosis not present

## 2022-07-26 ENCOUNTER — Other Ambulatory Visit: Payer: Medicare HMO

## 2022-07-26 ENCOUNTER — Inpatient Hospital Stay: Payer: Medicare HMO | Attending: Physician Assistant

## 2022-07-26 ENCOUNTER — Other Ambulatory Visit: Payer: Self-pay

## 2022-07-26 ENCOUNTER — Ambulatory Visit: Payer: Medicare HMO | Admitting: Oncology

## 2022-07-26 ENCOUNTER — Inpatient Hospital Stay: Payer: Medicare HMO | Admitting: Physician Assistant

## 2022-07-26 VITALS — BP 179/72 | HR 75 | Temp 97.7°F | Resp 15 | Wt 165.2 lb

## 2022-07-26 DIAGNOSIS — Z8719 Personal history of other diseases of the digestive system: Secondary | ICD-10-CM | POA: Insufficient documentation

## 2022-07-26 DIAGNOSIS — I10 Essential (primary) hypertension: Secondary | ICD-10-CM | POA: Diagnosis not present

## 2022-07-26 DIAGNOSIS — D509 Iron deficiency anemia, unspecified: Secondary | ICD-10-CM

## 2022-07-26 DIAGNOSIS — R69 Illness, unspecified: Secondary | ICD-10-CM | POA: Diagnosis not present

## 2022-07-26 DIAGNOSIS — E119 Type 2 diabetes mellitus without complications: Secondary | ICD-10-CM | POA: Diagnosis not present

## 2022-07-26 DIAGNOSIS — M1811 Unilateral primary osteoarthritis of first carpometacarpal joint, right hand: Secondary | ICD-10-CM | POA: Insufficient documentation

## 2022-07-26 DIAGNOSIS — F0280 Dementia in other diseases classified elsewhere without behavioral disturbance: Secondary | ICD-10-CM | POA: Insufficient documentation

## 2022-07-26 DIAGNOSIS — E039 Hypothyroidism, unspecified: Secondary | ICD-10-CM | POA: Diagnosis not present

## 2022-07-26 DIAGNOSIS — Z803 Family history of malignant neoplasm of breast: Secondary | ICD-10-CM | POA: Insufficient documentation

## 2022-07-26 DIAGNOSIS — K219 Gastro-esophageal reflux disease without esophagitis: Secondary | ICD-10-CM | POA: Insufficient documentation

## 2022-07-26 DIAGNOSIS — Z801 Family history of malignant neoplasm of trachea, bronchus and lung: Secondary | ICD-10-CM | POA: Diagnosis not present

## 2022-07-26 LAB — IRON AND IRON BINDING CAPACITY (CC-WL,HP ONLY)
Iron: 140 ug/dL (ref 28–170)
Saturation Ratios: 45 % — ABNORMAL HIGH (ref 10.4–31.8)
TIBC: 312 ug/dL (ref 250–450)
UIBC: 172 ug/dL (ref 148–442)

## 2022-07-26 LAB — CBC WITH DIFFERENTIAL (CANCER CENTER ONLY)
Abs Immature Granulocytes: 0.03 10*3/uL (ref 0.00–0.07)
Basophils Absolute: 0 10*3/uL (ref 0.0–0.1)
Basophils Relative: 0 %
Eosinophils Absolute: 0.1 10*3/uL (ref 0.0–0.5)
Eosinophils Relative: 1 %
HCT: 38.1 % (ref 36.0–46.0)
Hemoglobin: 13.2 g/dL (ref 12.0–15.0)
Immature Granulocytes: 1 %
Lymphocytes Relative: 29 %
Lymphs Abs: 1.7 10*3/uL (ref 0.7–4.0)
MCH: 30.6 pg (ref 26.0–34.0)
MCHC: 34.6 g/dL (ref 30.0–36.0)
MCV: 88.4 fL (ref 80.0–100.0)
Monocytes Absolute: 0.5 10*3/uL (ref 0.1–1.0)
Monocytes Relative: 8 %
Neutro Abs: 3.6 10*3/uL (ref 1.7–7.7)
Neutrophils Relative %: 61 %
Platelet Count: 208 10*3/uL (ref 150–400)
RBC: 4.31 MIL/uL (ref 3.87–5.11)
RDW: 14.7 % (ref 11.5–15.5)
WBC Count: 6 10*3/uL (ref 4.0–10.5)
nRBC: 0 % (ref 0.0–0.2)

## 2022-07-26 LAB — FERRITIN: Ferritin: 24 ng/mL (ref 11–307)
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
## 2022-07-29 ENCOUNTER — Telehealth: Payer: Self-pay | Admitting: Physician Assistant

## 2022-07-29 NOTE — Telephone Encounter (Signed)
Per 7/28 los called and spoke to pt about appointment  pt confirmed appointment  

## 2022-07-30 ENCOUNTER — Telehealth: Payer: Self-pay | Admitting: Rheumatology

## 2022-07-30 NOTE — Telephone Encounter (Unsigned)
Patient called requesting prescription refill of Colchicine to be sent to [facility].

## 2022-08-01 NOTE — Telephone Encounter (Signed)
Spoke with Lexi at the [facility]. She states they have received prescription and will work on getting it shipped out to patient. Spoke with patient and advised prescription had been sent a received by Raelene Bott. Advised patient that they should be sending the medication to her. Patient provided number to Raelene Bott so she may reach out if she does not receive the medication in the next few days. Patient advised to reach out to the office if she has any trouble.

## 2022-08-05 ENCOUNTER — Telehealth: Payer: Self-pay | Admitting: Rheumatology

## 2022-08-05 NOTE — Telephone Encounter (Signed)
(  [phone], number for [facility].   Clarified with Candy, pharmacist that patient takes Colchicine once daily as needed. She states she will note chart and will get medication shipped to patient.

## 2022-08-05 NOTE — Telephone Encounter (Signed)
[facility] called the office stating they received a refill for Colchicine. They state they need a frequency on that prescription so they know how many to dispense. [phone]

## 2022-08-08 ENCOUNTER — Ambulatory Visit: Payer: Medicare HMO | Admitting: Rheumatology

## 2022-08-08 DIAGNOSIS — I7300 Raynaud's syndrome without gangrene: Secondary | ICD-10-CM

## 2022-08-08 DIAGNOSIS — M7061 Trochanteric bursitis, right hip: Secondary | ICD-10-CM

## 2022-08-08 DIAGNOSIS — M4004 Postural kyphosis, thoracic region: Secondary | ICD-10-CM

## 2022-08-08 DIAGNOSIS — Z8639 Personal history of other endocrine, nutritional and metabolic disease: Secondary | ICD-10-CM

## 2022-08-08 DIAGNOSIS — E790 Hyperuricemia without signs of inflammatory arthritis and tophaceous disease: Secondary | ICD-10-CM

## 2022-08-08 DIAGNOSIS — Z8679 Personal history of other diseases of the circulatory system: Secondary | ICD-10-CM

## 2022-08-08 DIAGNOSIS — M1A0720 Idiopathic chronic gout, left ankle and foot, without tophus (tophi): Secondary | ICD-10-CM

## 2022-08-08 DIAGNOSIS — L821 Other seborrheic keratosis: Secondary | ICD-10-CM

## 2022-08-08 DIAGNOSIS — M349 Systemic sclerosis, unspecified: Secondary | ICD-10-CM

## 2022-08-08 DIAGNOSIS — M810 Age-related osteoporosis without current pathological fracture: Secondary | ICD-10-CM

## 2022-08-19 DIAGNOSIS — R21 Rash and other nonspecific skin eruption: Secondary | ICD-10-CM | POA: Diagnosis not present

## 2022-09-03 DIAGNOSIS — Z03818 Encounter for observation for suspected exposure to other biological agents ruled out: Secondary | ICD-10-CM | POA: Diagnosis not present

## 2022-09-03 DIAGNOSIS — R509 Fever, unspecified: Secondary | ICD-10-CM | POA: Diagnosis not present

## 2022-09-03 DIAGNOSIS — R5383 Other fatigue: Secondary | ICD-10-CM | POA: Diagnosis not present

## 2022-09-03 DIAGNOSIS — R059 Cough, unspecified: Secondary | ICD-10-CM | POA: Diagnosis not present

## 2022-09-03 DIAGNOSIS — R52 Pain, unspecified: Secondary | ICD-10-CM | POA: Diagnosis not present

## 2022-10-03 ENCOUNTER — Encounter: Payer: Self-pay | Admitting: Neurology

## 2022-10-03 ENCOUNTER — Ambulatory Visit: Payer: Medicare HMO | Admitting: Neurology

## 2022-10-03 VITALS — BP 165/83 | HR 83 | Ht 67.0 in | Wt 162.8 lb

## 2022-10-03 DIAGNOSIS — G44309 Post-traumatic headache, unspecified, not intractable: Secondary | ICD-10-CM | POA: Diagnosis not present
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
## 2022-10-03 NOTE — Patient Instructions (Signed)
Good to see you doing well. Continue with regular exercise. Follow-up as scheduled with Sharene Butters in 3 months, call for any changes

## 2022-10-04 ENCOUNTER — Telehealth: Payer: Self-pay | Admitting: Oncology

## 2022-10-04 NOTE — Telephone Encounter (Signed)
Left patient a voicemail regarding 11/1 rescheduled appointment

## 2022-10-17 ENCOUNTER — Telehealth: Payer: Self-pay | Admitting: Anesthesiology
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
## 2022-10-18 NOTE — Telephone Encounter (Signed)
There is a lot of room to increase Gabapentin, as long as she is not having side effects (such as drowsiness). Increase Gabapentin 100mg : take 2 caps at bedtime. Pls send in updated Rx, thanks

## 2022-10-18 NOTE — Telephone Encounter (Signed)
On her last visit, they reported her headaches had stopped so she had discontinued the gabapentin. With recurrence of headaches, she will need to restart the gabapentin 100mg  qhs, thanks

## 2022-10-18 NOTE — Telephone Encounter (Signed)
Husband called back, patient has been on Gabapentin 100mg  at nite, x 1 week. Now on bilteral temples, wants another recommendation, thank you.

## 2022-10-18 NOTE — Telephone Encounter (Signed)
Pls send another Rx if needed,thanks

## 2022-10-18 NOTE — Telephone Encounter (Signed)
Mychart message sent.

## 2022-10-18 NOTE — Telephone Encounter (Signed)
Advised to Patient and husband, she just had Rx filled, he will use the rx he has and will update office in a week with an update.

## 2022-10-22 DIAGNOSIS — S62317A Displaced fracture of base of fifth metacarpal bone. left hand, initial encounter for closed fracture: Secondary | ICD-10-CM | POA: Diagnosis not present

## 2022-10-23 ENCOUNTER — Telehealth: Payer: Self-pay | Admitting: Physician Assistant

## 2022-10-23 NOTE — Telephone Encounter (Signed)
Patient's spouse called and said the patient's headaches have returned and he'd like to speak with [facility].

## 2022-10-23 NOTE — Telephone Encounter (Signed)
Just spoke with patient and she is being seen at this moment, signing papers, thanked me for calling.

## 2022-10-24 NOTE — Telephone Encounter (Signed)
Pt's husband called in and left a message. He stated he never heard back from [place]. She is still having headaches.

## 2022-10-24 NOTE — Telephone Encounter (Signed)
Pls see what memory deficits he is noticing. He can go back to 100mg  qhs since she had good response last time and may take Tylenol or Ibuprofen as needed as well. Is she sleeping okay? Any increase in stress or other possible triggers for the increase in headaches? Will put her on cancellation list to discuss concerns, thanks

## 2022-10-24 NOTE — Telephone Encounter (Signed)
Spoke with Andrea Heath, seems to be sleeping okay, no new triggers, will place on cancellation list per Dr.Aquino

## 2022-10-24 NOTE — Telephone Encounter (Signed)
Patient has been added to the wait list

## 2022-10-24 NOTE — Telephone Encounter (Signed)
Patient is taken Gabapentin 200mg  at hs, headaches still continue, Husband is concerned about possible more memory deficits with the Gabapentin. Please advise. Thank you

## 2022-10-25 DIAGNOSIS — M25642 Stiffness of left hand, not elsewhere classified: Secondary | ICD-10-CM | POA: Diagnosis not present

## 2022-10-29 ENCOUNTER — Other Ambulatory Visit: Payer: Medicare HMO

## 2022-10-29 ENCOUNTER — Telehealth: Payer: Self-pay | Admitting: Pharmacist

## 2022-10-29 ENCOUNTER — Ambulatory Visit: Payer: Medicare HMO | Admitting: Oncology

## 2022-10-29 NOTE — Telephone Encounter (Signed)
Received fax from [employer] [employer] pt assistance) that patient's [employer] patient assistance is set to expire and requires renewal application to be submitted before the end of the year.  ATC patient regarding her portion of application and if she'd like it mailed.  Provider portion placed in Dr. Arlean Hopping folder for signature. Patient portion will be mailed to her home today.  Knox Saliva, PharmD, MPH, BCPS, CPP Clinical Pharmacist (Rheumatology and Pulmonology)

## 2022-10-30 ENCOUNTER — Ambulatory Visit: Payer: Medicare HMO | Admitting: Oncology

## 2022-10-30 ENCOUNTER — Inpatient Hospital Stay: Payer: Medicare HMO | Admitting: Oncology

## 2022-10-30 ENCOUNTER — Other Ambulatory Visit: Payer: Medicare HMO

## 2022-10-30 ENCOUNTER — Inpatient Hospital Stay: Payer: Medicare HMO

## 2022-10-31 DIAGNOSIS — M79642 Pain in left hand: Secondary | ICD-10-CM | POA: Diagnosis not present

## 2022-11-01 ENCOUNTER — Other Ambulatory Visit: Payer: Self-pay | Admitting: Physician Assistant

## 2022-11-04 NOTE — Telephone Encounter (Signed)
Signed provider portion of [employer] pt assistance for [employer] received. Pending patient portion  Knox Saliva, PharmD, MPH, BCPS, CPP Clinical Pharmacist (Rheumatology and Pulmonology)

## 2022-11-07 DIAGNOSIS — M79642 Pain in left hand: Secondary | ICD-10-CM | POA: Diagnosis not present

## 2022-11-14 DIAGNOSIS — M79642 Pain in left hand: Secondary | ICD-10-CM | POA: Diagnosis not present

## 2022-11-19 DIAGNOSIS — M79642 Pain in left hand: Secondary | ICD-10-CM | POA: Diagnosis not present
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
## 2022-11-26 ENCOUNTER — Other Ambulatory Visit (HOSPITAL_COMMUNITY): Payer: Self-pay

## 2022-11-26 NOTE — Telephone Encounter (Signed)
LVM with patient to confirm that she received her [employer] PAP documents.   Georgeann Oppenheim [employer] PharmD Candidate [phone]

## 2022-11-28 ENCOUNTER — Telehealth: Payer: Self-pay | Admitting: Physician Assistant

## 2022-11-28 DIAGNOSIS — G44209 Tension-type headache, unspecified, not intractable: Secondary | ICD-10-CM

## 2022-11-28 DIAGNOSIS — G44309 Post-traumatic headache, unspecified, not intractable: Secondary | ICD-10-CM

## 2022-11-28 NOTE — Telephone Encounter (Signed)
Close encounter 

## 2022-11-28 NOTE — Telephone Encounter (Signed)
Patient husband called in and states that patient woke up this morning with a really bad headache and he would like to know if we could call something in for patient or what to do to help her with this problem  please call he would like to speak to [facility]   They use [facility]

## 2022-11-28 NOTE — Telephone Encounter (Signed)
No answer at 10:36am 11/28/2022

## 2022-11-28 NOTE — Telephone Encounter (Signed)
Pt called in returning [name]'s call 

## 2022-11-29 DIAGNOSIS — M25642 Stiffness of left hand, not elsewhere classified: Secondary | ICD-10-CM | POA: Diagnosis not present

## 2022-11-29 MED ORDER — GABAPENTIN 100 MG PO CAPS: 200.0000 mg | ORAL_CAPSULE | Freq: Every day | ORAL | 2 refills | Status: DC

## 2022-11-29 NOTE — Telephone Encounter (Signed)
Returned patient's call. She continues to have off and on headaches. She takes gabapentin 100 mg qhs daily. She had a very bad headache yesterday. Patient's husband was asking about going up on gabapentin to 200 mg to see if this would help. She is currently having no side effects on 100 mg.  I agreed and sent a new prescription for gabapentin 200 mg qhs to her pharmacy.  All questions were answered.  Jacquelyne Balint, MD [facility]

## 2022-12-03 ENCOUNTER — Encounter: Payer: Self-pay | Admitting: Rheumatology

## 2022-12-03 ENCOUNTER — Ambulatory Visit: Payer: Medicare HMO | Attending: Rheumatology | Admitting: Rheumatology

## 2022-12-03 VITALS — BP 137/76 | HR 79 | Resp 16 | Ht 67.5 in | Wt 159.0 lb

## 2022-12-03 DIAGNOSIS — R5383 Other fatigue: Secondary | ICD-10-CM | POA: Diagnosis not present

## 2022-12-03 DIAGNOSIS — M7062 Trochanteric bursitis, left hip: Secondary | ICD-10-CM

## 2022-12-03 DIAGNOSIS — Z8679 Personal history of other diseases of the circulatory system: Secondary | ICD-10-CM | POA: Diagnosis not present

## 2022-12-03 DIAGNOSIS — L821 Other seborrheic keratosis: Secondary | ICD-10-CM | POA: Diagnosis not present

## 2022-12-03 DIAGNOSIS — M1A0720 Idiopathic chronic gout, left ankle and foot, without tophus (tophi): Secondary | ICD-10-CM | POA: Diagnosis not present

## 2022-12-03 DIAGNOSIS — I7300 Raynaud's syndrome without gangrene: Secondary | ICD-10-CM | POA: Diagnosis not present

## 2022-12-03 DIAGNOSIS — M810 Age-related osteoporosis without current pathological fracture: Secondary | ICD-10-CM | POA: Diagnosis not present

## 2022-12-03 DIAGNOSIS — Z8639 Personal history of other endocrine, nutritional and metabolic disease: Secondary | ICD-10-CM | POA: Diagnosis not present

## 2022-12-03 DIAGNOSIS — M4004 Postural kyphosis, thoracic region: Secondary | ICD-10-CM | POA: Diagnosis not present

## 2022-12-03 DIAGNOSIS — E790 Hyperuricemia without signs of inflammatory arthritis and tophaceous disease: Secondary | ICD-10-CM

## 2022-12-03 DIAGNOSIS — M79642 Pain in left hand: Secondary | ICD-10-CM | POA: Diagnosis not present

## 2022-12-03 DIAGNOSIS — M7061 Trochanteric bursitis, right hip: Secondary | ICD-10-CM

## 2022-12-03 DIAGNOSIS — M349 Systemic sclerosis, unspecified: Secondary | ICD-10-CM

## 2022-12-03 DIAGNOSIS — S62317A Displaced fracture of base of fifth metacarpal bone. left hand, initial encounter for closed fracture: Secondary | ICD-10-CM | POA: Diagnosis not present

## 2022-12-03 NOTE — Patient Instructions (Addendum)
  Please monitor your blood pressure twice a week and keep a record.  Please take readings to your PCP.  Please schedule an appointment with Dr. Gala Romney [phone]   Please schedule an appointment with Dr. Isaiah Serge [phone]    Schedule a DEXA scan through your PCP.

## 2022-12-03 NOTE — Progress Notes (Signed)
CBC is normal.  UA showed few white cells and few bacteria.  If patient is having symptoms of UTI she should see her PCP.

## 2022-12-04 LAB — URINALYSIS, ROUTINE W REFLEX MICROSCOPIC
Bilirubin Urine: NEGATIVE
Glucose, UA: NEGATIVE
Hyaline Cast: NONE SEEN /LPF
Ketones, ur: NEGATIVE
Nitrite: NEGATIVE
Protein, ur: NEGATIVE
Specific Gravity, Urine: 1.018 (ref 1.001–1.035)
pH: <=5 (ref 5.0–8.0)

## 2022-12-04 LAB — COMPLETE METABOLIC PANEL WITH GFR
AG Ratio: 1.6 (calc) (ref 1.0–2.5)
ALT: 18 U/L (ref 6–29)
AST: 16 U/L (ref 10–35)
Albumin: 4.1 g/dL (ref 3.6–5.1)
Alkaline phosphatase (APISO): 57 U/L (ref 37–153)
BUN: 21 mg/dL (ref 7–25)
CO2: 24 mmol/L (ref 20–32)
Calcium: 9.4 mg/dL (ref 8.6–10.4)
Chloride: 103 mmol/L (ref 98–110)
Creat: 0.97 mg/dL (ref 0.60–1.00)
Globulin: 2.6 g/dL (calc) (ref 1.9–3.7)
Glucose, Bld: 127 mg/dL — ABNORMAL HIGH (ref 65–99)
Potassium: 4.8 mmol/L (ref 3.5–5.3)
Sodium: 135 mmol/L (ref 135–146)
Total Bilirubin: 0.5 mg/dL (ref 0.2–1.2)
Total Protein: 6.7 g/dL (ref 6.1–8.1)
eGFR: 60 mL/min/{1.73_m2} (ref >=60)

## 2022-12-04 LAB — CBC WITH DIFFERENTIAL/PLATELET
Absolute Monocytes: 441 cells/uL (ref 200–950)
Basophils Absolute: 18 cells/uL (ref 0–200)
Basophils Relative: 0.4 %
Eosinophils Absolute: 81 cells/uL (ref 15–500)
Eosinophils Relative: 1.8 %
HCT: 35.6 % (ref 35.0–45.0)
Hemoglobin: 12.3 g/dL (ref 11.7–15.5)
Lymphs Abs: 1148 cells/uL (ref 850–3900)
MCH: 31.8 pg (ref 27.0–33.0)
MCHC: 34.6 g/dL (ref 32.0–36.0)
MCV: 92 fL (ref 80.0–100.0)
MPV: 11.7 fL (ref 7.5–12.5)
Monocytes Relative: 9.8 %
Neutro Abs: 2813 cells/uL (ref 1500–7800)
Neutrophils Relative %: 62.5 %
Platelets: 197 10*3/uL (ref 140–400)
RBC: 3.87 10*6/uL (ref 3.80–5.10)
RDW: 12.4 % (ref 11.0–15.0)
Total Lymphocyte: 25.5 %
WBC: 4.5 10*3/uL (ref 3.8–10.8)

## 2022-12-04 LAB — URIC ACID: Uric Acid, Serum: 6.2 mg/dL (ref 2.5–7.0)

## 2022-12-04 LAB — MICROSCOPIC MESSAGE

## 2022-12-04 NOTE — Progress Notes (Signed)
CBC and CMP are normal.  Glucose is mildly elevated, probably not a fasting sample.  Uric acid is 6.2.  The goal is to keep uric acid below 6.  Patient should take allopurinol on a regular basis and stick to low purine diet.

## 2022-12-06 ENCOUNTER — Telehealth: Payer: Self-pay | Admitting: Physician Assistant

## 2022-12-06 NOTE — Telephone Encounter (Signed)
Pt's husband called in stating the pt has a headache this morning and feels they may need to adjust her medication.

## 2022-12-06 NOTE — Telephone Encounter (Signed)
Left detailed message on patients voicemail about medication adjustment.

## 2022-12-06 NOTE — Telephone Encounter (Signed)
Pt's husband called back in wanting an update.

## 2022-12-06 NOTE — Telephone Encounter (Signed)
This is still waiting for a response from Dr.Aquino

## 2022-12-06 NOTE — Telephone Encounter (Signed)
Pls let husband know there is a lot of room to increase the gabapentin as long as she is tolerating it. Pls have him increase gabapentin 100mg : Take 3 caps at night. Pls send in updated Rx, thanks

## 2022-12-06 NOTE — Telephone Encounter (Signed)
gabapentin 200 mg is current dose adjusted by Dr. Loleta Chance last week. Please advise.

## 2022-12-09 ENCOUNTER — Telehealth: Payer: Self-pay
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
## 2022-12-10 NOTE — Telephone Encounter (Signed)
Left message on answering for patient to call back.
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
## 2022-12-12 DIAGNOSIS — E039 Hypothyroidism, unspecified: Secondary | ICD-10-CM | POA: Diagnosis not present

## 2022-12-12 DIAGNOSIS — I10 Essential (primary) hypertension: Secondary | ICD-10-CM | POA: Diagnosis not present

## 2022-12-12 DIAGNOSIS — E1169 Type 2 diabetes mellitus with other specified complication: Secondary | ICD-10-CM | POA: Diagnosis not present

## 2022-12-12 DIAGNOSIS — E7800 Pure hypercholesterolemia, unspecified: Secondary | ICD-10-CM | POA: Diagnosis not present

## 2022-12-12 DIAGNOSIS — G8929 Other chronic pain: Secondary | ICD-10-CM | POA: Diagnosis not present

## 2022-12-12 DIAGNOSIS — K219 Gastro-esophageal reflux disease without esophagitis: Secondary | ICD-10-CM | POA: Diagnosis not present

## 2023-01-02 ENCOUNTER — Other Ambulatory Visit: Payer: Self-pay | Admitting: *Deleted

## 2023-01-02 MED ORDER — COLCHICINE 0.6 MG PO TABS: 0.6000 mg | ORAL_TABLET | ORAL | 0 refills | Status: DC | PRN

## 2023-01-02 NOTE — Telephone Encounter (Signed)
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  Glucose is mildly elevated, probably not a fasting sample.  Uric acid is 6.2.  The goal is to keep uric acid below 6.  Patient should take allopurinol on a regular basis and stick to low purine diet.    Okay to refill colchicine? 

## 2023-01-09 ENCOUNTER — Ambulatory Visit: Payer: Medicare HMO | Admitting: Physician Assistant

## 2023-01-09 ENCOUNTER — Encounter: Payer: Self-pay | Admitting: Physician Assistant

## 2023-01-09 VITALS — BP 128/85 | HR 79 | Resp 20 | Ht 67.5 in | Wt 159.0 lb

## 2023-01-09 DIAGNOSIS — R413 Other amnesia: Secondary | ICD-10-CM | POA: Diagnosis not present

## 2023-01-09 DIAGNOSIS — G44309 Post-traumatic headache, unspecified, not intractable: Secondary | ICD-10-CM | POA: Diagnosis not present

## 2023-01-09 NOTE — Patient Instructions (Signed)
It was a pleasure to see you today at our office.   Recommendations:  Follow up in  6 months Continue rivastigmine 1.5 mg twice a day  Continue gabapentin for your headache , follow up with Dr. Delice Lesch   Follow anxiety with primary doctor      Whom to call:  Memory  decline, memory medications: Call our office [phone]   For psychiatric meds, mood meds: Please have your primary care physician manage these medications.      For assessment of decision of mental capacity and competency:  Call Dr. Anthoney Harada, geriatric psychiatrist at [phone]  For guidance in geriatric dementia issues please call [facility] [phone]    If you have any severe symptoms of a stroke, or other severe issues such as confusion,severe chills or fever, etc call 911 or go to the ER as you may need to be evaluated further   Feel free to visit Facebook page " [email or web address]" for tips of how to care for people with memory problems.       RECOMMENDATIONS FOR ALL PATIENTS WITH MEMORY PROBLEMS: 1. Continue to exercise (Recommend 30 minutes of walking everyday, or 3 hours every week) 2. Increase social interactions - continue going to [place] and enjoy social gatherings with friends and family 3. Eat healthy, avoid fried foods and eat more fruits and vegetables 4. Maintain adequate blood pressure, blood sugar, and blood cholesterol level. Reducing the risk of stroke and cardiovascular disease also helps promoting better memory. 5. Avoid stressful situations. Live a simple life and avoid aggravations. Organize your time and prepare for the next day in anticipation. 6. Sleep well, avoid any interruptions of sleep and avoid any distractions in the bedroom that may interfere with adequate sleep quality 7. Avoid sugar, avoid sweets as there is a strong link between excessive sugar intake, diabetes, and cognitive impairment We discussed the Mediterranean diet, which has been shown to help patients  reduce the risk of progressive memory disorders and reduces cardiovascular risk. This includes eating fish, eat fruits and green leafy vegetables, nuts like almonds and hazelnuts, walnuts, and also use olive oil. Avoid fast foods and fried foods as much as possible. Avoid sweets and sugar as sugar use has been linked to worsening of memory function.  There is always a concern of gradual progression of memory problems. If this is the case, then we may need to adjust level of care according to patient needs. Support, both to the patient and caregiver, should then be put into place.    The [employer] is here all day, every day for people facing Alzheimer's disease through our free 24/7 Helpline: [phone]. The Helpline provides reliable information and support to all those who need assistance, such as individuals living with memory loss, Alzheimer's or other dementia, caregivers, health care professionals and the public.  Our highly trained and knowledgeable staff can help you with: Understanding memory loss, dementia and Alzheimer's  Medications and other treatment options  General information about aging and brain health  Skills to provide quality care and to find the best care from professionals  Legal, financial and living-arrangement decisions Our Helpline also features: Confidential care consultation provided by master's level clinicians who can help with decision-making support, crisis assistance and education on issues families face every day  Help in a caller's preferred language using our translation service that features more than 200 languages and dialects  Referrals to local community programs, services and ongoing support  FALL PRECAUTIONS: Be cautious when walking. Scan the area for obstacles that may increase the risk of trips and falls. When getting up in the mornings, sit up at the edge of the bed for a few minutes before getting out of bed. Consider elevating  the bed at the head end to avoid drop of blood pressure when getting up. Walk always in a well-lit room (use night lights in the walls). Avoid area rugs or power cords from appliances in the middle of the walkways. Use a walker or a cane if necessary and consider physical therapy for balance exercise. Get your eyesight checked regularly.  FINANCIAL OVERSIGHT: Supervision, especially oversight when making financial decisions or transactions is also recommended.  HOME SAFETY: Consider the safety of the kitchen when operating appliances like stoves, microwave oven, and blender. Consider having supervision and share cooking responsibilities until no longer able to participate in those. Accidents with firearms and other hazards in the house should be identified and addressed as well.   ABILITY TO BE LEFT ALONE: If patient is unable to contact 911 operator, consider using LifeLine, or when the need is there, arrange for someone to stay with patients. Smoking is a fire hazard, consider supervision or cessation. Risk of wandering should be assessed by caregiver and if detected at any point, supervision and safe proof recommendations should be instituted.  MEDICATION SUPERVISION: Inability to self-administer medication needs to be constantly addressed. Implement a mechanism to ensure safe administration of the medications.   DRIVING: Regarding driving, in patients with progressive memory problems, driving will be impaired. We advise to have someone else do the driving if trouble finding directions or if minor accidents are reported. Independent driving assessment is available to determine safety of driving.     Mediterranean Diet A Mediterranean diet refers to food and lifestyle choices that are based on the traditions of countries located on the [employer]. This way of eating has been shown to help prevent certain conditions and improve outcomes for people who have chronic diseases, like kidney  disease and heart disease. What are tips for following this plan? Lifestyle  Cook and eat meals together with your family, when possible. Drink enough fluid to keep your urine clear or pale yellow. Be physically active every day. This includes: Aerobic exercise like running or swimming. Leisure activities like gardening, walking, or housework. Get 7-8 hours of sleep each night. If recommended by your health care provider, drink red wine in moderation. This means 1 glass a day for nonpregnant women and 2 glasses a day for men. A glass of wine equals 5 oz (150 mL). Reading food labels  Check the serving size of packaged foods. For foods such as rice and pasta, the serving size refers to the amount of cooked product, not dry. Check the total fat in packaged foods. Avoid foods that have saturated fat or trans fats. Check the ingredients list for added sugars, such as corn syrup. Shopping  At the grocery store, buy most of your food from the areas near the walls of the store. This includes: Fresh fruits and vegetables (produce). Grains, beans, nuts, and seeds. Some of these may be available in unpackaged forms or large amounts (in bulk). Fresh seafood. Poultry and eggs. Low-fat dairy products. Buy whole ingredients instead of prepackaged foods. Buy fresh fruits and vegetables in-season from local farmers markets. Buy frozen fruits and vegetables in resealable bags. If you do not have access to quality fresh seafood, buy precooked frozen shrimp or  canned fish, such as tuna, salmon, or sardines. Buy small amounts of raw or cooked vegetables, salads, or olives from the deli or salad bar at your store. Stock your pantry so you always have certain foods on hand, such as olive oil, canned tuna, canned tomatoes, rice, pasta, and beans. Cooking  Cook foods with extra-virgin olive oil instead of using butter or other vegetable oils. Have meat as a side dish, and have vegetables or grains as your main  dish. This means having meat in small portions or adding small amounts of meat to foods like pasta or stew. Use beans or vegetables instead of meat in common dishes like chili or lasagna. Experiment with different cooking methods. Try roasting or broiling vegetables instead of steaming or sauteing them. Add frozen vegetables to soups, stews, pasta, or rice. Add nuts or seeds for added healthy fat at each meal. You can add these to yogurt, salads, or vegetable dishes. Marinate fish or vegetables using olive oil, lemon juice, garlic, and fresh herbs. Meal planning  Plan to eat 1 vegetarian meal one day each week. Try to work up to 2 vegetarian meals, if possible. Eat seafood 2 or more times a week. Have healthy snacks readily available, such as: Vegetable sticks with hummus. Greek yogurt. Fruit and nut trail mix. Eat balanced meals throughout the week. This includes: Fruit: 2-3 servings a day Vegetables: 4-5 servings a day Low-fat dairy: 2 servings a day Fish, poultry, or lean meat: 1 serving a day Beans and legumes: 2 or more servings a week Nuts and seeds: 1-2 servings a day Whole grains: 6-8 servings a day Extra-virgin olive oil: 3-4 servings a day Limit red meat and sweets to only a few servings a month What are my food choices? Mediterranean diet Recommended Grains: Whole-grain pasta. Brown rice. Bulgar wheat. Polenta. Couscous. Whole-wheat bread. [place]. Vegetables: Artichokes. Beets. Broccoli. Cabbage. Carrots. Eggplant. Green beans. Chard. Kale. Spinach. Onions. Leeks. Peas. Squash. Tomatoes. Peppers. Radishes. Fruits: Apples. Apricots. Avocado. Berries. Bananas. Cherries. Dates. Figs. Grapes. Lemons. Melon. Oranges. Peaches. Plums. Pomegranate. Meats and other protein foods: Beans. Almonds. Sunflower seeds. Pine nuts. Peanuts. [place]. Salmon. Scallops. Shrimp. [place]. Tilapia. Clams. Oysters. Eggs. Dairy: Low-fat milk. Cheese. Greek yogurt. Beverages: Water. Red wine.  Herbal tea. Fats and oils: Extra virgin olive oil. Avocado oil. Grape seed oil. Sweets and desserts: [place] yogurt with honey. Baked apples. Poached pears. Trail mix. Seasoning and other foods: Basil. Cilantro. Coriander. Cumin. Mint. Parsley. Sage. Rosemary. Tarragon. Garlic. Oregano. Thyme. Pepper. Balsalmic vinegar. Tahini. Hummus. Tomato sauce. Olives. Mushrooms. Limit these Grains: Prepackaged pasta or rice dishes. Prepackaged cereal with added sugar. Vegetables: Deep fried potatoes (french fries). Fruits: Fruit canned in syrup. Meats and other protein foods: Beef. Pork. Lamb. Poultry with skin. Hot dogs. [place] Salines. Dairy: Ice cream. Sour cream. Whole milk. Beverages: Juice. Sugar-sweetened soft drinks. Beer. Liquor and spirits. Fats and oils: Butter. Canola oil. Vegetable oil. Beef fat (tallow). Lard. Sweets and desserts: Cookies. Cakes. Pies. Candy. Seasoning and other foods: Mayonnaise. Premade sauces and marinades. The items listed may not be a complete list. Talk with your dietitian about what dietary choices are right for you. Summary The Mediterranean diet includes both food and lifestyle choices. Eat a variety of fresh fruits and vegetables, beans, nuts, seeds, and whole grains. Limit the amount of red meat and sweets that you eat. Talk with your health care provider about whether it is safe for you to drink red wine in moderation. This means 1 glass a  day for nonpregnant women and 2 glasses a day for men. A glass of wine equals 5 oz (150 mL). This information is not intended to replace advice given to you by your health care provider. Make sure you discuss any questions you have with your health care provider. Document Released: 08/08/2016 Document Revised: 09/10/2016 Document Reviewed: 08/08/2016 Elsevier Interactive Patient Education  2017 [employer].
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
## 2023-01-14 ENCOUNTER — Other Ambulatory Visit: Payer: Self-pay | Admitting: *Deleted

## 2023-01-14 NOTE — Telephone Encounter (Signed)
Patient contacted the office and left message stating she needs a refill on her gout medication. Returned call to patient and she states she needs a refill on Colchicine. Prescription was faxed to [facility] at the beginning of January 2024. Provided number to call and set up shipment.

## 2023-01-15 ENCOUNTER — Telehealth: Payer: Self-pay | Admitting: Rheumatology

## 2023-01-15 ENCOUNTER — Other Ambulatory Visit: Payer: Self-pay | Admitting: Rheumatology

## 2023-01-15 DIAGNOSIS — M1A0720 Idiopathic chronic gout, left ankle and foot, without tophus (tophi): Secondary | ICD-10-CM

## 2023-01-15 NOTE — Telephone Encounter (Signed)
Pt's husband, Shanon Brow, called to check the status for the Colchicine refill. He asked if someone could call [phone] to release the prescription. He can be reached at [phone].

## 2023-01-15 NOTE — Telephone Encounter (Signed)
Received notice from front desk that patient's husband called office with [employer], seems they may have submitted patient portion directly to company and are missing the provider portion.  Please fax in provider portion to [place].

## 2023-01-15 NOTE — Telephone Encounter (Signed)
[employer] Phone# [phone]

## 2023-01-15 NOTE — Telephone Encounter (Signed)
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  Glucose is mildly elevated, probably not a fasting sample.  Uric acid is 6.2.  The goal is to keep uric acid below 6.  Patient should take allopurinol on a regular basis and stick to low purine diet.    Okay to refill colchicine?

## 2023-01-15 NOTE — Telephone Encounter (Signed)
Patient wants to know what exact information he needs to submit to get colchicine prescription.  Please contact the patient.

## 2023-01-15 NOTE — Telephone Encounter (Signed)
Patient's husband Ron left a voicemail stating his wife is having trouble getting her gout medication.  Ron states "someone didn't send in [employer] application correctly.  They sent in pages 3 & 4, but didn't include pages 1 & 2 which has messed up the prescription of Colchicine."  Patient requested Dr. Estanislado Pandy to return his call ASAP.  [phone]

## 2023-01-16 IMAGING — MR MR HEAD W/O CM
10 series · 48 of 48 positions shown · non-contrast
Comparison: None.

CLINICAL DATA: Memory loss.  Dementia due to Alzheimer's disease.

EXAM:
MRI HEAD WITHOUT CONTRAST
TECHNIQUE: Multiplanar, multiecho pulse sequences of the brain and surrounding
structures were obtained without intravenous contrast.

[Series 2: T1 · sagittal · 5.0mm · 0.49mm/px · 3 of 24 slices shown]
[im 1/24]
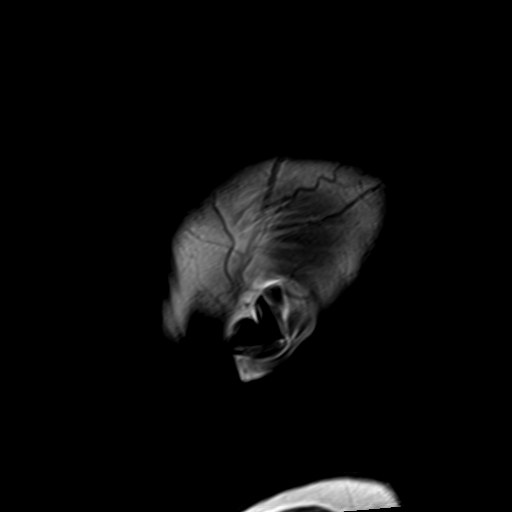
[im 12/24]
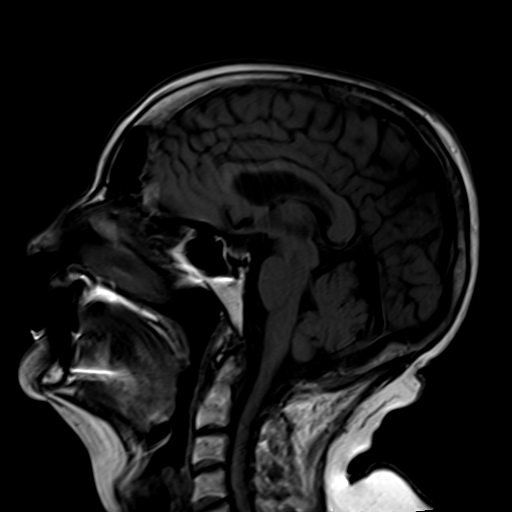
[im 24/24]
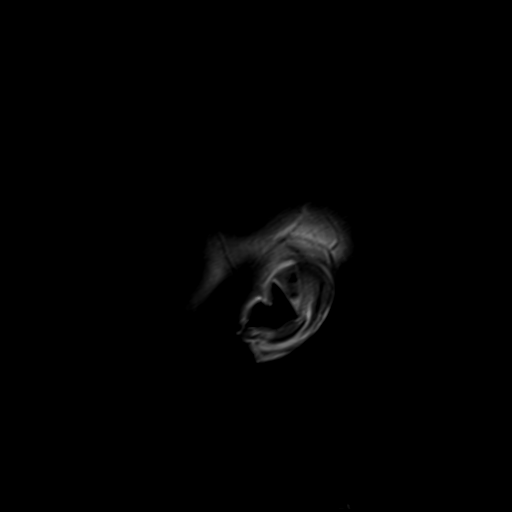

[Series 3: ax ep2d_diff_3 · axial · 3.0mm · 1.80mm/px · z∈[-62,+83]mm · 9 of 106 slices shown]
[im 1/106]
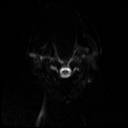
[im 14/106]
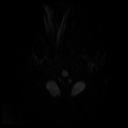
[im 27/106]
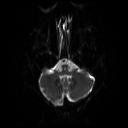
[im 40/106]
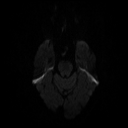
[im 53/106]
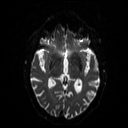
[im 66/106]
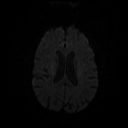
[im 79/106]
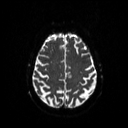
[im 92/106]
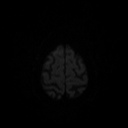
[im 106/106]
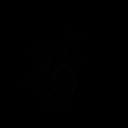

[Series 4: ax ep2d_diff_3_adc · axial · 3.0mm · 1.80mm/px · z∈[-62,+83]mm · 4 of 53 slices shown]
[im 1/53]
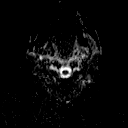
[im 18/53]
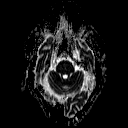
[im 35/53]
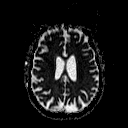
[im 53/53]
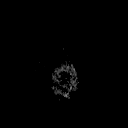

[Series 5: cor ep2d_diff · coronal · 5.0mm · 1.77mm/px · 4 of 56 slices shown]
[im 1/56]
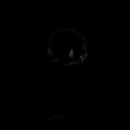
[im 19/56]
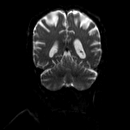
[im 37/56]
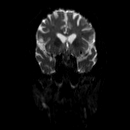
[im 56/56]
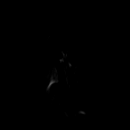

[Series 6: cor ep2d_diff_adc · coronal · 5.0mm · 1.77mm/px · 2 of 29 slices shown]
[im 1/29]
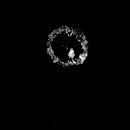
[im 29/29]
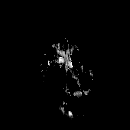

[Series 8: swi_images · axial · 2.0mm · 0.90mm/px · z∈[-47,+96]mm · 6 of 80 slices shown]
[im 1/80]
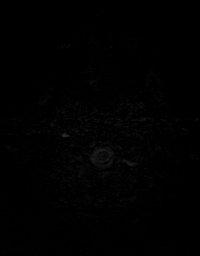
[im 16/80]
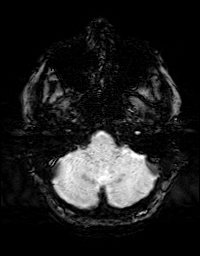
[im 32/80]
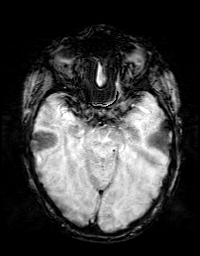
[im 48/80]
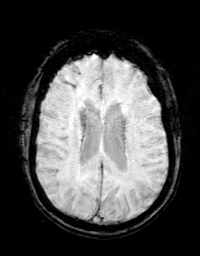
[im 64/80]
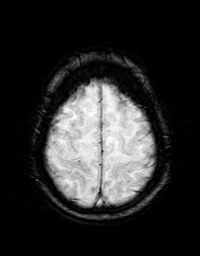
[im 80/80]
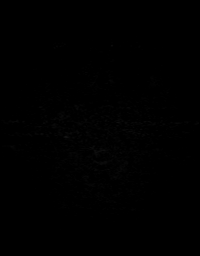

[Series 9: FLAIR · axial · 3.0mm · 0.45mm/px · z∈[-47,+91]mm · 3 of 40 slices shown]
[im 1/40]
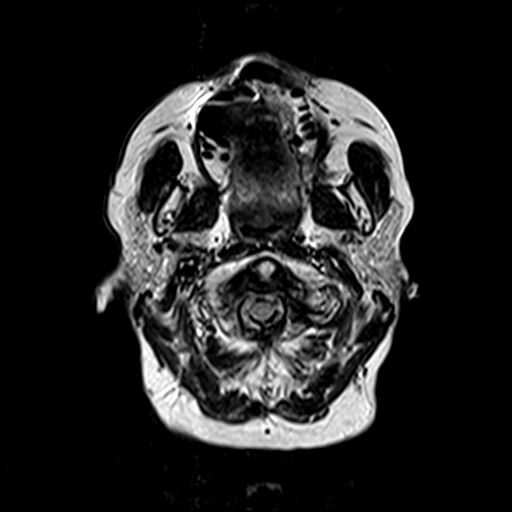
[im 20/40]
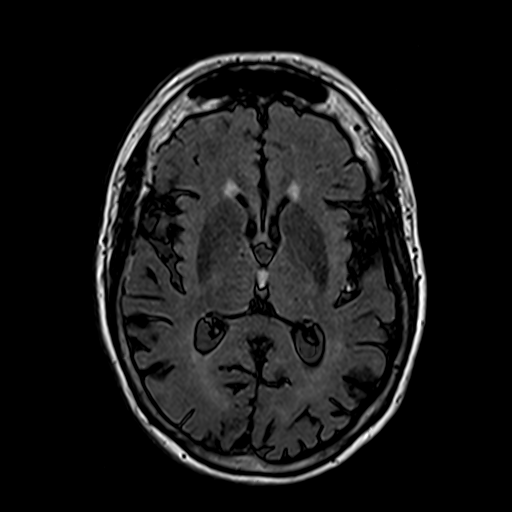
[im 40/40]
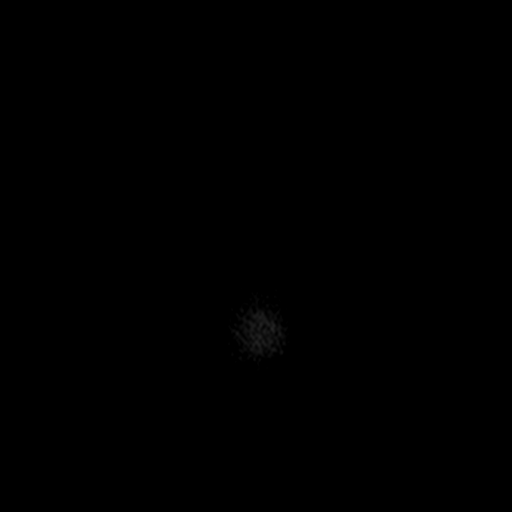

[Series 10: T2 · axial · 5.0mm · 0.60mm/px · z∈[-46,+95]mm · 2 of 27 slices shown (1 of 2)]
[im 1/27]
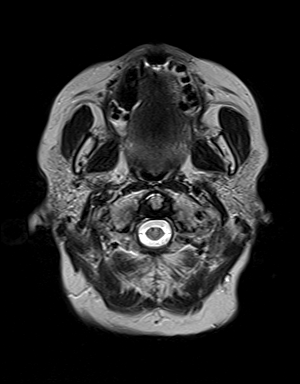
[im 27/27]
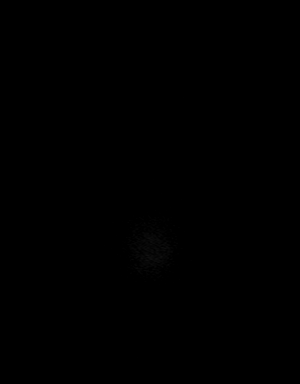

[Series 11: t1_mpr_tra · axial · 1.0mm · 0.72mm/px · z∈[-48,+96]mm · 13 of 160 slices shown]
[im 1/160]
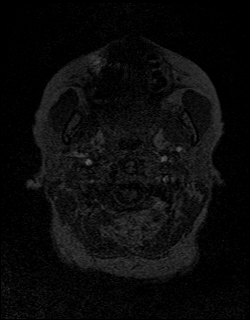
[im 14/160]
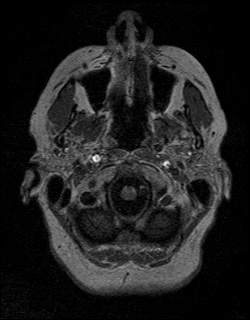
[im 27/160]
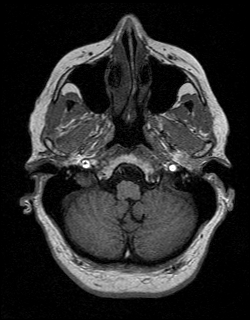
[im 40/160]
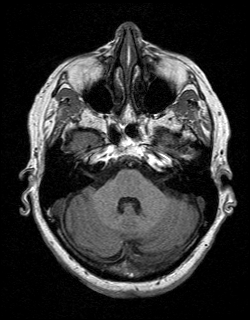
[im 54/160]
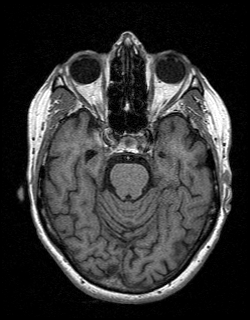
[im 67/160]
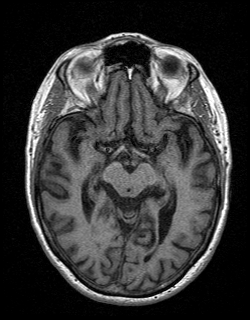
[im 80/160]
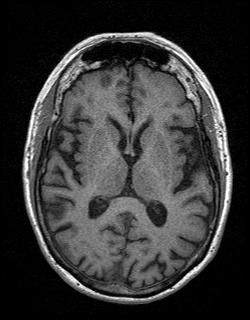
[im 93/160]
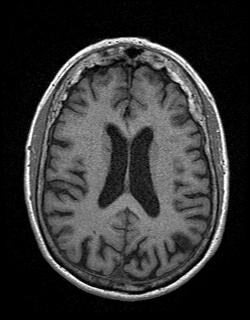
[im 107/160]
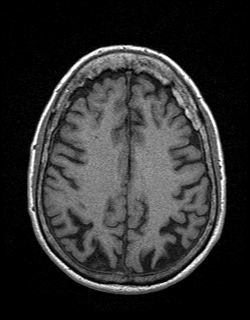
[im 120/160]
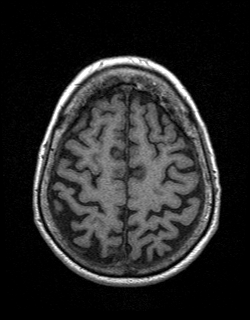
[im 133/160]
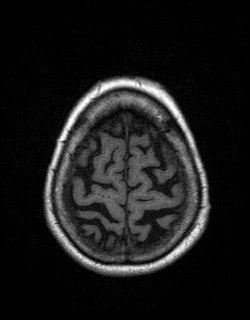
[im 146/160]
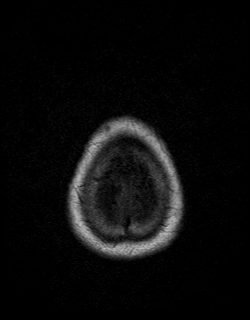
[im 160/160]
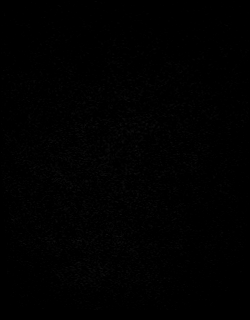

[Series 12: T2 · coronal · 5.0mm · 0.43mm/px · 2 of 29 slices shown (2 of 2)]
[im 1/29]
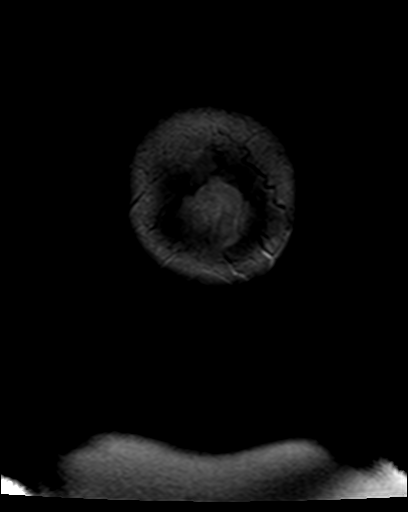
[im 29/29]
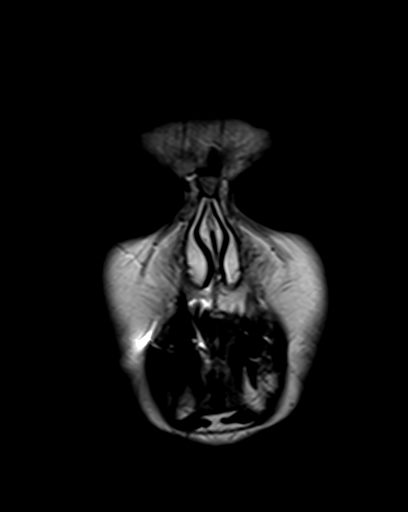

[48 of 48 positions shown; findings below may reference images not displayed]

FINDINGS: Brain: No acute infarct, mass effect or extra-axial collection. Old
blood products over the right frontal lobe. Normal white matter
signal, parenchymal volume and CSF spaces. The midline structures
are normal.

Vascular: Major flow voids are preserved.

Skull and upper cervical spine: Normal calvarium and skull base.
Visualized upper cervical spine and soft tissues are normal.

Sinuses/Orbits:No paranasal sinus fluid levels or advanced mucosal
thickening. No mastoid or middle ear effusion. Normal orbits.
IMPRESSION: 1. No acute intracranial abnormality.
2. Old blood products over the right frontal lobe. Otherwise normal
brain MRI.

## 2023-01-16 MED ORDER — COLCHICINE 0.6 MG PO TABS: 0.6000 mg | ORAL_TABLET | Freq: Every day | ORAL | 0 refills | Status: DC | PRN

## 2023-01-16 NOTE — Telephone Encounter (Signed)
I spoke with patient's husband to determine if they had submitted financial documents with their part of application that they had mailed in. He did not recall then handed phone to patient. The patient states she does not recall what she sent. I advised that the message that was left from them was that pages 1-2 are missing and page 1 is about financial information too. Her husband states that he does not know of they would qualify but I advised that pt has been receiving from program for years and unless they've had change in income, then there shouldn't be issues. He states that she has not taken for years.Neither patient nor her husband appear to be good historians.  Ultimately, her husband decided that he wants the colchicine prescription sent to [facility] and does not want to pursue patient assistance. Provider portion has been sent to scan center today for retention  Knox Saliva, PharmD, MPH, BCPS, CPP Clinical Pharmacist (Rheumatology and Pulmonology)

## 2023-01-16 NOTE — Telephone Encounter (Signed)
Per request of patient's husband, they no longer want to pursue patient assistance. They requested rx for colchicine be sent to [facility].  Knox Saliva, PharmD, MPH, BCPS, CPP Clinical Pharmacist (Rheumatology and Pulmonology)
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
## 2023-01-20 ENCOUNTER — Ambulatory Visit: Payer: Medicare HMO | Attending: Internal Medicine | Admitting: Internal Medicine

## 2023-01-20 ENCOUNTER — Encounter: Payer: Self-pay | Admitting: Internal Medicine

## 2023-01-20 VITALS — BP 134/70 | HR 88 | Ht 67.5 in | Wt 159.2 lb

## 2023-01-20 DIAGNOSIS — E119 Type 2 diabetes mellitus without complications: Secondary | ICD-10-CM | POA: Diagnosis not present

## 2023-01-20 DIAGNOSIS — I7300 Raynaud's syndrome without gangrene: Secondary | ICD-10-CM

## 2023-01-20 DIAGNOSIS — Z79899 Other long term (current) drug therapy: Secondary | ICD-10-CM | POA: Diagnosis not present

## 2023-01-20 DIAGNOSIS — I342 Nonrheumatic mitral (valve) stenosis: Secondary | ICD-10-CM | POA: Diagnosis not present

## 2023-01-20 DIAGNOSIS — E7800 Pure hypercholesterolemia, unspecified: Secondary | ICD-10-CM | POA: Diagnosis not present

## 2023-01-20 DIAGNOSIS — E785 Hyperlipidemia, unspecified: Secondary | ICD-10-CM

## 2023-01-20 DIAGNOSIS — R011 Cardiac murmur, unspecified: Secondary | ICD-10-CM | POA: Diagnosis not present

## 2023-01-20 DIAGNOSIS — E039 Hypothyroidism, unspecified: Secondary | ICD-10-CM | POA: Diagnosis not present

## 2023-01-20 DIAGNOSIS — K219 Gastro-esophageal reflux disease without esophagitis: Secondary | ICD-10-CM | POA: Diagnosis not present

## 2023-01-20 NOTE — Patient Instructions (Signed)
Medication Instructions:   *If you need a refill on your cardiac medications before your next appointment, please call your pharmacy*   Lab Work: LIPID, HGBA1C, BMET, CBC, ANEMIA PANEL  If you have labs (blood work) drawn today and your tests are completely normal, you will receive your results only by: [facility] (if you have MyChart) OR A paper copy in the mail If you have any lab test that is abnormal or we need to change your treatment, we will call you to review the results.   Testing/Procedures: Your physician has requested that you have an echocardiogram. Echocardiography is a painless test that uses sound waves to create images of your heart. It provides your doctor with information about the size and shape of your heart and how well your heart's chambers and valves are working. This procedure takes approximately one hour. There are no restrictions for this procedure. Please do NOT wear cologne, perfume, aftershave, or lotions (deodorant is allowed). Please arrive 15 minutes prior to your appointment time.    Follow-Up: At [facility], you and your health needs are our priority.  As part of our continuing mission to provide you with exceptional heart care, we have created designated Provider Care Teams.  These Care Teams include your primary Cardiologist (physician) and Advanced Practice Providers (APPs -  Physician Assistants and Nurse Practitioners) who all work together to provide you with the care you need, when you need it.  We recommend signing up for the patient portal called "MyChart".  Sign up information is provided on this After Visit Summary.  MyChart is used to connect with patients for Virtual Visits (Telemedicine).  Patients are able to view lab/test results, encounter notes, upcoming appointments, etc.  Non-urgent messages can be sent to your provider as well.   To learn more about what you can do with MyChart, go to [email or web address].    Your  next appointment:   1 year(s)  Provider:   DR Dorris Carnes If Card or EP not listed click to update   DO NOT delete brackets or number around this link :1}   Other Instructions

## 2023-01-21 LAB — LIPID PANEL
Chol/HDL Ratio: 4.1 ratio (ref 0.0–4.4)
Cholesterol, Total: 167 mg/dL (ref 100–199)
HDL: 41 mg/dL (ref >39)
LDL Chol Calc (NIH): 88 mg/dL (ref 0–99)
Triglycerides: 228 mg/dL — ABNORMAL HIGH (ref 0–149)
VLDL Cholesterol Cal: 38 mg/dL (ref 5–40)

## 2023-01-21 LAB — HEMOGLOBIN A1C
Est. average glucose Bld gHb Est-mCnc: 134 mg/dL
Hgb A1c MFr Bld: 6.3 % — ABNORMAL HIGH (ref 4.8–5.6)

## 2023-01-21 LAB — CBC
Hematocrit: 39.7 % (ref 34.0–46.6)
Hemoglobin: 13.8 g/dL (ref 11.1–15.9)
MCH: 32.5 pg (ref 26.6–33.0)
MCHC: 34.8 g/dL (ref 31.5–35.7)
MCV: 94 fL (ref 79–97)
Platelets: 252 10*3/uL (ref 150–450)
RBC: 4.24 x10E6/uL (ref 3.77–5.28)
RDW: 12.2 % (ref 11.7–15.4)
WBC: 7.3 10*3/uL (ref 3.4–10.8)

## 2023-01-21 LAB — BASIC METABOLIC PANEL
BUN/Creatinine Ratio: 21 (ref 12–28)
BUN: 18 mg/dL (ref 8–27)
CO2: 19 mmol/L — ABNORMAL LOW (ref 20–29)
Calcium: 9.5 mg/dL (ref 8.7–10.3)
Chloride: 96 mmol/L (ref 96–106)
Creatinine, Ser: 0.86 mg/dL (ref 0.57–1.00)
Glucose: 148 mg/dL — ABNORMAL HIGH (ref 70–99)
Potassium: 4.4 mmol/L (ref 3.5–5.2)
Sodium: 131 mmol/L — ABNORMAL LOW (ref 134–144)
eGFR: 70 mL/min/{1.73_m2} (ref >59)

## 2023-01-21 LAB — IRON AND TIBC
Iron Saturation: 27 % (ref 15–55)
Iron: 80 ug/dL (ref 27–139)
Total Iron Binding Capacity: 294 ug/dL (ref 250–450)
UIBC: 214 ug/dL (ref 118–369)

## 2023-01-21 LAB — FERRITIN: Ferritin: 71 ng/mL (ref 15–150)

## 2023-01-21 LAB — VITAMIN B12: Vitamin B-12: 1492 pg/mL — ABNORMAL HIGH (ref 232–1245)

## 2023-01-21 LAB — FOLATE: Folate: >20 ng/mL (ref >3.0)

## 2023-01-23 ENCOUNTER — Other Ambulatory Visit: Payer: Self-pay

## 2023-01-23 MED ORDER — EZETIMIBE 10 MG PO TABS: 10.0000 mg | ORAL_TABLET | Freq: Every day | ORAL | 3 refills | Status: AC

## 2023-01-27 ENCOUNTER — Other Ambulatory Visit: Payer: Self-pay | Admitting: Physician Assistant

## 2023-02-13 ENCOUNTER — Ambulatory Visit (HOSPITAL_COMMUNITY): Payer: Medicare HMO | Attending: Cardiology

## 2023-02-13 DIAGNOSIS — I7300 Raynaud's syndrome without gangrene: Secondary | ICD-10-CM | POA: Insufficient documentation

## 2023-02-13 DIAGNOSIS — I342 Nonrheumatic mitral (valve) stenosis: Secondary | ICD-10-CM | POA: Diagnosis not present

## 2023-02-13 DIAGNOSIS — E785 Hyperlipidemia, unspecified: Secondary | ICD-10-CM | POA: Diagnosis not present

## 2023-02-13 DIAGNOSIS — R011 Cardiac murmur, unspecified: Secondary | ICD-10-CM | POA: Diagnosis not present

## 2023-02-13 DIAGNOSIS — Z79899 Other long term (current) drug therapy: Secondary | ICD-10-CM | POA: Diagnosis not present

## 2023-02-13 LAB — ECHOCARDIOGRAM COMPLETE
AR max vel: 1.37 cm2
AV Area VTI: 1.5 cm2
AV Area mean vel: 1.38 cm2
AV Mean grad: 17 mmHg
AV Peak grad: 30.7 mmHg
Ao pk vel: 2.77 m/s
Area-P 1/2: 3.39 cm2
MV VTI: 1.64 cm2
P 1/2 time: 457 msec
S' Lateral: 1.3 cm

## 2023-02-17 ENCOUNTER — Other Ambulatory Visit: Payer: Self-pay

## 2023-02-17 ENCOUNTER — Emergency Department (HOSPITAL_BASED_OUTPATIENT_CLINIC_OR_DEPARTMENT_OTHER)
Admission: EM | Admit: 2023-02-17 | Discharge: 2023-02-17 | Disposition: A | Discharge status: Home / Self Care | Payer: Medicare HMO | Attending: Emergency Medicine | Admitting: Emergency Medicine

## 2023-02-17 ENCOUNTER — Encounter (HOSPITAL_BASED_OUTPATIENT_CLINIC_OR_DEPARTMENT_OTHER): Payer: Self-pay | Admitting: Emergency Medicine

## 2023-02-17 DIAGNOSIS — R197 Diarrhea, unspecified: Secondary | ICD-10-CM | POA: Diagnosis not present

## 2023-02-17 DIAGNOSIS — Z7982 Long term (current) use of aspirin: Secondary | ICD-10-CM | POA: Insufficient documentation

## 2023-02-17 DIAGNOSIS — R531 Weakness: Secondary | ICD-10-CM | POA: Diagnosis not present

## 2023-02-17 DIAGNOSIS — Z79899 Other long term (current) drug therapy: Secondary | ICD-10-CM | POA: Diagnosis not present

## 2023-02-17 DIAGNOSIS — Z7984 Long term (current) use of oral hypoglycemic drugs: Secondary | ICD-10-CM | POA: Diagnosis not present

## 2023-02-17 LAB — URINALYSIS, ROUTINE W REFLEX MICROSCOPIC
Bilirubin Urine: NEGATIVE
Glucose, UA: NEGATIVE mg/dL
Hgb urine dipstick: NEGATIVE
Ketones, ur: NEGATIVE mg/dL
Leukocytes,Ua: NEGATIVE
Nitrite: NEGATIVE
Specific Gravity, Urine: 1.012 (ref 1.005–1.030)
pH: 6 (ref 5.0–8.0)

## 2023-02-17 LAB — CBC
HCT: 40.6 % (ref 36.0–46.0)
Hemoglobin: 14.1 g/dL (ref 12.0–15.0)
MCH: 31 pg (ref 26.0–34.0)
MCHC: 34.7 g/dL (ref 30.0–36.0)
MCV: 89.2 fL (ref 80.0–100.0)
Platelets: 251 10*3/uL (ref 150–400)
RBC: 4.55 MIL/uL (ref 3.87–5.11)
RDW: 12.3 % (ref 11.5–15.5)
WBC: 8.3 10*3/uL (ref 4.0–10.5)
nRBC: 0 % (ref 0.0–0.2)

## 2023-02-17 LAB — COMPREHENSIVE METABOLIC PANEL
ALT: 25 U/L (ref 0–44)
AST: 21 U/L (ref 15–41)
Albumin: 4.6 g/dL (ref 3.5–5.0)
Alkaline Phosphatase: 50 U/L (ref 38–126)
Anion gap: 13 (ref 5–15)
BUN: 12 mg/dL (ref 8–23)
CO2: 21 mmol/L — ABNORMAL LOW (ref 22–32)
Calcium: 9.7 mg/dL (ref 8.9–10.3)
Chloride: 98 mmol/L (ref 98–111)
Creatinine, Ser: 0.8 mg/dL (ref 0.44–1.00)
GFR, Estimated: >60 mL/min (ref >60)
Glucose, Bld: 153 mg/dL — ABNORMAL HIGH (ref 70–99)
Potassium: 4 mmol/L (ref 3.5–5.1)
Sodium: 132 mmol/L — ABNORMAL LOW (ref 135–145)
Total Bilirubin: 0.4 mg/dL (ref 0.3–1.2)
Total Protein: 7.2 g/dL (ref 6.5–8.1)

## 2023-02-17 LAB — OCCULT BLOOD X 1 CARD TO LAB, STOOL: Fecal Occult Bld: NEGATIVE

## 2023-02-17 NOTE — ED Notes (Signed)
Pt and family updated about pending MD assessment, hemoccult card bedside for MD.
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
## 2023-02-17 NOTE — ED Notes (Signed)
Pt eating crackers and drinking water. Will attempt to get urine after pt eats.

## 2023-02-17 NOTE — Discharge Instructions (Signed)
Make an appointment to follow-up with your primary care doctor.  Return to the emergency room if you have any worsening symptoms.

## 2023-02-17 NOTE — ED Notes (Signed)
Pt ambulated indept to the bathroom for urine specimen.

## 2023-02-17 NOTE — ED Triage Notes (Signed)
Pt here for weakness and blood in stool that started this afternoon. Pt reports with exertion she has had some lightheadedness. Pt  denies hx of GI bleeds, pt takes 372m aspirin daily.

## 2023-02-18 DIAGNOSIS — G8929 Other chronic pain: Secondary | ICD-10-CM | POA: Diagnosis not present

## 2023-02-18 DIAGNOSIS — E7800 Pure hypercholesterolemia, unspecified: Secondary | ICD-10-CM | POA: Diagnosis not present

## 2023-02-18 DIAGNOSIS — K219 Gastro-esophageal reflux disease without esophagitis: Secondary | ICD-10-CM | POA: Diagnosis not present

## 2023-02-18 DIAGNOSIS — E1169 Type 2 diabetes mellitus with other specified complication: Secondary | ICD-10-CM | POA: Diagnosis not present

## 2023-02-18 DIAGNOSIS — I10 Essential (primary) hypertension: Secondary | ICD-10-CM | POA: Diagnosis not present

## 2023-02-18 DIAGNOSIS — E039 Hypothyroidism, unspecified: Secondary | ICD-10-CM | POA: Diagnosis not present

## 2023-02-19 ENCOUNTER — Telehealth: Payer: Self-pay | Admitting: Anesthesiology

## 2023-02-19 DIAGNOSIS — G44209 Tension-type headache, unspecified, not intractable: Secondary | ICD-10-CM

## 2023-02-19 DIAGNOSIS — G44309 Post-traumatic headache, unspecified, not intractable: Secondary | ICD-10-CM

## 2023-02-19 MED ORDER — GABAPENTIN 100 MG PO CAPS: ORAL_CAPSULE | ORAL | 6 refills | Status: DC

## 2023-02-19 NOTE — Telephone Encounter (Signed)
As long as she is not having side effects (too much drowsiness), there is room to increase medication. Ok to take 142m TID. [facility]

## 2023-02-19 NOTE — Telephone Encounter (Signed)
Pt's husband left message stating pt continues having headaches every day and the Neurontin 100 mg bid is not helping her at all. States can the dosage be increased and can she take tid instead. Requests call back.

## 2023-02-19 NOTE — Telephone Encounter (Signed)
Patient advised and thanked us for calling.  ?

## 2023-02-21 ENCOUNTER — Telehealth: Payer: Self-pay

## 2023-02-21 DIAGNOSIS — I10 Essential (primary) hypertension: Secondary | ICD-10-CM | POA: Diagnosis not present

## 2023-02-21 DIAGNOSIS — E119 Type 2 diabetes mellitus without complications: Secondary | ICD-10-CM | POA: Diagnosis not present

## 2023-02-21 DIAGNOSIS — R197 Diarrhea, unspecified: Secondary | ICD-10-CM | POA: Diagnosis not present

## 2023-02-21 DIAGNOSIS — K921 Melena: Secondary | ICD-10-CM | POA: Diagnosis not present

## 2023-02-21 DIAGNOSIS — E1169 Type 2 diabetes mellitus with other specified complication: Secondary | ICD-10-CM | POA: Diagnosis not present

## 2023-02-21 DIAGNOSIS — E039 Hypothyroidism, unspecified: Secondary | ICD-10-CM | POA: Diagnosis not present

## 2023-02-21 NOTE — Telephone Encounter (Signed)
Dose was just increased 2 days ago, they have to give the higher dose some time to get into her system.

## 2023-02-21 NOTE — Telephone Encounter (Signed)
Patient advised to give medication time to work.

## 2023-02-21 NOTE — Telephone Encounter (Signed)
Patient's husband called in requesting to speak with Andrea Heath, [name] has been having intermittent headaches for frequently and is wanting to know what they can do.

## 2023-02-21 NOTE — Telephone Encounter (Signed)
Anyone can return this call. Thanks. I will try in a bit

## 2023-02-24 ENCOUNTER — Telehealth: Payer: Self-pay | Admitting: Physician Assistant
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
## 2023-02-25 NOTE — Telephone Encounter (Signed)
Patient is taken Gabapentin 100 tid, please advise on recommendations, thank you

## 2023-02-25 NOTE — Telephone Encounter (Signed)
Patient scheduled for 2/28 with me to discuss, thanks

## 2023-02-26 ENCOUNTER — Encounter: Payer: Self-pay | Admitting: Neurology

## 2023-02-26 ENCOUNTER — Ambulatory Visit: Payer: Medicare HMO | Admitting: Neurology

## 2023-02-26 ENCOUNTER — Other Ambulatory Visit (INDEPENDENT_AMBULATORY_CARE_PROVIDER_SITE_OTHER): Payer: Medicare HMO

## 2023-02-26 VITALS — BP 145/82 | HR 77 | Ht 67.0 in | Wt 160.0 lb

## 2023-02-26 DIAGNOSIS — R519 Headache, unspecified: Secondary | ICD-10-CM | POA: Diagnosis not present

## 2023-02-26 DIAGNOSIS — G44309 Post-traumatic headache, unspecified, not intractable: Secondary | ICD-10-CM

## 2023-02-26 LAB — SEDIMENTATION RATE: Sed Rate: 22 mm/hr (ref 0–30)

## 2023-02-26 MED ORDER — GABAPENTIN 100 MG PO CAPS: ORAL_CAPSULE | ORAL | 6 refills | Status: DC

## 2023-02-26 NOTE — Progress Notes (Signed)
Medication Samples have been provided to the patient.  Drug name: nurtec       Strength: 75 mg        Qty: 1 pack  LOT: [id number]  Exp.Date: 02/2025  Dosing instructions: take at onset of head ache only take one in 24 hours   The patient has been instructed regarding the correct time, dose, and frequency of taking this medication, including desired effects and most common side effects.

## 2023-02-26 NOTE — Patient Instructions (Addendum)
Good to see you.  Bloodwork for ESR  2. Take the Gabapentin '100mg'$ : take 1 capsule in AM, 2 capsules in PM for 1 week. See how you are feeling, if still a lot of headaches, increase to 2 capsules twice a day.  3. Try the Nurtec at the onset of headache. Let me know if helpful and we will send refills. If not helpful, we can try a different medication to take as needed  4. Follow-up as scheduled with Sharene Butters in July, call for any changes
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
## 2023-02-27 ENCOUNTER — Telehealth: Payer: Self-pay

## 2023-02-27 ENCOUNTER — Other Ambulatory Visit: Payer: Self-pay

## 2023-02-27 MED ORDER — RIVASTIGMINE TARTRATE 1.5 MG PO CAPS: ORAL_CAPSULE | ORAL | 0 refills | Status: DC

## 2023-02-27 NOTE — Telephone Encounter (Signed)
MEDICATION: rivastigmine (EXELON) 1.5 MG capsule   PHARMACY:  [id number] - [place], [place] [address] (Ph: [phone])  Comments: Patient's husband is calling in states they have been waiting for this   **Let patient know to contact pharmacy at the end of the day to make sure medication is ready. **  ** Please notify patient to allow 48-72 hours to process**  **Encourage patient to contact the pharmacy for refills or they can request refills through [facility]**

## 2023-02-28 ENCOUNTER — Telehealth: Payer: Self-pay

## 2023-02-28 NOTE — Telephone Encounter (Signed)
Pt called informed that bloodwork normal, no inflammation seen.

## 2023-02-28 NOTE — Telephone Encounter (Signed)
-----   Message from Cameron Sprang, MD sent at 02/27/2023 12:15 PM EST ----- Pls let husband know bloodwork normal, no inflammation seen. Thanks

## 2023-03-07 ENCOUNTER — Other Ambulatory Visit: Payer: Self-pay

## 2023-03-07 MED ORDER — RIVASTIGMINE TARTRATE 1.5 MG PO CAPS: ORAL_CAPSULE | ORAL | 1 refills | Status: DC

## 2023-03-11 NOTE — Telephone Encounter (Signed)
Prescription was sent in on 3/8.

## 2023-04-10 DIAGNOSIS — H524 Presbyopia: Secondary | ICD-10-CM | POA: Diagnosis not present

## 2023-04-10 DIAGNOSIS — H35033 Hypertensive retinopathy, bilateral: Secondary | ICD-10-CM | POA: Diagnosis not present

## 2023-04-10 DIAGNOSIS — H35371 Puckering of macula, right eye: Secondary | ICD-10-CM | POA: Diagnosis not present

## 2023-04-10 DIAGNOSIS — E119 Type 2 diabetes mellitus without complications: Secondary | ICD-10-CM | POA: Diagnosis not present

## 2023-04-23 DIAGNOSIS — R040 Epistaxis: Secondary | ICD-10-CM | POA: Diagnosis not present

## 2023-04-23 DIAGNOSIS — J309 Allergic rhinitis, unspecified: Secondary | ICD-10-CM | POA: Diagnosis not present

## 2023-05-12 ENCOUNTER — Telehealth: Payer: Self-pay | Admitting: Rheumatology

## 2023-05-12 ENCOUNTER — Telehealth: Payer: Self-pay

## 2023-05-12 DIAGNOSIS — M349 Systemic sclerosis, unspecified: Secondary | ICD-10-CM

## 2023-05-12 DIAGNOSIS — R1310 Dysphagia, unspecified: Secondary | ICD-10-CM

## 2023-05-12 NOTE — Telephone Encounter (Signed)
Patient's daughter Lynnell Dike called stating that Andrea Heath is having difficulty swallowing solid foods and vomited yesterday as a result. Patients daughter believes she does okay with liquids and has not noticed patient having any difficulty breathing. [name] does not know the onset of the problem as patient has alzheimer's and is unable to tell her how long it has been going on. Patient's daughter would like to know if the scleroderma could be affecting her swallowing and if patient should have a swallow study. Please advise.     Per patient's daughter, both Jackelin and her husband are having memory issues. Will call patient and her spouse to obtain verbal consent to call [name] back in regards to this. [name] states that she will come with her mother for the next appointment and update DPR accordingly.

## 2023-05-12 NOTE — Telephone Encounter (Signed)
Copied from telephone call encounter from today at 2:10pm:  Patient called stating her daughter is very worried about her "blackish" stool. Patient states she went to the ER about a month ago and the tests came back negative. Patient states she did not call her PCP and hasn't changed her diet or medication.    Urgent referral has been placed to GI for difficulty swallowing and patient's daughter is aware.

## 2023-05-12 NOTE — Telephone Encounter (Signed)
Patient should be evaluated by gastroenterology.  Please make an urgent referral to gastroenterology.  In the meantime patient can see her PCP.

## 2023-05-12 NOTE — Telephone Encounter (Signed)
Patient called stating her daughter is very worried about her "blackish" stool.  Patient states she went to the ER about a month ago and the tests came back negative.  Patient states she did not call her PCP and hasn't changed her diet or medication.

## 2023-05-12 NOTE — Telephone Encounter (Signed)
Please review

## 2023-05-12 NOTE — Telephone Encounter (Signed)
Constant ring, no voicemail

## 2023-05-12 NOTE — Telephone Encounter (Signed)
Added this message to previous telephone call encounter from today.

## 2023-05-12 NOTE — Telephone Encounter (Signed)
I called patient and her husband and received verbal consent to speak with [name] about this concern.  I returned the call to [facility] and advised that per Dr. Corliss Skains,  Patient should be evaluated by gastroenterology.  We have placed an urgent referral to gastroenterology.  In the meantime patient can see her PCP.  [name] verbalized understanding.    Referral has been placed urgently.

## 2023-05-13 ENCOUNTER — Telehealth: Payer: Self-pay | Admitting: Rheumatology

## 2023-05-13 DIAGNOSIS — Z6828 Body mass index (BMI) 28.0-28.9, adult: Secondary | ICD-10-CM | POA: Diagnosis not present

## 2023-05-13 DIAGNOSIS — K219 Gastro-esophageal reflux disease without esophagitis: Secondary | ICD-10-CM | POA: Diagnosis not present

## 2023-05-13 DIAGNOSIS — D509 Iron deficiency anemia, unspecified: Secondary | ICD-10-CM | POA: Diagnosis not present

## 2023-05-13 DIAGNOSIS — Z86010 Personal history of colonic polyps: Secondary | ICD-10-CM | POA: Diagnosis not present

## 2023-05-13 DIAGNOSIS — K921 Melena: Secondary | ICD-10-CM | POA: Diagnosis not present

## 2023-05-13 NOTE — Telephone Encounter (Signed)
Patient called requesting we only speak with her or her husband regarding her medical care.  Patient states her daughter has a good heart, but has no right to tell her what to do.

## 2023-05-14 ENCOUNTER — Other Ambulatory Visit: Payer: Self-pay | Admitting: Gastroenterology

## 2023-05-14 DIAGNOSIS — R112 Nausea with vomiting, unspecified: Secondary | ICD-10-CM
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
## 2023-06-04 ENCOUNTER — Other Ambulatory Visit: Payer: Self-pay | Admitting: Rheumatology

## 2023-06-04 DIAGNOSIS — M1A0720 Idiopathic chronic gout, left ankle and foot, without tophus (tophi): Secondary | ICD-10-CM

## 2023-06-04 NOTE — Telephone Encounter (Signed)
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    Okay to refill Colchicine?

## 2023-06-05 ENCOUNTER — Ambulatory Visit: Payer: Medicare HMO | Admitting: Rheumatology

## 2023-06-05 DIAGNOSIS — M349 Systemic sclerosis, unspecified: Secondary | ICD-10-CM

## 2023-06-05 DIAGNOSIS — L821 Other seborrheic keratosis: Secondary | ICD-10-CM

## 2023-06-05 DIAGNOSIS — M810 Age-related osteoporosis without current pathological fracture: Secondary | ICD-10-CM

## 2023-06-05 DIAGNOSIS — M1A0720 Idiopathic chronic gout, left ankle and foot, without tophus (tophi): Secondary | ICD-10-CM

## 2023-06-05 DIAGNOSIS — Z8639 Personal history of other endocrine, nutritional and metabolic disease: Secondary | ICD-10-CM

## 2023-06-05 DIAGNOSIS — M7061 Trochanteric bursitis, right hip: Secondary | ICD-10-CM

## 2023-06-05 DIAGNOSIS — I7300 Raynaud's syndrome without gangrene: Secondary | ICD-10-CM

## 2023-06-05 DIAGNOSIS — R5383 Other fatigue: Secondary | ICD-10-CM

## 2023-06-05 DIAGNOSIS — E790 Hyperuricemia without signs of inflammatory arthritis and tophaceous disease: Secondary | ICD-10-CM

## 2023-06-05 DIAGNOSIS — M4004 Postural kyphosis, thoracic region: Secondary | ICD-10-CM

## 2023-06-05 DIAGNOSIS — M79642 Pain in left hand: Secondary | ICD-10-CM

## 2023-06-05 DIAGNOSIS — Z8679 Personal history of other diseases of the circulatory system: Secondary | ICD-10-CM

## 2023-06-10 ENCOUNTER — Other Ambulatory Visit: Payer: Self-pay | Admitting: Physician Assistant

## 2023-06-11 DIAGNOSIS — K2210 Ulcer of esophagus without bleeding: Secondary | ICD-10-CM | POA: Diagnosis not present

## 2023-06-11 DIAGNOSIS — K2950 Unspecified chronic gastritis without bleeding: Secondary | ICD-10-CM | POA: Diagnosis not present

## 2023-06-11 DIAGNOSIS — R1310 Dysphagia, unspecified: Secondary | ICD-10-CM | POA: Diagnosis not present

## 2023-06-11 DIAGNOSIS — K449 Diaphragmatic hernia without obstruction or gangrene: Secondary | ICD-10-CM | POA: Diagnosis not present

## 2023-06-11 DIAGNOSIS — K2930 Chronic superficial gastritis without bleeding: Secondary | ICD-10-CM | POA: Diagnosis not present

## 2023-06-11 DIAGNOSIS — K222 Esophageal obstruction: Secondary | ICD-10-CM | POA: Diagnosis not present

## 2023-06-13 DIAGNOSIS — K2930 Chronic superficial gastritis without bleeding: Secondary | ICD-10-CM | POA: Diagnosis not present

## 2023-06-17 ENCOUNTER — Ambulatory Visit
Admission: RE | Admit: 2023-06-17 | Discharge: 2023-06-17 | Disposition: A | Discharge status: Home / Self Care | Payer: Medicare HMO | Source: Ambulatory Visit | Attending: Gastroenterology | Admitting: Gastroenterology

## 2023-06-17 DIAGNOSIS — R112 Nausea with vomiting, unspecified: Secondary | ICD-10-CM

## 2023-06-26 ENCOUNTER — Ambulatory Visit (INDEPENDENT_AMBULATORY_CARE_PROVIDER_SITE_OTHER): Payer: Medicare HMO

## 2023-06-26 ENCOUNTER — Telehealth: Payer: Self-pay | Admitting: Physician Assistant

## 2023-06-26 DIAGNOSIS — R519 Headache, unspecified: Secondary | ICD-10-CM

## 2023-06-26 MED ORDER — KETOROLAC TROMETHAMINE 60 MG/2ML IM SOLN
60.0000 mg | Freq: Once | INTRAMUSCULAR | Status: AC
  Administered 2023-06-26: 60 mg via INTRAMUSCULAR

## 2023-06-26 NOTE — Telephone Encounter (Signed)
Pts spouse is calling in stating that the pt is having real bad headaches and the gabapentin is not helping.  Spouse would like to have a call back.

## 2023-06-26 NOTE — Telephone Encounter (Signed)
Spoke to patients husband patient having headaches every day she is taking gabapentin 2 capsules twice and tylenol 2 pills 4 times a day Patient wakes up ans when she gets up her stomach is upset like she is going to vomit from the headaches

## 2023-06-26 NOTE — Telephone Encounter (Signed)
Patient is coming in for headache cocktail around 2-2:30

## 2023-06-26 NOTE — Telephone Encounter (Signed)
Pls let husband know that stomach upset may be due to too much Tylenol, she should only be taking Tylenol 2-3 times a week. If they can come to the office, we can give her a migraine cocktail injection to hopefully break this headache cycle, but would not take too much Tylenol.

## 2023-06-27 ENCOUNTER — Telehealth: Payer: Self-pay

## 2023-06-27 NOTE — Telephone Encounter (Signed)
I contacted patient she is doing great today after the Toradol Injection 60mg  given IM yesterday. Today, no complaint of headache, no nausea nor vomiting. Feeeling much better, thanked me for calling.

## 2023-07-02 ENCOUNTER — Other Ambulatory Visit: Payer: Self-pay | Admitting: Physician Assistant

## 2023-07-10 ENCOUNTER — Encounter: Payer: Self-pay | Admitting: Physician Assistant

## 2023-07-10 ENCOUNTER — Ambulatory Visit: Payer: Medicare HMO | Admitting: Physician Assistant

## 2023-07-10 VITALS — BP 139/80 | HR 90 | Resp 20 | Ht 67.0 in | Wt 163.0 lb

## 2023-07-10 DIAGNOSIS — F0280 Dementia in other diseases classified elsewhere without behavioral disturbance: Secondary | ICD-10-CM

## 2023-07-10 DIAGNOSIS — G309 Alzheimer's disease, unspecified: Secondary | ICD-10-CM | POA: Diagnosis not present

## 2023-07-10 NOTE — Patient Instructions (Addendum)
It was a pleasure to see you today at our office.   Recommendations:  Follow up in  6 months Continue rivastigmine 1.5 mg twice a day  Continue gabapentin  and the other headache medicine , follow up with Dr. Karel Jarvis in August   Follow anxiety with primary doctor      Whom to call:  Memory  decline, memory medications: Call our office [phone]   For psychiatric meds, mood meds: Please have your primary care physician manage these medications.      For assessment of decision of mental capacity and competency:  Call Dr. Erick Blinks, geriatric psychiatrist at [phone]  For guidance in geriatric dementia issues please call [facility] [phone]    If you have any severe symptoms of a stroke, or other severe issues such as confusion,severe chills or fever, etc call 911 or go to the ER as you may need to be evaluated further   Feel free to visit Facebook page " [email or web address]" for tips of how to care for people with memory problems.       RECOMMENDATIONS FOR ALL PATIENTS WITH MEMORY PROBLEMS: 1. Continue to exercise (Recommend 30 minutes of walking everyday, or 3 hours every week) 2. Increase social interactions - continue going to [place] and enjoy social gatherings with friends and family 3. Eat healthy, avoid fried foods and eat more fruits and vegetables 4. Maintain adequate blood pressure, blood sugar, and blood cholesterol level. Reducing the risk of stroke and cardiovascular disease also helps promoting better memory. 5. Avoid stressful situations. Live a simple life and avoid aggravations. Organize your time and prepare for the next day in anticipation. 6. Sleep well, avoid any interruptions of sleep and avoid any distractions in the bedroom that may interfere with adequate sleep quality 7. Avoid sugar, avoid sweets as there is a strong link between excessive sugar intake, diabetes, and cognitive impairment We discussed the Mediterranean diet, which has been  shown to help patients reduce the risk of progressive memory disorders and reduces cardiovascular risk. This includes eating fish, eat fruits and green leafy vegetables, nuts like almonds and hazelnuts, walnuts, and also use olive oil. Avoid fast foods and fried foods as much as possible. Avoid sweets and sugar as sugar use has been linked to worsening of memory function.  There is always a concern of gradual progression of memory problems. If this is the case, then we may need to adjust level of care according to patient needs. Support, both to the patient and caregiver, should then be put into place.    The [employer] is here all day, every day for people facing Alzheimer's disease through our free 24/7 Helpline: [phone]. The Helpline provides reliable information and support to all those who need assistance, such as individuals living with memory loss, Alzheimer's or other dementia, caregivers, health care professionals and the public.  Our highly trained and knowledgeable staff can help you with: Understanding memory loss, dementia and Alzheimer's  Medications and other treatment options  General information about aging and brain health  Skills to provide quality care and to find the best care from professionals  Legal, financial and living-arrangement decisions Our Helpline also features: Confidential care consultation provided by master's level clinicians who can help with decision-making support, crisis assistance and education on issues families face every day  Help in a caller's preferred language using our translation service that features more than 200 languages and dialects  Referrals to local community programs, services and ongoing  support     FALL PRECAUTIONS: Be cautious when walking. Scan the area for obstacles that may increase the risk of trips and falls. When getting up in the mornings, sit up at the edge of the bed for a few minutes before getting out of  bed. Consider elevating the bed at the head end to avoid drop of blood pressure when getting up. Walk always in a well-lit room (use night lights in the walls). Avoid area rugs or power cords from appliances in the middle of the walkways. Use a walker or a cane if necessary and consider physical therapy for balance exercise. Get your eyesight checked regularly.  FINANCIAL OVERSIGHT: Supervision, especially oversight when making financial decisions or transactions is also recommended.  HOME SAFETY: Consider the safety of the kitchen when operating appliances like stoves, microwave oven, and blender. Consider having supervision and share cooking responsibilities until no longer able to participate in those. Accidents with firearms and other hazards in the house should be identified and addressed as well.   ABILITY TO BE LEFT ALONE: If patient is unable to contact 911 operator, consider using LifeLine, or when the need is there, arrange for someone to stay with patients. Smoking is a fire hazard, consider supervision or cessation. Risk of wandering should be assessed by caregiver and if detected at any point, supervision and safe proof recommendations should be instituted.  MEDICATION SUPERVISION: Inability to self-administer medication needs to be constantly addressed. Implement a mechanism to ensure safe administration of the medications.   DRIVING: Regarding driving, in patients with progressive memory problems, driving will be impaired. We advise to have someone else do the driving if trouble finding directions or if minor accidents are reported. Independent driving assessment is available to determine safety of driving.     Mediterranean Diet A Mediterranean diet refers to food and lifestyle choices that are based on the traditions of countries located on the [employer]. This way of eating has been shown to help prevent certain conditions and improve outcomes for people who have chronic  diseases, like kidney disease and heart disease. What are tips for following this plan? Lifestyle  Cook and eat meals together with your family, when possible. Drink enough fluid to keep your urine clear or pale yellow. Be physically active every day. This includes: Aerobic exercise like running or swimming. Leisure activities like gardening, walking, or housework. Get 7-8 hours of sleep each night. If recommended by your health care provider, drink red wine in moderation. This means 1 glass a day for nonpregnant women and 2 glasses a day for men. A glass of wine equals 5 oz (150 mL). Reading food labels  Check the serving size of packaged foods. For foods such as rice and pasta, the serving size refers to the amount of cooked product, not dry. Check the total fat in packaged foods. Avoid foods that have saturated fat or trans fats. Check the ingredients list for added sugars, such as corn syrup. Shopping  At the grocery store, buy most of your food from the areas near the walls of the store. This includes: Fresh fruits and vegetables (produce). Grains, beans, nuts, and seeds. Some of these may be available in unpackaged forms or large amounts (in bulk). Fresh seafood. Poultry and eggs. Low-fat dairy products. Buy whole ingredients instead of prepackaged foods. Buy fresh fruits and vegetables in-season from local farmers markets. Buy frozen fruits and vegetables in resealable bags. If you do not have access to quality fresh seafood,  buy precooked frozen shrimp or canned fish, such as tuna, salmon, or sardines. Buy small amounts of raw or cooked vegetables, salads, or olives from the deli or salad bar at your store. Stock your pantry so you always have certain foods on hand, such as olive oil, canned tuna, canned tomatoes, rice, pasta, and beans. Cooking  Cook foods with extra-virgin olive oil instead of using butter or other vegetable oils. Have meat as a side dish, and have vegetables  or grains as your main dish. This means having meat in small portions or adding small amounts of meat to foods like pasta or stew. Use beans or vegetables instead of meat in common dishes like chili or lasagna. Experiment with different cooking methods. Try roasting or broiling vegetables instead of steaming or sauteing them. Add frozen vegetables to soups, stews, pasta, or rice. Add nuts or seeds for added healthy fat at each meal. You can add these to yogurt, salads, or vegetable dishes. Marinate fish or vegetables using olive oil, lemon juice, garlic, and fresh herbs. Meal planning  Plan to eat 1 vegetarian meal one day each week. Try to work up to 2 vegetarian meals, if possible. Eat seafood 2 or more times a week. Have healthy snacks readily available, such as: Vegetable sticks with hummus. Greek yogurt. Fruit and nut trail mix. Eat balanced meals throughout the week. This includes: Fruit: 2-3 servings a day Vegetables: 4-5 servings a day Low-fat dairy: 2 servings a day Fish, poultry, or lean meat: 1 serving a day Beans and legumes: 2 or more servings a week Nuts and seeds: 1-2 servings a day Whole grains: 6-8 servings a day Extra-virgin olive oil: 3-4 servings a day Limit red meat and sweets to only a few servings a month What are my food choices? Mediterranean diet Recommended Grains: Whole-grain pasta. Brown rice. Bulgar wheat. Polenta. Couscous. Whole-wheat bread. Orpah Cobb. Vegetables: Artichokes. Beets. Broccoli. Cabbage. Carrots. Eggplant. Green beans. Chard. Kale. Spinach. Onions. Leeks. Peas. Squash. Tomatoes. Peppers. Radishes. Fruits: Apples. Apricots. Avocado. Berries. Bananas. Cherries. Dates. Figs. Grapes. Lemons. Melon. Oranges. Peaches. Plums. Pomegranate. Meats and other protein foods: Beans. Almonds. Sunflower seeds. Pine nuts. Peanuts. Cod. Salmon. Scallops. Shrimp. Tuna. Tilapia. Clams. Oysters. Eggs. Dairy: Low-fat milk. Cheese. Greek yogurt. Beverages:  Water. Red wine. Herbal tea. Fats and oils: Extra virgin olive oil. Avocado oil. Grape seed oil. Sweets and desserts: [place] yogurt with honey. Baked apples. Poached pears. Trail mix. Seasoning and other foods: Basil. Cilantro. Coriander. Cumin. Mint. Parsley. Sage. Rosemary. Tarragon. Garlic. Oregano. Thyme. Pepper. Balsalmic vinegar. Tahini. Hummus. Tomato sauce. Olives. Mushrooms. Limit these Grains: Prepackaged pasta or rice dishes. Prepackaged cereal with added sugar. Vegetables: Deep fried potatoes (french fries). Fruits: Fruit canned in syrup. Meats and other protein foods: Beef. Pork. Lamb. Poultry with skin. Hot dogs. Tomasa Blase. Dairy: Ice cream. Sour cream. Whole milk. Beverages: Juice. Sugar-sweetened soft drinks. Beer. Liquor and spirits. Fats and oils: Butter. Canola oil. Vegetable oil. Beef fat (tallow). Lard. Sweets and desserts: Cookies. Cakes. Pies. Candy. Seasoning and other foods: Mayonnaise. Premade sauces and marinades. The items listed may not be a complete list. Talk with your dietitian about what dietary choices are right for you. Summary The Mediterranean diet includes both food and lifestyle choices. Eat a variety of fresh fruits and vegetables, beans, nuts, seeds, and whole grains. Limit the amount of red meat and sweets that you eat. Talk with your health care provider about whether it is safe for you to drink red wine in moderation.  This means 1 glass a day for nonpregnant women and 2 glasses a day for men. A glass of wine equals 5 oz (150 mL). This information is not intended to replace advice given to you by your health care provider. Make sure you discuss any questions you have with your health care provider. Document Released: 08/08/2016 Document Revised: 09/10/2016 Document Reviewed: 08/08/2016 Elsevier Interactive Patient Education  2017 [employer].
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
## 2023-07-11 ENCOUNTER — Telehealth: Payer: Self-pay | Admitting: Physician Assistant

## 2023-07-11 NOTE — Telephone Encounter (Signed)
Pt husband left message on the VM needing to speak with Neysa Bonito about medication RX that was given when she came in to the appt

## 2023-07-11 NOTE — Telephone Encounter (Signed)
LM on vm to call the office back.

## 2023-07-14 ENCOUNTER — Other Ambulatory Visit: Payer: Self-pay

## 2023-07-14 MED ORDER — RIVASTIGMINE TARTRATE 1.5 MG PO CAPS: ORAL_CAPSULE | ORAL | 3 refills | Status: DC

## 2023-07-15 ENCOUNTER — Other Ambulatory Visit: Payer: Self-pay | Admitting: Physician Assistant

## 2023-07-15 DIAGNOSIS — M1A0720 Idiopathic chronic gout, left ankle and foot, without tophus (tophi): Secondary | ICD-10-CM

## 2023-07-21 ENCOUNTER — Other Ambulatory Visit: Payer: Self-pay | Admitting: *Deleted

## 2023-07-21 DIAGNOSIS — M1A0720 Idiopathic chronic gout, left ankle and foot, without tophus (tophi): Secondary | ICD-10-CM

## 2023-07-21 MED ORDER — COLCHICINE 0.6 MG PO TABS: 0.6000 mg | ORAL_TABLET | Freq: Every day | ORAL | 2 refills | Status: DC | PRN

## 2023-07-21 NOTE — Telephone Encounter (Signed)
Patient's husband contacted the pharmacy about patient's Colchicine. He states that patient needs a refill. Advised him that patient received a refill on 06/04/2023 for a 90 day supply. Patient has contacted [facility] who advised patient's husband they dispensed 90 tablets to them. He states they do not have any other bottles of Colchicine at the house and that Daielle has been taking her medications as prescribe. I advised patient's husband I would forward request to Dr. Corliss Skains. I advised him that the insurance may not cover refill as they just filled it last month. I advised him that they could use a good rx coupon to receive the medication if insurance will not cover it. Advised him that it should be about $24.99. Also advised we could change the prescription to 30 day supply with 2 refills to avoid this issue in the future.   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    Okay to refill Colchicine?
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
## 2023-08-05 ENCOUNTER — Ambulatory Visit: Payer: Medicare HMO | Admitting: Rheumatology

## 2023-08-05 ENCOUNTER — Other Ambulatory Visit: Payer: Self-pay | Admitting: Physician Assistant

## 2023-08-05 DIAGNOSIS — M7061 Trochanteric bursitis, right hip: Secondary | ICD-10-CM

## 2023-08-05 DIAGNOSIS — I7300 Raynaud's syndrome without gangrene: Secondary | ICD-10-CM

## 2023-08-05 DIAGNOSIS — Z8639 Personal history of other endocrine, nutritional and metabolic disease: Secondary | ICD-10-CM

## 2023-08-05 DIAGNOSIS — M810 Age-related osteoporosis without current pathological fracture: Secondary | ICD-10-CM

## 2023-08-05 DIAGNOSIS — M1A0720 Idiopathic chronic gout, left ankle and foot, without tophus (tophi): Secondary | ICD-10-CM

## 2023-08-05 DIAGNOSIS — M79642 Pain in left hand: Secondary | ICD-10-CM

## 2023-08-05 DIAGNOSIS — L821 Other seborrheic keratosis: Secondary | ICD-10-CM

## 2023-08-05 DIAGNOSIS — E790 Hyperuricemia without signs of inflammatory arthritis and tophaceous disease: Secondary | ICD-10-CM

## 2023-08-05 DIAGNOSIS — Z8679 Personal history of other diseases of the circulatory system: Secondary | ICD-10-CM

## 2023-08-05 DIAGNOSIS — M4004 Postural kyphosis, thoracic region: Secondary | ICD-10-CM

## 2023-08-05 DIAGNOSIS — M349 Systemic sclerosis, unspecified: Secondary | ICD-10-CM

## 2023-08-05 DIAGNOSIS — R5383 Other fatigue: Secondary | ICD-10-CM
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
## 2023-08-22 DIAGNOSIS — Z9181 History of falling: Secondary | ICD-10-CM | POA: Diagnosis not present

## 2023-08-22 DIAGNOSIS — M25512 Pain in left shoulder: Secondary | ICD-10-CM | POA: Diagnosis not present

## 2023-08-22 DIAGNOSIS — M19012 Primary osteoarthritis, left shoulder: Secondary | ICD-10-CM | POA: Diagnosis not present

## 2023-08-28 ENCOUNTER — Encounter: Payer: Self-pay | Admitting: Rheumatology

## 2023-08-28 ENCOUNTER — Ambulatory Visit: Payer: Medicare HMO | Attending: Rheumatology | Admitting: Rheumatology

## 2023-08-28 VITALS — BP 176/73 | HR 76 | Resp 13 | Ht 67.0 in | Wt 161.8 lb

## 2023-08-28 DIAGNOSIS — L821 Other seborrheic keratosis: Secondary | ICD-10-CM

## 2023-08-28 DIAGNOSIS — M7061 Trochanteric bursitis, right hip: Secondary | ICD-10-CM

## 2023-08-28 DIAGNOSIS — M4004 Postural kyphosis, thoracic region: Secondary | ICD-10-CM | POA: Diagnosis not present

## 2023-08-28 DIAGNOSIS — I7300 Raynaud's syndrome without gangrene: Secondary | ICD-10-CM

## 2023-08-28 DIAGNOSIS — M349 Systemic sclerosis, unspecified: Secondary | ICD-10-CM

## 2023-08-28 DIAGNOSIS — M1A0720 Idiopathic chronic gout, left ankle and foot, without tophus (tophi): Secondary | ICD-10-CM | POA: Diagnosis not present

## 2023-08-28 DIAGNOSIS — M79642 Pain in left hand: Secondary | ICD-10-CM

## 2023-08-28 DIAGNOSIS — R5383 Other fatigue: Secondary | ICD-10-CM

## 2023-08-28 DIAGNOSIS — Z8679 Personal history of other diseases of the circulatory system: Secondary | ICD-10-CM

## 2023-08-28 DIAGNOSIS — Z8639 Personal history of other endocrine, nutritional and metabolic disease: Secondary | ICD-10-CM | POA: Diagnosis not present

## 2023-08-28 DIAGNOSIS — E790 Hyperuricemia without signs of inflammatory arthritis and tophaceous disease: Secondary | ICD-10-CM

## 2023-08-28 DIAGNOSIS — M810 Age-related osteoporosis without current pathological fracture: Secondary | ICD-10-CM | POA: Diagnosis not present

## 2023-08-28 NOTE — Patient Instructions (Addendum)
Please schedule a follow-up appointment with Dr. Isaiah Serge (pulmonologist)  Please discuss DEXA scan with your PCP.  Your last  DEXA scan was in 2019.  Your blood pressure was elevated at your visit today. Please check your blood pressure at home for the next week. Check it in the morning, at lunchtime and dinnertime. Keep a record of these readings. Reach out to your PCP to share your blood pressure readings and for follow up.

## 2023-08-29 LAB — CBC WITH DIFFERENTIAL/PLATELET
Absolute Monocytes: 396 {cells}/uL (ref 200–950)
Basophils Absolute: 9 {cells}/uL (ref 0–200)
Basophils Relative: 0.2 %
Eosinophils Absolute: 39 {cells}/uL (ref 15–500)
Eosinophils Relative: 0.9 %
HCT: 37.5 % (ref 35.0–45.0)
Hemoglobin: 12.9 g/dL (ref 11.7–15.5)
Lymphs Abs: 1152 {cells}/uL (ref 850–3900)
MCH: 32.3 pg (ref 27.0–33.0)
MCHC: 34.4 g/dL (ref 32.0–36.0)
MCV: 93.8 fL (ref 80.0–100.0)
MPV: 11.6 fL (ref 7.5–12.5)
Monocytes Relative: 9.2 %
Neutro Abs: 2705 {cells}/uL (ref 1500–7800)
Neutrophils Relative %: 62.9 %
Platelets: 226 10*3/uL (ref 140–400)
RBC: 4 10*6/uL (ref 3.80–5.10)
RDW: 12.7 % (ref 11.0–15.0)
Total Lymphocyte: 26.8 %
WBC: 4.3 10*3/uL (ref 3.8–10.8)

## 2023-08-29 LAB — COMPLETE METABOLIC PANEL WITH GFR
AG Ratio: 1.4 (calc) (ref 1.0–2.5)
ALT: 19 U/L (ref 6–29)
AST: 19 U/L (ref 10–35)
Albumin: 4 g/dL (ref 3.6–5.1)
Alkaline phosphatase (APISO): 80 U/L (ref 37–153)
BUN: 15 mg/dL (ref 7–25)
CO2: 23 mmol/L (ref 20–32)
Calcium: 9.1 mg/dL (ref 8.6–10.4)
Chloride: 99 mmol/L (ref 98–110)
Creat: 0.95 mg/dL (ref 0.60–1.00)
Globulin: 2.8 g/dL (ref 1.9–3.7)
Glucose, Bld: 231 mg/dL — ABNORMAL HIGH (ref 65–99)
Potassium: 4.6 mmol/L (ref 3.5–5.3)
Sodium: 131 mmol/L — ABNORMAL LOW (ref 135–146)
Total Bilirubin: 1.1 mg/dL (ref 0.2–1.2)
Total Protein: 6.8 g/dL (ref 6.1–8.1)
eGFR: 61 mL/min/{1.73_m2} (ref >=60)

## 2023-08-29 LAB — HEMOGLOBIN A1C
Hgb A1c MFr Bld: 6.6 %{Hb} — ABNORMAL HIGH (ref <5.7)
Mean Plasma Glucose: 143 mg/dL
eAG (mmol/L): 7.9 mmol/L

## 2023-08-29 LAB — URINALYSIS, ROUTINE W REFLEX MICROSCOPIC
Bacteria, UA: NONE SEEN /HPF
Bilirubin Urine: NEGATIVE
Glucose, UA: NEGATIVE
Hgb urine dipstick: NEGATIVE
Hyaline Cast: NONE SEEN /LPF
Ketones, ur: NEGATIVE
Nitrite: NEGATIVE
Protein, ur: NEGATIVE
RBC / HPF: NONE SEEN /HPF (ref 0–2)
Specific Gravity, Urine: 1.014 (ref 1.001–1.035)
pH: 5.5 (ref 5.0–8.0)

## 2023-08-29 LAB — MICROSCOPIC MESSAGE

## 2023-08-29 LAB — URIC ACID: Uric Acid, Serum: 4.6 mg/dL (ref 2.5–7.0)

## 2023-08-29 NOTE — Progress Notes (Signed)
Glucose is elevated.  Sodium is low and stable.  UA negative, hemoglobin A1c elevated at 6.6 CBC normal, uric acid 4.6 which is in the desirable range.

## 2023-08-29 NOTE — Progress Notes (Signed)
Please forward results to her PCP.

## 2023-09-30 ENCOUNTER — Ambulatory Visit (INDEPENDENT_AMBULATORY_CARE_PROVIDER_SITE_OTHER): Payer: Medicare HMO | Admitting: Neurology

## 2023-09-30 ENCOUNTER — Encounter: Payer: Self-pay | Admitting: Neurology

## 2023-09-30 VITALS — BP 165/85 | HR 88 | Ht 67.0 in | Wt 165.2 lb

## 2023-09-30 DIAGNOSIS — G44309 Post-traumatic headache, unspecified, not intractable: Secondary | ICD-10-CM

## 2023-09-30 DIAGNOSIS — G43909 Migraine, unspecified, not intractable, without status migrainosus: Secondary | ICD-10-CM

## 2023-09-30 MED ORDER — GABAPENTIN 100 MG PO CAPS: ORAL_CAPSULE | ORAL | 3 refills | Status: DC
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
## 2023-09-30 NOTE — Patient Instructions (Addendum)
Good to see you doing well.  Continue Gabapentin 100mg : take 1 capsule in AM, 1 capsule in PM  2. If you have another attack of worsening headaches, please call our office and we will increase Gabapentin dose. We can also do a shot to help if the headaches last for 3 days or more.  3. Follow-up with Marlowe Kays, PA-C as scheduled on January 12, 2024 at 3pm. I will see you in July 2025. Call for any changes.

## 2023-10-02 ENCOUNTER — Other Ambulatory Visit: Payer: Self-pay | Admitting: Rheumatology

## 2023-10-02 DIAGNOSIS — M1A0720 Idiopathic chronic gout, left ankle and foot, without tophus (tophi): Secondary | ICD-10-CM

## 2023-10-02 NOTE — Telephone Encounter (Signed)
Last Fill: 07/21/2023  Labs: 08/28/2023 Glucose is elevated.  Sodium is low and stable.  UA negative, hemoglobin A1c elevated at 6.6 CBC normal, uric acid 4.6 which is in the desirable range.   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

## 2023-10-16 ENCOUNTER — Telehealth: Payer: Self-pay | Admitting: Physician Assistant

## 2023-10-16 NOTE — Telephone Encounter (Signed)
Patients husband called, he would like to talk with Andrea Heath about Andrea Heath's HA. She keeps having them, he would like to know if there is something she can take, or maybe change her meds.

## 2023-10-16 NOTE — Telephone Encounter (Signed)
This is Aquino patient for headaches, we see for memory. Will call later in clinic

## 2023-10-16 NOTE — Telephone Encounter (Signed)
Patient is currently taken Gapabentin  100mg  2tabs in n am one at hs, headaches every other day. Headaches are coming back, tylenol in between.please advise.

## 2023-10-16 NOTE — Telephone Encounter (Signed)
On her last visit, we discussed that if headaches recur and last for several days, we can do a migraine cocktail for them to come in to the office, or we can do a 1-week course of steroid to break the headache. Which would they prefer?

## 2023-10-16 NOTE — Telephone Encounter (Signed)
No answer at 1:46 10/16/2023 when he calls back, please ask how much gapabentin she is taken and any otc to route to Dr.Aquino

## 2023-10-17 ENCOUNTER — Other Ambulatory Visit: Payer: Self-pay

## 2023-10-17 MED ORDER — PREDNISONE 10 MG (21) PO TBPK: ORAL_TABLET | ORAL | 0 refills | Status: DC

## 2023-10-17 NOTE — Telephone Encounter (Signed)
Thanks for calling and sending Rx! Pls close/sign encounter when done, thanks

## 2023-10-17 NOTE — Telephone Encounter (Signed)
Hey he and she requested prednisone taper, sent to [facility]. Thanks, please let me know if I need to do anything else. They thanked me for calling.

## 2023-10-20 ENCOUNTER — Telehealth: Payer: Self-pay | Admitting: *Deleted

## 2023-10-20 NOTE — Telephone Encounter (Signed)
Patient's husband called the office stating his attorney needs records for a particular date patient was seen in the office. Advised patient's husband multiple times he will need to contact medical records to request those records or have his attorney contact medical records to request the records.

## 2023-10-22 ENCOUNTER — Telehealth: Payer: Self-pay | Admitting: Neurology

## 2023-10-22 NOTE — Telephone Encounter (Signed)
Patient is having migraines and nose bleeds and is requesting call back

## 2023-10-22 NOTE — Telephone Encounter (Signed)
I called patient, nose bleed early. FYI, I advised to contact PCP, if urgent to go to Urgent Care

## 2023-10-23 DIAGNOSIS — Z6828 Body mass index (BMI) 28.0-28.9, adult: Secondary | ICD-10-CM | POA: Diagnosis not present

## 2023-10-23 DIAGNOSIS — R040 Epistaxis: Secondary | ICD-10-CM | POA: Diagnosis not present

## 2023-10-23 DIAGNOSIS — R519 Headache, unspecified: Secondary | ICD-10-CM | POA: Diagnosis not present

## 2023-10-23 NOTE — Telephone Encounter (Signed)
Agree with PCP for nosebleeds. Headaches may be sinus-related if she is having nose bleeds. Thanks

## 2023-12-25 ENCOUNTER — Other Ambulatory Visit: Payer: Self-pay | Admitting: Physician Assistant

## 2024-01-01 ENCOUNTER — Telehealth: Payer: Self-pay | Admitting: Rheumatology

## 2024-01-01 NOTE — Telephone Encounter (Signed)
 I returned patient's call and discussed that she can increase colchicine  0.6 mg tablet 1 p.o. twice daily for 3 days and once the symptoms resolved then she can reduce the dose to once a day.  Patient voiced understanding.  I would try to avoid steroid use because she is diabetic and she has scleroderma

## 2024-01-01 NOTE — Telephone Encounter (Signed)
 Attempted to contact the patient and left message for patient to call the office.

## 2024-01-01 NOTE — Telephone Encounter (Signed)
 Patient has had gout flare x 1 week, pain, tenderness, swelling, colchicine is not helping much, bil feet, difficulty walking, [facility], [place].

## 2024-01-01 NOTE — Telephone Encounter (Signed)
 Patient's husband called stating Andrea Heath is experiencing a gout flair in her right foot.  It is so painful she is having difficulty walking.  Patient is currently taking Colchicine , but it doesn't seem to be helping.  Patient is asking if there is anything better that can help with the pain.

## 2024-01-12 ENCOUNTER — Ambulatory Visit (INDEPENDENT_AMBULATORY_CARE_PROVIDER_SITE_OTHER): Payer: HMO | Admitting: Physician Assistant

## 2024-01-12 ENCOUNTER — Encounter: Payer: Self-pay | Admitting: Physician Assistant

## 2024-01-12 ENCOUNTER — Telehealth: Payer: Self-pay | Admitting: Neurology

## 2024-01-12 VITALS — BP 123/65 | Resp 18 | Wt 158.0 lb

## 2024-01-12 DIAGNOSIS — G309 Alzheimer's disease, unspecified: Secondary | ICD-10-CM | POA: Diagnosis not present

## 2024-01-12 DIAGNOSIS — G44309 Post-traumatic headache, unspecified, not intractable: Secondary | ICD-10-CM

## 2024-01-12 DIAGNOSIS — F0280 Dementia in other diseases classified elsewhere without behavioral disturbance: Secondary | ICD-10-CM | POA: Diagnosis not present

## 2024-01-12 MED ORDER — RIVASTIGMINE TARTRATE 3 MG PO CAPS: ORAL_CAPSULE | ORAL | 11 refills | Status: DC

## 2024-01-12 NOTE — Patient Instructions (Addendum)
 It was a pleasure to see you today at our office.   Recommendations:  Follow up in  6 months Continue rivastigmine  3 mg twice a day  Continue gabapentin   and the other headache medicine , follow up with Dr. Georjean   Follow anxiety with primary doctor      Whom to call:  Memory  decline, memory medications: Call our office [phone]   For psychiatric meds, mood meds: Please have your primary care physician manage these medications.      For assessment of decision of mental capacity and competency:  Call Dr. Rosaline Nine, geriatric psychiatrist at [phone]  For guidance in geriatric dementia issues please call [facility] [phone]    If you have any severe symptoms of a stroke, or other severe issues such as confusion,severe chills or fever, etc call 911 or go to the ER as you may need to be evaluated further   Feel free to visit Facebook page  [employer] for tips of how to care for people with memory problems.       RECOMMENDATIONS FOR ALL PATIENTS WITH MEMORY PROBLEMS: 1. Continue to exercise (Recommend 30 minutes of walking everyday, or 3 hours every week) 2. Increase social interactions - continue going to [place] and enjoy social gatherings with friends and family 3. Eat healthy, avoid fried foods and eat more fruits and vegetables 4. Maintain adequate blood pressure, blood sugar, and blood cholesterol level. Reducing the risk of stroke and cardiovascular disease also helps promoting better memory. 5. Avoid stressful situations. Live a simple life and avoid aggravations. Organize your time and prepare for the next day in anticipation. 6. Sleep well, avoid any interruptions of sleep and avoid any distractions in the bedroom that may interfere with adequate sleep quality 7. Avoid sugar, avoid sweets as there is a strong link between excessive sugar intake, diabetes, and cognitive impairment We discussed the Mediterranean diet, which has been shown to help  patients reduce the risk of progressive memory disorders and reduces cardiovascular risk. This includes eating fish, eat fruits and green leafy vegetables, nuts like almonds and hazelnuts, walnuts, and also use olive oil. Avoid fast foods and fried foods as much as possible. Avoid sweets and sugar as sugar use has been linked to worsening of memory function.  There is always a concern of gradual progression of memory problems. If this is the case, then we may need to adjust level of care according to patient needs. Support, both to the patient and caregiver, should then be put into place.    The [employer] is here all day, every day for people facing Alzheimer's disease through our free 24/7 Helpline: [phone]. The Helpline provides reliable information and support to all those who need assistance, such as individuals living with memory loss, Alzheimer's or other dementia, caregivers, health care professionals and the public.  Our highly trained and knowledgeable staff can help you with: Understanding memory loss, dementia and Alzheimer's  Medications and other treatment options  General information about aging and brain health  Skills to provide quality care and to find the best care from professionals  Legal, financial and living-arrangement decisions Our Helpline also features: Confidential care consultation provided by master's level clinicians who can help with decision-making support, crisis assistance and education on issues families face every day  Help in a caller's preferred language using our translation service that features more than 200 languages and dialects  Referrals to local community programs, services and ongoing support  FALL PRECAUTIONS: Be cautious when walking. Scan the area for obstacles that may increase the risk of trips and falls. When getting up in the mornings, sit up at the edge of the bed for a few minutes before getting out of bed. Consider  elevating the bed at the head end to avoid drop of blood pressure when getting up. Walk always in a well-lit room (use night lights in the walls). Avoid area rugs or power cords from appliances in the middle of the walkways. Use a walker or a cane if necessary and consider physical therapy for balance exercise. Get your eyesight checked regularly.  FINANCIAL OVERSIGHT: Supervision, especially oversight when making financial decisions or transactions is also recommended.  HOME SAFETY: Consider the safety of the kitchen when operating appliances like stoves, microwave oven, and blender. Consider having supervision and share cooking responsibilities until no longer able to participate in those. Accidents with firearms and other hazards in the house should be identified and addressed as well.   ABILITY TO BE LEFT ALONE: If patient is unable to contact 911 operator, consider using LifeLine, or when the need is there, arrange for someone to stay with patients. Smoking is a fire hazard, consider supervision or cessation. Risk of wandering should be assessed by caregiver and if detected at any point, supervision and safe proof recommendations should be instituted.  MEDICATION SUPERVISION: Inability to self-administer medication needs to be constantly addressed. Implement a mechanism to ensure safe administration of the medications.   DRIVING: Regarding driving, in patients with progressive memory problems, driving will be impaired. We advise to have someone else do the driving if trouble finding directions or if minor accidents are reported. Independent driving assessment is available to determine safety of driving.     Mediterranean Diet A Mediterranean diet refers to food and lifestyle choices that are based on the traditions of countries located on the [employer]. This way of eating has been shown to help prevent certain conditions and improve outcomes for people who have chronic diseases, like  kidney disease and heart disease. What are tips for following this plan? Lifestyle  Cook and eat meals together with your family, when possible. Drink enough fluid to keep your urine clear or pale yellow. Be physically active every day. This includes: Aerobic exercise like running or swimming. Leisure activities like gardening, walking, or housework. Get 7-8 hours of sleep each night. If recommended by your health care provider, drink red wine in moderation. This means 1 glass a day for nonpregnant women and 2 glasses a day for men. A glass of wine equals 5 oz (150 mL). Reading food labels  Check the serving size of packaged foods. For foods such as rice and pasta, the serving size refers to the amount of cooked product, not dry. Check the total fat in packaged foods. Avoid foods that have saturated fat or trans fats. Check the ingredients list for added sugars, such as corn syrup. Shopping  At the grocery store, buy most of your food from the areas near the walls of the store. This includes: Fresh fruits and vegetables (produce). Grains, beans, nuts, and seeds. Some of these may be available in unpackaged forms or large amounts (in bulk). Fresh seafood. Poultry and eggs. Low-fat dairy products. Buy whole ingredients instead of prepackaged foods. Buy fresh fruits and vegetables in-season from local farmers markets. Buy frozen fruits and vegetables in resealable bags. If you do not have access to quality fresh seafood, buy precooked frozen shrimp or  canned fish, such as tuna, salmon, or sardines. Buy small amounts of raw or cooked vegetables, salads, or olives from the deli or salad bar at your store. Stock your pantry so you always have certain foods on hand, such as olive oil, canned tuna, canned tomatoes, rice, pasta, and beans. Cooking  Cook foods with extra-virgin olive oil instead of using butter or other vegetable oils. Have meat as a side dish, and have vegetables or grains as  your main dish. This means having meat in small portions or adding small amounts of meat to foods like pasta or stew. Use beans or vegetables instead of meat in common dishes like chili or lasagna. Experiment with different cooking methods. Try roasting or broiling vegetables instead of steaming or sauteing them. Add frozen vegetables to soups, stews, pasta, or rice. Add nuts or seeds for added healthy fat at each meal. You can add these to yogurt, salads, or vegetable dishes. Marinate fish or vegetables using olive oil, lemon juice, garlic, and fresh herbs. Meal planning  Plan to eat 1 vegetarian meal one day each week. Try to work up to 2 vegetarian meals, if possible. Eat seafood 2 or more times a week. Have healthy snacks readily available, such as: Vegetable sticks with hummus. Greek yogurt. Fruit and nut trail mix. Eat balanced meals throughout the week. This includes: Fruit: 2-3 servings a day Vegetables: 4-5 servings a day Low-fat dairy: 2 servings a day Fish, poultry, or lean meat: 1 serving a day Beans and legumes: 2 or more servings a week Nuts and seeds: 1-2 servings a day Whole grains: 6-8 servings a day Extra-virgin olive oil: 3-4 servings a day Limit red meat and sweets to only a few servings a month What are my food choices? Mediterranean diet Recommended Grains: Whole-grain pasta. Brown rice. Bulgar wheat. Polenta. Couscous. Whole-wheat bread. [place]. Vegetables: Artichokes. Beets. Broccoli. Cabbage. Carrots. Eggplant. Green beans. Chard. Kale. Spinach. Onions. Leeks. Peas. Squash. Tomatoes. Peppers. Radishes. Fruits: Apples. Apricots. Avocado. Berries. Bananas. Cherries. Dates. Figs. Grapes. Lemons. Melon. Oranges. Peaches. Plums. Pomegranate. Meats and other protein foods: Beans. Almonds. Sunflower seeds. Pine nuts. Peanuts. Cod. Salmon. Scallops. Shrimp. Tuna. Tilapia. Clams. Oysters. Eggs. Dairy: Low-fat milk. Cheese. Greek yogurt. Beverages: Water. Red  wine. Herbal tea. Fats and oils: Extra virgin olive oil. Avocado oil. Grape seed oil. Sweets and desserts: Greek yogurt with honey. Baked apples. Poached pears. Trail mix. Seasoning and other foods: Basil. Cilantro. Coriander. Cumin. Mint. Parsley. Sage. Rosemary. Tarragon. Garlic. Oregano. Thyme. Pepper. Balsalmic vinegar. Tahini. Hummus. Tomato sauce. Olives. Mushrooms. Limit these Grains: Prepackaged pasta or rice dishes. Prepackaged cereal with added sugar. Vegetables: Deep fried potatoes (french fries). Fruits: Fruit canned in syrup. Meats and other protein foods: Beef. Pork. Lamb. Poultry with skin. Hot dogs. [place]. Dairy: Ice cream. Sour cream. Whole milk. Beverages: Juice. Sugar-sweetened soft drinks. Beer. Liquor and spirits. Fats and oils: Butter. Canola oil. Vegetable oil. Beef fat (tallow). Lard. Sweets and desserts: Cookies. Cakes. Pies. Candy. Seasoning and other foods: Mayonnaise. Premade sauces and marinades. The items listed may not be a complete list. Talk with your dietitian about what dietary choices are right for you. Summary The Mediterranean diet includes both food and lifestyle choices. Eat a variety of fresh fruits and vegetables, beans, nuts, seeds, and whole grains. Limit the amount of red meat and sweets that you eat. Talk with your health care provider about whether it is safe for you to drink red wine in moderation. This means 1 glass a  day for nonpregnant women and 2 glasses a day for men. A glass of wine equals 5 oz (150 mL). This information is not intended to replace advice given to you by your health care provider. Make sure you discuss any questions you have with your health care provider. Document Released: 08/08/2016 Document Revised: 09/10/2016 Document Reviewed: 08/08/2016 Elsevier Interactive Patient Education  2017 [employer].
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
## 2024-01-12 NOTE — Telephone Encounter (Signed)
 Patient had an appt with sara today. When they was checking out, they asked for a sooner appt with aquino due to her headaches. I put her on the cancellation list but her husband insisted she needs to be seen sooner than that.

## 2024-01-15 MED ORDER — GABAPENTIN 100 MG PO CAPS: ORAL_CAPSULE | ORAL | 3 refills | Status: DC

## 2024-01-15 NOTE — Telephone Encounter (Signed)
She has migraines and they can come and go like this. In the past, we would increase the gabapentin and the headaches go down. She is on a low dose Gabapentin 100mg  BID. We were on 200mg  BID previously, please go back up (pls send in updated Rx for 2 tabs BID). Also, she will need to start cutting down again on the Tylenol and Advil to 3 times a week, otherwise headaches will not get better because of rebound headaches from taking over the counter medication daily. Thanks

## 2024-01-15 NOTE — Telephone Encounter (Signed)
Pt called no answer left a voice mail to call the office back  °

## 2024-01-15 NOTE — Telephone Encounter (Signed)
How frequent or the headaches (on average, how many days a week/month are they occurring)?  Months  How long do the headaches last?  Its been going on for a long time Verify what preventative medication and dose you are taking (e.g. topiramate, propranolol, amitriptyline, Emgality, etc)  gabapentin Verify which rescue medication you are taking (triptan, Advil, Excedrin, Aleve, Ubrelvy, etc)  tylenol and advil  How often are you taking pain relievers/analgesics/rescue mediction?  Taken tylenol and Advil in the morning and at night  along with the gabapentin   Pt husband said gabapentin is not working asking what can they do,

## 2024-01-15 NOTE — Telephone Encounter (Signed)
Pt's husband called in wanting to see if there is something else the pt can take other than gabapentin or advil? They are not touching the pt's headache.

## 2024-01-15 NOTE — Telephone Encounter (Signed)
Gabapentin has been sent in

## 2024-01-16 NOTE — Telephone Encounter (Signed)
Pt called an informed [name] stated that She has migraines and they can come and go like this. In the past, we would increase the gabapentin and the headaches go down. She is on a low dose Gabapentin 100mg  BID. We were on 200mg  BID previously, please go back up  Also, she will need to start cutting down again on the Tylenol and Advil to 3 times a week, otherwise headaches will not get better because of rebound headaches from taking over the counter medication daily. New RX was sent in 01/15/24

## 2024-01-30 ENCOUNTER — Ambulatory Visit (INDEPENDENT_AMBULATORY_CARE_PROVIDER_SITE_OTHER): Payer: HMO | Admitting: Neurology

## 2024-01-30 ENCOUNTER — Encounter: Payer: Self-pay | Admitting: Neurology

## 2024-01-30 VITALS — BP 153/70 | HR 77 | Ht 67.0 in | Wt 161.8 lb

## 2024-01-30 DIAGNOSIS — G309 Alzheimer's disease, unspecified: Secondary | ICD-10-CM

## 2024-01-30 DIAGNOSIS — G43909 Migraine, unspecified, not intractable, without status migrainosus: Secondary | ICD-10-CM | POA: Diagnosis not present

## 2024-01-30 DIAGNOSIS — F0280 Dementia in other diseases classified elsewhere without behavioral disturbance: Secondary | ICD-10-CM | POA: Diagnosis not present

## 2024-01-30 NOTE — Patient Instructions (Addendum)
Good to see you.  Continue Gabapentin 100mg : Take 2 capsules twice a day  2. Keep a calendar of your headaches  3. Samples for Ubrelvy and Nurtec are provided, let us know which is more helpful and we will send in refills  4. Referral will be sent for home OT and speech therapy  5. Follow-up in 4 months, call for any changes
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
## 2024-01-30 NOTE — Progress Notes (Unsigned)
Samples of this drug were given to the patient, quantity [id number], Lot Number 1/26 Ubrelvy  Samples of this drug were given to the patient, quantity [id number], Lot Number 6/28 Nurtec

## 2024-02-03 ENCOUNTER — Other Ambulatory Visit: Payer: Self-pay

## 2024-02-09 ENCOUNTER — Telehealth: Payer: Self-pay | Admitting: Neurology

## 2024-02-09 ENCOUNTER — Other Ambulatory Visit: Payer: Self-pay | Admitting: Rheumatology

## 2024-02-09 DIAGNOSIS — M1A0720 Idiopathic chronic gout, left ankle and foot, without tophus (tophi): Secondary | ICD-10-CM

## 2024-02-09 NOTE — Telephone Encounter (Signed)
 Pts husband called to let Dr.aquino know no one has contacted them about getting help/ aid? Out to their home to help Andrea Heath. He would like to talk with someone about this

## 2024-02-09 NOTE — Telephone Encounter (Signed)
 Daughter would like call back from nurse about referral for home health orders. Stated father will not remember.

## 2024-02-09 NOTE — Telephone Encounter (Signed)
 Last Fill: 10/02/2023  Labs: 08/28/2023 Glucose is elevated.  Sodium is low and stable.  UA negative, hemoglobin A1c elevated at 6.6 CBC normal, uric acid 4.6 which is in the desirable range.   Next Visit: 02/26/2024  Last Visit: 08/28/2023  DX:  Idiopathic chronic gout of left foot without tophus   Current Dose per office note 08/28/2023:  colchicine  0.6 mg daily as needed   Patient to update labs at upcoming appointment on 02/26/2024  Okay to refill Colchicine ?

## 2024-02-10 NOTE — Telephone Encounter (Signed)
Husband called again regarding his wifes referral. He said no one has called him back. Would like a call to discuss what's going on

## 2024-02-10 NOTE — Telephone Encounter (Signed)
I've reached out to Andrea Heath will call pt when I hear back

## 2024-02-10 NOTE — Telephone Encounter (Signed)
Pt husband called informed that we are waiting to hear from home health to see if they are accepting her or not

## 2024-02-11 NOTE — Telephone Encounter (Signed)
[facility] can help-- Monday 02/16/24 pt called an informed
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
## 2024-02-16 DIAGNOSIS — H3530 Unspecified macular degeneration: Secondary | ICD-10-CM | POA: Diagnosis not present

## 2024-02-16 DIAGNOSIS — M1A0790 Idiopathic chronic gout, unspecified ankle and foot, without tophus (tophi): Secondary | ICD-10-CM | POA: Diagnosis not present

## 2024-02-16 DIAGNOSIS — I7300 Raynaud's syndrome without gangrene: Secondary | ICD-10-CM | POA: Diagnosis not present

## 2024-02-16 DIAGNOSIS — J309 Allergic rhinitis, unspecified: Secondary | ICD-10-CM | POA: Diagnosis not present

## 2024-02-16 DIAGNOSIS — I10 Essential (primary) hypertension: Secondary | ICD-10-CM | POA: Diagnosis not present

## 2024-02-16 DIAGNOSIS — F329 Major depressive disorder, single episode, unspecified: Secondary | ICD-10-CM | POA: Diagnosis not present

## 2024-02-16 DIAGNOSIS — G8929 Other chronic pain: Secondary | ICD-10-CM | POA: Diagnosis not present

## 2024-02-16 DIAGNOSIS — E875 Hyperkalemia: Secondary | ICD-10-CM | POA: Diagnosis not present

## 2024-02-16 DIAGNOSIS — M810 Age-related osteoporosis without current pathological fracture: Secondary | ICD-10-CM | POA: Diagnosis not present

## 2024-02-16 DIAGNOSIS — G309 Alzheimer's disease, unspecified: Secondary | ICD-10-CM | POA: Diagnosis not present

## 2024-02-16 DIAGNOSIS — F02A3 Dementia in other diseases classified elsewhere, mild, with mood disturbance: Secondary | ICD-10-CM | POA: Diagnosis not present

## 2024-02-16 DIAGNOSIS — G43909 Migraine, unspecified, not intractable, without status migrainosus: Secondary | ICD-10-CM | POA: Diagnosis not present

## 2024-02-16 DIAGNOSIS — M349 Systemic sclerosis, unspecified: Secondary | ICD-10-CM | POA: Diagnosis not present

## 2024-02-16 DIAGNOSIS — E119 Type 2 diabetes mellitus without complications: Secondary | ICD-10-CM | POA: Diagnosis not present

## 2024-02-16 DIAGNOSIS — M2010 Hallux valgus (acquired), unspecified foot: Secondary | ICD-10-CM | POA: Diagnosis not present

## 2024-02-16 DIAGNOSIS — M21612 Bunion of left foot: Secondary | ICD-10-CM | POA: Diagnosis not present

## 2024-02-16 DIAGNOSIS — M4000 Postural kyphosis, site unspecified: Secondary | ICD-10-CM | POA: Diagnosis not present

## 2024-02-16 DIAGNOSIS — J329 Chronic sinusitis, unspecified: Secondary | ICD-10-CM | POA: Diagnosis not present

## 2024-02-16 DIAGNOSIS — M1811 Unilateral primary osteoarthritis of first carpometacarpal joint, right hand: Secondary | ICD-10-CM | POA: Diagnosis not present

## 2024-02-16 DIAGNOSIS — E039 Hypothyroidism, unspecified: Secondary | ICD-10-CM | POA: Diagnosis not present

## 2024-02-16 DIAGNOSIS — F411 Generalized anxiety disorder: Secondary | ICD-10-CM | POA: Diagnosis not present

## 2024-02-16 DIAGNOSIS — L821 Other seborrheic keratosis: Secondary | ICD-10-CM | POA: Diagnosis not present

## 2024-02-16 DIAGNOSIS — F02A4 Dementia in other diseases classified elsewhere, mild, with anxiety: Secondary | ICD-10-CM | POA: Diagnosis not present

## 2024-02-16 DIAGNOSIS — M79642 Pain in left hand: Secondary | ICD-10-CM | POA: Diagnosis not present

## 2024-02-18 ENCOUNTER — Telehealth: Payer: Self-pay

## 2024-02-18 NOTE — Telephone Encounter (Signed)
 Gina with [facility] called given verbal orders ST 1 time a week for 6 weeks for communication deficits

## 2024-02-19 ENCOUNTER — Other Ambulatory Visit: Payer: Self-pay | Admitting: Rheumatology

## 2024-02-19 NOTE — Telephone Encounter (Signed)
 Labs: 08/28/2023 Glucose is elevated.  Sodium is low and stable.  UA negative, hemoglobin A1c elevated at 6.6 CBC normal, uric acid 4.6 which is in the desirable range.   Next Visit: 02/26/2024  Last Visit: 08/28/2023  DX: Idiopathic chronic gout of left foot without tophus   Current Dose per office note 08/28/2023: allopurinol 300 mg   Okay to refill Allopurinol?

## 2024-02-26 ENCOUNTER — Ambulatory Visit: Payer: HMO | Attending: Rheumatology | Admitting: Rheumatology

## 2024-02-26 ENCOUNTER — Encounter: Payer: Self-pay | Admitting: Rheumatology

## 2024-02-26 VITALS — BP 183/74 | HR 82 | Resp 16 | Ht 67.0 in | Wt 158.0 lb

## 2024-02-26 DIAGNOSIS — M1A0720 Idiopathic chronic gout, left ankle and foot, without tophus (tophi): Secondary | ICD-10-CM

## 2024-02-26 DIAGNOSIS — Z8639 Personal history of other endocrine, nutritional and metabolic disease: Secondary | ICD-10-CM | POA: Diagnosis not present

## 2024-02-26 DIAGNOSIS — Z8679 Personal history of other diseases of the circulatory system: Secondary | ICD-10-CM

## 2024-02-26 DIAGNOSIS — M79642 Pain in left hand: Secondary | ICD-10-CM

## 2024-02-26 DIAGNOSIS — R5383 Other fatigue: Secondary | ICD-10-CM

## 2024-02-26 DIAGNOSIS — M810 Age-related osteoporosis without current pathological fracture: Secondary | ICD-10-CM | POA: Diagnosis not present

## 2024-02-26 DIAGNOSIS — E790 Hyperuricemia without signs of inflammatory arthritis and tophaceous disease: Secondary | ICD-10-CM

## 2024-02-26 DIAGNOSIS — I7300 Raynaud's syndrome without gangrene: Secondary | ICD-10-CM | POA: Diagnosis not present

## 2024-02-26 DIAGNOSIS — M349 Systemic sclerosis, unspecified: Secondary | ICD-10-CM

## 2024-02-26 DIAGNOSIS — L821 Other seborrheic keratosis: Secondary | ICD-10-CM

## 2024-02-26 DIAGNOSIS — M4004 Postural kyphosis, thoracic region: Secondary | ICD-10-CM

## 2024-02-26 LAB — CBC WITH DIFFERENTIAL/PLATELET
Absolute Lymphocytes: 1025 {cells}/uL (ref 850–3900)
Absolute Monocytes: 442 {cells}/uL (ref 200–950)
Basophils Absolute: 9 {cells}/uL (ref 0–200)
Basophils Relative: 0.2 %
Eosinophils Absolute: 19 {cells}/uL (ref 15–500)
Eosinophils Relative: 0.4 %
HCT: 38.7 % (ref 35.0–45.0)
Hemoglobin: 12.8 g/dL (ref 11.7–15.5)
MCH: 31.8 pg (ref 27.0–33.0)
MCHC: 33.1 g/dL (ref 32.0–36.0)
MCV: 96.3 fL (ref 80.0–100.0)
MPV: 12.4 fL (ref 7.5–12.5)
Monocytes Relative: 9.4 %
Neutro Abs: 3205 {cells}/uL (ref 1500–7800)
Neutrophils Relative %: 68.2 %
Platelets: 194 10*3/uL (ref 140–400)
RBC: 4.02 10*6/uL (ref 3.80–5.10)
RDW: 12.2 % (ref 11.0–15.0)
Total Lymphocyte: 21.8 %
WBC: 4.7 10*3/uL (ref 3.8–10.8)

## 2024-02-26 LAB — COMPLETE METABOLIC PANEL WITH GFR
AG Ratio: 1.9 (calc) (ref 1.0–2.5)
ALT: 18 U/L (ref 6–29)
AST: 18 U/L (ref 10–35)
Albumin: 4.4 g/dL (ref 3.6–5.1)
Alkaline phosphatase (APISO): 76 U/L (ref 37–153)
BUN: 15 mg/dL (ref 7–25)
CO2: 27 mmol/L (ref 20–32)
Calcium: 9.5 mg/dL (ref 8.6–10.4)
Chloride: 98 mmol/L (ref 98–110)
Creat: 0.85 mg/dL (ref 0.60–1.00)
Globulin: 2.3 g/dL (ref 1.9–3.7)
Glucose, Bld: 155 mg/dL — ABNORMAL HIGH (ref 65–99)
Potassium: 4.6 mmol/L (ref 3.5–5.3)
Sodium: 132 mmol/L — ABNORMAL LOW (ref 135–146)
Total Bilirubin: 0.6 mg/dL (ref 0.2–1.2)
Total Protein: 6.7 g/dL (ref 6.1–8.1)
eGFR: 70 mL/min/{1.73_m2} (ref >=60)

## 2024-02-26 LAB — URIC ACID: Uric Acid, Serum: 2.7 mg/dL (ref 2.5–7.0)

## 2024-02-26 NOTE — Patient Instructions (Addendum)
 Please schedule a follow-up appointment with Dr. Isaiah Serge (pulmonologist)  Please schedule a follow-up appointment with Dr. Tenny Craw (cardiologist)

## 2024-02-27 NOTE — Progress Notes (Signed)
 Glucose is elevated at 155 which is probably not fasting.  Sodium is low and stable.  CBC is normal.  Uric acid is within normal limits.

## 2024-03-01 ENCOUNTER — Telehealth: Payer: Self-pay

## 2024-03-01 NOTE — Telephone Encounter (Signed)
 LM for the pt to call back to make her an appt.

## 2024-03-01 NOTE — Telephone Encounter (Signed)
-----   Message from Dietrich Pates sent at 02/26/2024  7:20 PM EST ----- Pt needs to have follow up appt ----- Message ----- From: Henriette Combs, LPN Sent: 04/07/8118  11:04 AM EST To: Pricilla Riffle, MD  Good morning! Dr. Corliss Skains saw this mutual patient in our office this morning. She is due for a follow up visit in your office but forgets to call and make the appointment. Can you please have someone from your office reach out to the patient to schedule a follow up visit. Thanks!

## 2024-03-01 NOTE — Telephone Encounter (Signed)
 Need OV with DR Tenny Craw will call the pt.

## 2024-03-10 ENCOUNTER — Telehealth: Payer: Self-pay | Admitting: Neurology

## 2024-03-10 MED ORDER — NURTEC 75 MG PO TBDP: ORAL_TABLET | ORAL | 5 refills | Status: DC

## 2024-03-10 NOTE — Telephone Encounter (Signed)
 Pt. Husband cld as Pt. Is having increasingly painful migraines, would like to know if she can get prescribed something, she tried Nurtec and samples worked

## 2024-03-10 NOTE — Telephone Encounter (Signed)
 Rx sent for Nurtec. If we have insurance issues, ok to give samples. Thanks

## 2024-03-10 NOTE — Telephone Encounter (Signed)
 Pt called no answer left a voice mail the RX was sent in if any trouble getting RX call the office back will send message to AP team that PA is needed

## 2024-03-10 NOTE — Telephone Encounter (Signed)
 PA is needed for Nurtec

## 2024-03-12 NOTE — Telephone Encounter (Signed)
 Pt added to list of needing to be seen when DR Tenny Craw adds in or if an appt opens up.

## 2024-03-17 ENCOUNTER — Telehealth: Payer: Self-pay

## 2024-03-17 NOTE — Telephone Encounter (Signed)
-----   Message from [facility] sent at 03/15/2024  4:41 PM EDT ----- Yes. We will reach out to him for an appointment. Thanks ----- Message ----- From: Henriette Combs, LPN Sent: 1/61/0960  11:02 AM EDT To: Chilton Greathouse, MD  Good morning! Dr. Corliss Skains saw this mutual patient in the office today. Patient is due for a follow up visit in your office but forgets to call to make the appointment. Can you please have someone in your office reach out to the patient to schedule her an appointment. Thanks!

## 2024-03-17 NOTE — Telephone Encounter (Signed)
 Spoke to patient. She stated that she would call back to schedule appt once her husband is home, as he will have to drive her.

## 2024-03-21 ENCOUNTER — Other Ambulatory Visit: Payer: Self-pay | Admitting: Rheumatology

## 2024-03-21 DIAGNOSIS — M1A0720 Idiopathic chronic gout, left ankle and foot, without tophus (tophi): Secondary | ICD-10-CM

## 2024-03-22 NOTE — Telephone Encounter (Signed)
 Called and spoke to patient's spouse, Ron(DPR) and scheduled consult for 05/06/2024 at 8:30. Addressed provided.  Nothing further needed.

## 2024-03-25 ENCOUNTER — Other Ambulatory Visit (HOSPITAL_COMMUNITY): Payer: Self-pay

## 2024-03-25 ENCOUNTER — Telehealth: Payer: Self-pay

## 2024-03-25 NOTE — Telephone Encounter (Signed)
 PA request has been Submitted. New Encounter has been or will be created for follow up. For additional info see Pharmacy Prior Auth telephone encounter from 03-25-2024.

## 2024-03-25 NOTE — Telephone Encounter (Signed)
 Pharmacy Patient Advocate Encounter   Received notification from RX Request Messages that prior authorization for Nurtec 75MG  dispersible tablets is required/requested.   Insurance verification completed.   The patient is insured through [facility] ADVANTAGE/RX ADVANCE .   Per test claim: PA required; PA submitted to above mentioned insurance via CoverMyMeds Key/confirmation #/EOC [id number] Status is pending

## 2024-03-29 NOTE — Telephone Encounter (Signed)
 Pharmacy Patient Advocate Encounter  Received notification from [facility] ADVANTAGE/RX ADVANCE that Prior Authorization for  Nurtec 75MG  dispersible tablets has been DENIED.  Full denial letter will be uploaded to the media tab. See denial reason below.  Plan requirements for the approval of a non-formulary medication are:   Diagnosis of a medically-accepted indication, AND; The patient has tried and failed at least one formulary alternative,  OR;  The prescriber can submit a supporting statement to explain why the formulary drug(s) are not appropriate for the patient.  PA #/Case ID/Reference #: [id number]

## 2024-03-30 MED ORDER — RIZATRIPTAN BENZOATE 10 MG PO TBDP: ORAL_TABLET | ORAL | 11 refills | Status: DC

## 2024-03-30 NOTE — Telephone Encounter (Signed)
 I sent in Rx for Rizatriptan 10mg : take 1 tablet at onset of migraine. May take 2 hours after if needed. Do not take more than 3 a week. Side effects may include drowsiness, dizziness, weakness. Let us know if she has problems with medication, thanks

## 2024-03-30 NOTE — Telephone Encounter (Signed)
 Spoke with pt husband she has not tried  sumatriptan (Imitrex) or rizatriptan (Maxalt) pt husband is ok with her trying one, he was advised I would call him back to let him know which one we send in for her,

## 2024-03-30 NOTE — Addendum Note (Signed)
 Addended by: Van Clines on: 03/30/2024 01:25 PM   Modules accepted: Orders

## 2024-03-30 NOTE — Telephone Encounter (Signed)
Pt called no answer left a voice mail to call office back  °

## 2024-03-30 NOTE — Telephone Encounter (Signed)
 Pls check with her husband and let him know insurance would not approve Nurtec unless she has tried sumatriptan (Imitrex) or rizatriptan (Maxalt) in the past. Has she been on these medications? If yes, we can let insurance know so hopefully it can get approved. If not, we will need to try them first. Thanks

## 2024-03-31 NOTE — Telephone Encounter (Signed)
 Spoke with pt husband informed him that [name] sent Rx for Rizatriptan 10mg : take 1 tablet at onset of migraine. May take 2 hours after if needed. Do not take more than 3 a week. Side effects may include drowsiness, dizziness, weakness. Let us know if she has problems with medication. They are going to pick it up today and will let us know how she dose taken it,

## 2024-04-01 ENCOUNTER — Telehealth: Payer: Self-pay | Admitting: Neurology

## 2024-04-01 MED ORDER — RIZATRIPTAN BENZOATE 10 MG PO TBDP: ORAL_TABLET | ORAL | 11 refills | Status: DC

## 2024-04-01 NOTE — Telephone Encounter (Signed)
 RX was [facility] to [facility], [facility] was taken off of pt pharmacy list, will call [facility] at [phone] to cancel maxalt rx at 10 am

## 2024-04-01 NOTE — Telephone Encounter (Signed)
 Pt husband called informed that RX was sent to [facility], if pharmacy is different please call and let me know,

## 2024-04-01 NOTE — Telephone Encounter (Signed)
 Ron called and LM with AN, stated Andrea Heath called him to let him know the new RX for HA is at the pharmacy but it is not there. Would like a call back

## 2024-04-08 ENCOUNTER — Telehealth: Payer: Self-pay | Admitting: Neurology

## 2024-04-08 NOTE — Telephone Encounter (Signed)
 [facility] Nurse called to inform Dr. Karel Jarvis and to ask if the following supplements are ok for her to take: Beet Root Oil Gummies Fish Oil Vitamin B Vitamin D

## 2024-04-08 NOTE — Telephone Encounter (Signed)
That is fine, thanks 

## 2024-04-08 NOTE — Telephone Encounter (Signed)
 Andrea Heath called an was informed that [name] said that it was ok for her to take Beet Root Oil Gummies, Fish Oil, Vitamin B, and Vitamin D

## 2024-04-09 DIAGNOSIS — E039 Hypothyroidism, unspecified: Secondary | ICD-10-CM | POA: Diagnosis not present

## 2024-04-09 DIAGNOSIS — F0781 Postconcussional syndrome: Secondary | ICD-10-CM | POA: Diagnosis not present

## 2024-04-09 DIAGNOSIS — G309 Alzheimer's disease, unspecified: Secondary | ICD-10-CM | POA: Diagnosis not present

## 2024-04-09 DIAGNOSIS — I10 Essential (primary) hypertension: Secondary | ICD-10-CM | POA: Diagnosis not present

## 2024-04-09 DIAGNOSIS — E7800 Pure hypercholesterolemia, unspecified: Secondary | ICD-10-CM | POA: Diagnosis not present

## 2024-04-09 DIAGNOSIS — E1169 Type 2 diabetes mellitus with other specified complication: Secondary | ICD-10-CM | POA: Diagnosis not present

## 2024-04-14 ENCOUNTER — Telehealth: Payer: Self-pay | Admitting: Neurology

## 2024-04-14 ENCOUNTER — Telehealth: Payer: Self-pay | Admitting: Pharmacy Technician

## 2024-04-14 ENCOUNTER — Other Ambulatory Visit (HOSPITAL_COMMUNITY): Payer: Self-pay

## 2024-04-14 MED ORDER — NURTEC 75 MG PO TBDP: ORAL_TABLET | ORAL | 5 refills | Status: DC

## 2024-04-14 NOTE — Telephone Encounter (Signed)
 Andrea Heath called in stated that the riatriptan was not helping with this headache today he was going to give her another one in 2 hours, last weak headache was so bed she had a nose bleed, she has an appointment may 20th they are keeping up with her headaches they are daily almost

## 2024-04-14 NOTE — Telephone Encounter (Signed)
 Pharmacy Patient Advocate Encounter   Received notification from Pt Calls Messages that prior authorization for NURTEC 75MG  is required/requested.   Insurance verification completed.   The patient is insured through [facility] ADVANTAGE/RX ADVANCE .  Appeal has been submitted. Will advise when response is received or follow up in 1 week. Please be advised that most companies may take 30 days to make a decision.

## 2024-04-14 NOTE — Telephone Encounter (Signed)
Appeal has been submitted. Will advise when response is received or follow up in 1 week. Please be advised that most companies may take 30 days to make a decision.

## 2024-04-14 NOTE — Telephone Encounter (Signed)
 Pt husband Ron called an informed that now that she has tried and failed a formulary alternative, we will try to get Nurtec approved again Also pls make sure she is not taking anything over the counter such as Tylenol or Ibuprofen on a daily basis, otherwise headaches will be harder to treat,   Monchell she has tried Rizatriptan for episodic migraine for PA

## 2024-04-14 NOTE — Telephone Encounter (Signed)
 Pls let husband know that now that she has tried and failed a formulary alternative, we will try to get Nurtec approved again. Pls let Monchell know that she has tried Rizatriptan for episodic migraine for PA. Also pls make sure she is not taking anything over the counter such as Tylenol or Ibuprofen on a daily basis, otherwise headaches will be harder to treat, thanks

## 2024-04-14 NOTE — Telephone Encounter (Signed)
 Pt.s husband wants to speak to Pattie Borders about wife medication

## 2024-04-21 ENCOUNTER — Other Ambulatory Visit (HOSPITAL_COMMUNITY): Payer: Self-pay

## 2024-04-21 NOTE — Telephone Encounter (Signed)
 Pharmacy Patient Advocate Encounter  Received notification from [employer] ADVANTAGE/RX ADVANCE that Prior Authorization for NURTEC 75MG  has been APPROVED from 4.22.25 to 12.31.25. Ran test claim, Copay is $592.06. This test claim was processed through [facility]- copay amounts may vary at other pharmacies due to pharmacy/plan contracts, or as the patient moves through the different stages of their insurance plan.   PA #/Case ID/Reference #: [id number]

## 2024-04-28 ENCOUNTER — Telehealth: Payer: Self-pay | Admitting: Neurology

## 2024-04-28 NOTE — Telephone Encounter (Signed)
 Pt. Daughter,Would like to discuss med changes with nurse or doc

## 2024-04-29 NOTE — Telephone Encounter (Signed)
 Pt's husband called in stating the medication that had been sent in was going to be $400 a month and they cannot afford that. They want to see if there is a way to qualify for a discount for it? Or to try something else that will work that will be cheaper.

## 2024-04-29 NOTE — Telephone Encounter (Signed)
 The daughter doesn't want the medication they are trying to get approved. She wants to see if they can try the ones they know medicare will cover.

## 2024-04-29 NOTE — Telephone Encounter (Signed)
 Called pt and told him to call the number on the back of insurance under benefits and eligibility  and tell them you would like to enroll in the payment RX plan. He understood.

## 2024-04-30 ENCOUNTER — Telehealth: Payer: Self-pay

## 2024-04-30 MED ORDER — AIMOVIG 140 MG/ML ~~LOC~~ SOAJ: 1.0000 | SUBCUTANEOUS | 11 refills | Status: DC

## 2024-04-30 NOTE — Telephone Encounter (Signed)
 Patients daughter Babette Bolognese called and LM with AN. She has been waiting two days for a call to discuss things with Aquino/Heather. Her mother was prescribed a mediation and was not approved for medicare. She is taking gabapentin  and is taking so much she has diarrhea. Daughter wants a new medication for her mother.  The daughter wants a call back today at [phone]

## 2024-04-30 NOTE — Telephone Encounter (Signed)
 Called PT number and left message for husband to call office to see what is going on with Andrea Heath. Informed him that daughter called and left message that we are unable to talk with daughter due to her not being on DPR.

## 2024-04-30 NOTE — Telephone Encounter (Signed)
 Patients husband called back to speak with renee, she was unable to talk, he is waiting for a call back

## 2024-04-30 NOTE — Telephone Encounter (Signed)
 Patient daughter called to check on the status of her call  I informed her that we would not be able to speak with her as she was not on the [facility]. The patient would need to fill out a new DPR and add the patient daughter on the it if she agrees to allow us  to speak with her on the patient behalf

## 2024-04-30 NOTE — Telephone Encounter (Signed)
 Pls let him know that we will try one of the monthly injections. It is called Aimovig, side effects may include injection site reaction, constipation, but overall tolerated well. We can do the teaching for the first dose if they wish. Pls check what he meant that she is taking so much gabapentin , how much is she taking?

## 2024-04-30 NOTE — Addendum Note (Signed)
 Addended by: Jhonny Moss on: 04/30/2024 09:34 AM   Modules accepted: Orders

## 2024-05-03 ENCOUNTER — Telehealth: Payer: Self-pay

## 2024-05-03 NOTE — Telephone Encounter (Signed)
 Called pt and talked to husband and made him aware of Dr Aquino's order. Also that we would be able to show them how to inject the medicine. Make him aware that unfortunately we are unable to speak with daughter about Mrs. Ruelas medical issues due to her not being on DPR. If they would like for her to be on it they can come and fill out form. He understood and with change it when they return for her next visit. He was very understanding. This call was done on Friday May 2nd 2025.

## 2024-05-03 NOTE — Telephone Encounter (Signed)
 Called pt and talked to husband about Dr Ty Gales answer to the once a month injection. He understand and made aware that they could come and allow us  to show them how to use the injection. He understood.

## 2024-05-04 ENCOUNTER — Telehealth: Payer: Self-pay | Admitting: Pharmacy Technician

## 2024-05-04 ENCOUNTER — Other Ambulatory Visit (HOSPITAL_COMMUNITY): Payer: Self-pay

## 2024-05-04 NOTE — Telephone Encounter (Signed)
 Pharmacy Patient Advocate Encounter  Received notification from [facility] [employer] that Prior Authorization for AIMOVIG 140MG  has been APPROVED from 5.6.25 to 11.2.25. Ran test claim, Copay is $47. This test claim was processed through [facility]- copay amounts may vary at other pharmacies due to pharmacy/plan contracts, or as the patient moves through the different stages of their insurance plan.   PA #/Case ID/Reference #: [id number]

## 2024-05-04 NOTE — Telephone Encounter (Signed)
 Pharmacy Patient Advocate Encounter   Received notification from Pt Calls Messages that prior authorization for AIMOVIG 140MG  is required/requested.   Insurance verification completed.   The patient is insured through [facility] ADVANTAGE/RX ADVANCE .   Per test claim: PA required; PA submitted to above mentioned insurance via CoverMyMeds Key/confirmation #/EOC [id number] Status is pending

## 2024-05-04 NOTE — Telephone Encounter (Signed)
 Called and left message that Aimovig was approved.

## 2024-05-04 NOTE — Telephone Encounter (Signed)
 PA has been submitted, and telephone encounter has been created. Please see telephone encounter dated 5.6.25.

## 2024-05-06 ENCOUNTER — Ambulatory Visit: Admitting: Pulmonary Disease

## 2024-05-06 ENCOUNTER — Ambulatory Visit

## 2024-05-06 DIAGNOSIS — G44209 Tension-type headache, unspecified, not intractable: Secondary | ICD-10-CM

## 2024-05-06 DIAGNOSIS — G43909 Migraine, unspecified, not intractable, without status migrainosus: Secondary | ICD-10-CM

## 2024-05-06 MED ORDER — ERENUMAB-AOOE 140 MG/ML ~~LOC~~ SOAJ
140.0000 mg | Freq: Once | SUBCUTANEOUS | Status: AC
Start: 2024-05-06 — End: 2024-05-06
  Administered 2024-05-06: 140 mg via SUBCUTANEOUS

## 2024-05-06 NOTE — Progress Notes (Signed)
 Pt came in today for teaching on how to give aimovig. She brought in her aimovig from home,  Lot number [id number] Exp 05/29/2025 Pt keep medication refrigerated until she brought it here.  Pt tolerated injection well, it was given in the right thigh,

## 2024-05-12 ENCOUNTER — Telehealth: Payer: Self-pay | Admitting: Neurology

## 2024-05-12 NOTE — Telephone Encounter (Signed)
 Pt. Is having side effects from shot of AIMOVIG  throwing up and diarrhea, would like a call for concerns

## 2024-05-12 NOTE — Telephone Encounter (Signed)
 Agree, very unlikely, can monitor on next injection if she has similar reaction. Thanks

## 2024-05-12 NOTE — Telephone Encounter (Signed)
 Spoke with pt husband Ron side effect of Amovig are constipation she had vomiting and diarrhea on Friday night but that was after her son cooked her some very spicy (experimental recipe ) chicken per her husband, she was up and down again Tuesday night with diarrhea after eating a spicy hamburger from [employer], no headache noted during this, she is not seeing a GI doctor, pt husband was advised that she needs to eat a bland diet and that this was not from the medication.

## 2024-05-13 ENCOUNTER — Other Ambulatory Visit: Payer: Self-pay | Admitting: Rheumatology

## 2024-05-13 DIAGNOSIS — M1A0720 Idiopathic chronic gout, left ankle and foot, without tophus (tophi): Secondary | ICD-10-CM

## 2024-05-13 NOTE — Telephone Encounter (Signed)
 Last Fill: 02/09/2024  Labs: 02/26/2024 Glucose is elevated at 155 which is probably not fasting.  Sodium is low and stable.  CBC is normal.  Uric acid is within normal limits.   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

## 2024-05-18 ENCOUNTER — Ambulatory Visit: Payer: Self-pay | Admitting: Neurology

## 2024-05-18 ENCOUNTER — Encounter: Payer: Self-pay | Admitting: Neurology

## 2024-05-18 VITALS — BP 188/78 | HR 93 | Ht 67.0 in | Wt 161.4 lb

## 2024-05-18 DIAGNOSIS — G43909 Migraine, unspecified, not intractable, without status migrainosus: Secondary | ICD-10-CM | POA: Diagnosis not present

## 2024-05-18 DIAGNOSIS — G44309 Post-traumatic headache, unspecified, not intractable: Secondary | ICD-10-CM | POA: Diagnosis not present

## 2024-05-18 MED ORDER — GABAPENTIN 100 MG PO CAPS: ORAL_CAPSULE | ORAL | 3 refills | Status: DC

## 2024-05-18 MED ORDER — RIVASTIGMINE TARTRATE 3 MG PO CAPS: ORAL_CAPSULE | ORAL | 3 refills | Status: DC
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
## 2024-05-18 NOTE — Patient Instructions (Signed)
 Good to see you.  Continue Aimovig  once a month. Let's give it some time to work  Continue Gabapentin  100mg : take 2 capsules twice a day and Rivastigmine  3mg  twice a day  3. You can take as needed Tylenol , Advil, or Aleve only 3 times a week to help with the headaches  4. Keep a calendar of headaches  5. Follow-up in 6 months, call for any changes

## 2024-05-19 NOTE — Telephone Encounter (Signed)
 Andrea Heath

## 2024-05-21 ENCOUNTER — Telehealth: Payer: Self-pay | Admitting: Neurology

## 2024-05-21 ENCOUNTER — Other Ambulatory Visit: Payer: Self-pay | Admitting: Rheumatology

## 2024-05-21 NOTE — Telephone Encounter (Signed)
 Pt's son called in wanting to give Dr. Ty Gales an update on the pt's blood pressure. Her blood pressure is 112/68.

## 2024-05-26 ENCOUNTER — Telehealth: Payer: Self-pay | Admitting: Neurology

## 2024-05-26 DIAGNOSIS — G43909 Migraine, unspecified, not intractable, without status migrainosus: Secondary | ICD-10-CM | POA: Diagnosis not present

## 2024-05-26 DIAGNOSIS — I10 Essential (primary) hypertension: Secondary | ICD-10-CM | POA: Diagnosis not present

## 2024-05-26 DIAGNOSIS — R059 Cough, unspecified: Secondary | ICD-10-CM | POA: Diagnosis not present

## 2024-05-26 NOTE — Telephone Encounter (Signed)
 Pt husband called he stated that she had a headache yesterday and she said she was coughing up some blood and then this morning she woke up coughing up blood. Its red in color its not dark, he was advised that he needs to get her in with the PCP to have that evaluated or to an urgent care or ER. Ron was asked if he ate anything last night or this morning that could of scratched her throat he stated no. He said that he was calling that PCP to get her an appointment if he can't get her in with them he will get her to urgent care or the ER. He asked if this was a side effect of amiovig, he was advised that this was not a side effect that we know of,

## 2024-05-26 NOTE — Telephone Encounter (Signed)
 Her last dose of Aimovig  was 3 weeks ago, this would not be from Aimovig . Agree with PCP or UC. Thanks

## 2024-05-26 NOTE — Telephone Encounter (Signed)
 Pt. Husband called expressing wife is coughing up blood on voicemail, forwarded to Nurse [employer]

## 2024-05-29 DIAGNOSIS — E1169 Type 2 diabetes mellitus with other specified complication: Secondary | ICD-10-CM | POA: Diagnosis not present

## 2024-05-29 DIAGNOSIS — F0280 Dementia in other diseases classified elsewhere without behavioral disturbance: Secondary | ICD-10-CM | POA: Diagnosis not present

## 2024-05-29 DIAGNOSIS — E039 Hypothyroidism, unspecified: Secondary | ICD-10-CM | POA: Diagnosis not present

## 2024-05-29 DIAGNOSIS — E7800 Pure hypercholesterolemia, unspecified: Secondary | ICD-10-CM | POA: Diagnosis not present

## 2024-06-02 NOTE — Telephone Encounter (Signed)
 Andrea Heath

## 2024-06-03 ENCOUNTER — Ambulatory Visit

## 2024-06-03 DIAGNOSIS — G43909 Migraine, unspecified, not intractable, without status migrainosus: Secondary | ICD-10-CM

## 2024-06-03 DIAGNOSIS — G44209 Tension-type headache, unspecified, not intractable: Secondary | ICD-10-CM

## 2024-06-03 DIAGNOSIS — G44309 Post-traumatic headache, unspecified, not intractable: Secondary | ICD-10-CM

## 2024-06-03 MED ORDER — ERENUMAB-AOOE 140 MG/ML ~~LOC~~ SOAJ
140.0000 mg | Freq: Once | SUBCUTANEOUS | Status: AC
  Administered 2024-06-03: 140 mg via SUBCUTANEOUS

## 2024-06-30 ENCOUNTER — Ambulatory Visit

## 2024-06-30 DIAGNOSIS — G44209 Tension-type headache, unspecified, not intractable: Secondary | ICD-10-CM

## 2024-06-30 DIAGNOSIS — G43909 Migraine, unspecified, not intractable, without status migrainosus: Secondary | ICD-10-CM

## 2024-06-30 MED ORDER — ERENUMAB-AOOE 140 MG/ML ~~LOC~~ SOAJ
140.0000 mg | Freq: Once | SUBCUTANEOUS | Status: AC
  Administered 2024-06-30: 140 mg via SUBCUTANEOUS

## 2024-07-06 ENCOUNTER — Ambulatory Visit: Payer: Medicare HMO | Admitting: Neurology

## 2024-07-12 ENCOUNTER — Ambulatory Visit: Payer: Self-pay | Admitting: Physician Assistant

## 2024-07-13 ENCOUNTER — Ambulatory Visit: Payer: Self-pay | Admitting: Physician Assistant

## 2024-07-29 ENCOUNTER — Ambulatory Visit

## 2024-07-29 DIAGNOSIS — G43909 Migraine, unspecified, not intractable, without status migrainosus: Secondary | ICD-10-CM

## 2024-07-29 MED ORDER — ERENUMAB-AOOE 140 MG/ML ~~LOC~~ SOAJ
140.0000 mg | Freq: Once | SUBCUTANEOUS | Status: AC
  Administered 2024-07-29: 140 mg via SUBCUTANEOUS

## 2024-07-29 NOTE — Progress Notes (Unsigned)
 Administered Aimovig  injection on the right fontal thigh. pt supplied med but is deffective. used office [phone] LOT: [id number]. Pt tolerated well.
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
## 2024-08-19 ENCOUNTER — Telehealth: Payer: Self-pay | Admitting: Neurology

## 2024-08-19 NOTE — Telephone Encounter (Signed)
 PT.s husband set up Aimovig  Injection on   08/26/2024 Status:   Time: 10:45 AM

## 2024-08-26 ENCOUNTER — Ambulatory Visit

## 2024-08-26 ENCOUNTER — Ambulatory Visit: Payer: HMO | Attending: Rheumatology | Admitting: Rheumatology

## 2024-08-26 ENCOUNTER — Encounter: Payer: Self-pay | Admitting: Rheumatology

## 2024-08-26 VITALS — BP 147/82 | HR 78 | Resp 14 | Wt 153.2 lb

## 2024-08-26 DIAGNOSIS — L821 Other seborrheic keratosis: Secondary | ICD-10-CM

## 2024-08-26 DIAGNOSIS — R5383 Other fatigue: Secondary | ICD-10-CM

## 2024-08-26 DIAGNOSIS — M79642 Pain in left hand: Secondary | ICD-10-CM

## 2024-08-26 DIAGNOSIS — M349 Systemic sclerosis, unspecified: Secondary | ICD-10-CM

## 2024-08-26 DIAGNOSIS — M1A0720 Idiopathic chronic gout, left ankle and foot, without tophus (tophi): Secondary | ICD-10-CM | POA: Diagnosis not present

## 2024-08-26 DIAGNOSIS — E559 Vitamin D deficiency, unspecified: Secondary | ICD-10-CM

## 2024-08-26 DIAGNOSIS — M4004 Postural kyphosis, thoracic region: Secondary | ICD-10-CM

## 2024-08-26 DIAGNOSIS — Z8639 Personal history of other endocrine, nutritional and metabolic disease: Secondary | ICD-10-CM | POA: Diagnosis not present

## 2024-08-26 DIAGNOSIS — G43909 Migraine, unspecified, not intractable, without status migrainosus: Secondary | ICD-10-CM

## 2024-08-26 DIAGNOSIS — R413 Other amnesia: Secondary | ICD-10-CM

## 2024-08-26 DIAGNOSIS — Z8679 Personal history of other diseases of the circulatory system: Secondary | ICD-10-CM

## 2024-08-26 DIAGNOSIS — M810 Age-related osteoporosis without current pathological fracture: Secondary | ICD-10-CM

## 2024-08-26 DIAGNOSIS — M7062 Trochanteric bursitis, left hip: Secondary | ICD-10-CM

## 2024-08-26 DIAGNOSIS — M7061 Trochanteric bursitis, right hip: Secondary | ICD-10-CM | POA: Diagnosis not present

## 2024-08-26 DIAGNOSIS — I7300 Raynaud's syndrome without gangrene: Secondary | ICD-10-CM

## 2024-08-26 MED ORDER — ERENUMAB-AOOE 140 MG/ML ~~LOC~~ SOAJ
140.0000 mg | Freq: Once | SUBCUTANEOUS | Status: AC
  Administered 2024-08-26: 140 mg via SUBCUTANEOUS

## 2024-08-26 NOTE — Patient Instructions (Addendum)
---  Please have repeat DEXA scan scheduled through your PCP  ---Please call Dr. Okey (cardiologist) to schedule an appointment.  [facility] [address] [place], [place] [address]  [phone]  ---Please call Dr. Jiles office (pulmonologist) to schedule an appointment.  [facility] [address] [place], [place] [address]  [phone]

## 2024-09-07 ENCOUNTER — Telehealth: Payer: Self-pay | Admitting: *Deleted

## 2024-09-07 NOTE — Telephone Encounter (Signed)
 Called the PT no answer left VM

## 2024-09-07 NOTE — Telephone Encounter (Signed)
-----   Message from Praveen Mannam sent at 08/27/2024  9:39 AM EDT ----- Please make routine follow up visit with me ----- Message ----- From: Yvone Daved BROCKS, CMA Sent: 08/26/2024  10:31 AM EDT To: Vina Okey GAILS, MD; Lonna Coder, MD

## 2024-09-23 ENCOUNTER — Ambulatory Visit (INDEPENDENT_AMBULATORY_CARE_PROVIDER_SITE_OTHER)

## 2024-09-23 DIAGNOSIS — G43909 Migraine, unspecified, not intractable, without status migrainosus: Secondary | ICD-10-CM

## 2024-09-23 MED ORDER — ERENUMAB-AOOE 140 MG/ML ~~LOC~~ SOAJ
140.0000 mg | Freq: Once | SUBCUTANEOUS | Status: AC
  Administered 2024-09-23: 140 mg via SUBCUTANEOUS

## 2024-09-23 NOTE — Progress Notes (Signed)
 Administered Aimovig  140 mg/ml injection left frontal  leg. Pt tolerated well.

## 2024-10-12 DIAGNOSIS — M349 Systemic sclerosis, unspecified: Secondary | ICD-10-CM | POA: Diagnosis not present

## 2024-10-12 DIAGNOSIS — Z23 Encounter for immunization: Secondary | ICD-10-CM | POA: Diagnosis not present

## 2024-10-12 DIAGNOSIS — E119 Type 2 diabetes mellitus without complications: Secondary | ICD-10-CM | POA: Diagnosis not present

## 2024-10-12 DIAGNOSIS — Z6827 Body mass index (BMI) 27.0-27.9, adult: Secondary | ICD-10-CM | POA: Diagnosis not present

## 2024-10-12 DIAGNOSIS — R634 Abnormal weight loss: Secondary | ICD-10-CM | POA: Diagnosis not present

## 2024-10-12 DIAGNOSIS — F0280 Dementia in other diseases classified elsewhere without behavioral disturbance: Secondary | ICD-10-CM | POA: Diagnosis not present

## 2024-10-12 DIAGNOSIS — I10 Essential (primary) hypertension: Secondary | ICD-10-CM | POA: Diagnosis not present

## 2024-10-12 DIAGNOSIS — G309 Alzheimer's disease, unspecified: Secondary | ICD-10-CM | POA: Diagnosis not present

## 2024-10-12 DIAGNOSIS — Z0000 Encounter for general adult medical examination without abnormal findings: Secondary | ICD-10-CM | POA: Diagnosis not present

## 2024-10-12 DIAGNOSIS — Z8639 Personal history of other endocrine, nutritional and metabolic disease: Secondary | ICD-10-CM | POA: Diagnosis not present

## 2024-10-12 DIAGNOSIS — E039 Hypothyroidism, unspecified: Secondary | ICD-10-CM | POA: Diagnosis not present

## 2024-10-15 NOTE — Progress Notes (Signed)
 Routine follow-up scheduled per Dr Theophilus.

## 2024-10-26 ENCOUNTER — Ambulatory Visit

## 2024-10-27 ENCOUNTER — Ambulatory Visit

## 2024-10-27 DIAGNOSIS — G44209 Tension-type headache, unspecified, not intractable: Secondary | ICD-10-CM

## 2024-10-27 MED ORDER — ERENUMAB-AOOE 140 MG/ML ~~LOC~~ SOAJ
140.0000 mg | Freq: Once | SUBCUTANEOUS | Status: AC
  Administered 2024-10-27: 140 mg via SUBCUTANEOUS

## 2024-11-02 DIAGNOSIS — E871 Hypo-osmolality and hyponatremia: Secondary | ICD-10-CM | POA: Diagnosis not present

## 2024-11-02 LAB — LAB REPORT - SCANNED
A1c: 6.1
EGFR: 75
TSH: 1.09

## 2024-11-10 ENCOUNTER — Other Ambulatory Visit: Payer: Self-pay | Admitting: Rheumatology

## 2024-11-10 ENCOUNTER — Telehealth: Payer: Self-pay

## 2024-11-10 NOTE — Telephone Encounter (Signed)
 Last Fill: 05/23/2024  Labs: 02/26/2024 Glucose is elevated at 155 which is probably not fasting.  Sodium is low and stable.  CBC is normal.  Uric acid is within normal limits.   Next Visit: 03/01/2025  Last Visit: 08/26/2024  DX: Idiopathic chronic gout of left foot without tophus   Current Dose per office note on 08/26/2024: Off allopurinol .    Called patient to confirm if she is taking allopurinol  and patient's husband checked her meds and verified she is taking allopurinol  1 tablet daily.  He also reported that patient has had recent labs at PCP. I called PCP to request labs.   Okay to refill allopurinol ?

## 2024-11-10 NOTE — Telephone Encounter (Signed)
 Labs received from: [facility]   Drawn on: 11/02/2024, 10/12/2024  Reviewed by: Dr. Dolphus  Labs drawn:    BMP (11/02/2024)     10/12/2024 CBC w/diff   Hemoglobin A1c   CMP   Ferritin   TSH   Lipid panel w/ reflex  Results: 11/02/2024 BMP: glucose 270, sodium 135  10/12/2024:   A1c 6.1  Sodium 129  Chloride 97   Copy of labs sent to the scan center.

## 2024-11-18 ENCOUNTER — Ambulatory Visit: Admitting: Neurology

## 2024-11-18 ENCOUNTER — Encounter: Payer: Self-pay | Admitting: Neurology

## 2024-11-18 VITALS — BP 174/88 | HR 75 | Ht 67.0 in | Wt 158.0 lb

## 2024-11-18 DIAGNOSIS — G43909 Migraine, unspecified, not intractable, without status migrainosus: Secondary | ICD-10-CM | POA: Diagnosis not present

## 2024-11-18 DIAGNOSIS — G44309 Post-traumatic headache, unspecified, not intractable: Secondary | ICD-10-CM | POA: Diagnosis not present

## 2024-11-18 DIAGNOSIS — R519 Headache, unspecified: Secondary | ICD-10-CM

## 2024-11-18 DIAGNOSIS — G309 Alzheimer's disease, unspecified: Secondary | ICD-10-CM | POA: Diagnosis not present

## 2024-11-18 DIAGNOSIS — F0280 Dementia in other diseases classified elsewhere without behavioral disturbance: Secondary | ICD-10-CM

## 2024-11-18 MED ORDER — NURTEC 75 MG PO TBDP
ORAL_TABLET | ORAL | 11 refills | Status: AC
Start: 2024-11-18 — End: ?

## 2024-11-18 MED ORDER — GABAPENTIN 100 MG PO CAPS: ORAL_CAPSULE | ORAL | 3 refills | Status: AC

## 2024-11-18 MED ORDER — RIVASTIGMINE TARTRATE 3 MG PO CAPS: ORAL_CAPSULE | ORAL | 3 refills | Status: AC

## 2024-11-18 NOTE — Patient Instructions (Signed)
 Good to see you.  Schedule MRI brain without contrast  2. Stop the Aimovig . A prescription was sent for Nurtec 75mg : take 1 tablet every other day to hopefully cut down on the headaches  3. Continue all your other medications  4. Please check if she is taking Citalopram  (Celexa ), if not, I would recommend we restart it  5. Follow-up in 3 months, call for any changes
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
## 2024-11-21 ENCOUNTER — Other Ambulatory Visit: Payer: Self-pay | Admitting: Rheumatology

## 2024-11-21 DIAGNOSIS — M1A0720 Idiopathic chronic gout, left ankle and foot, without tophus (tophi): Secondary | ICD-10-CM

## 2024-11-22 ENCOUNTER — Other Ambulatory Visit: Payer: Self-pay

## 2024-11-22 DIAGNOSIS — M1A0720 Idiopathic chronic gout, left ankle and foot, without tophus (tophi): Secondary | ICD-10-CM

## 2024-11-22 MED ORDER — COLCHICINE 0.6 MG PO TABS: 0.6000 mg | ORAL_TABLET | Freq: Every day | ORAL | 0 refills | Status: AC | PRN

## 2024-11-22 NOTE — Telephone Encounter (Signed)
 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   Okay to refill colchicine ?

## 2024-12-03 ENCOUNTER — Encounter: Payer: Self-pay | Admitting: Pulmonary Disease

## 2024-12-03 ENCOUNTER — Ambulatory Visit: Admitting: Pulmonary Disease

## 2024-12-10 ENCOUNTER — Ambulatory Visit
Admission: RE | Admit: 2024-12-10 | Discharge: 2024-12-10 | Disposition: A | Source: Ambulatory Visit | Attending: Neurology

## 2024-12-13 ENCOUNTER — Ambulatory Visit: Payer: Self-pay | Admitting: Neurology

## 2024-12-17 NOTE — Telephone Encounter (Signed)
-----   Message from Darice Shivers, MD sent at 12/13/2024  1:15 PM EST ----- Pls let husband know the good news that the brain MRI looks good. No tumor, stroke, or bleed. It shows age-related changes. How is she doing with the every other day Nurtec? Thanks

## 2024-12-17 NOTE — Telephone Encounter (Signed)
 Pt called an informed that the brain MRI looks good. No tumor, stroke, or bleed. It shows age-related changes. How is she doing with the every other day Nurtec? He said that he thinks that its helping her,  Nose Bleed is constant she went to ENT yesterday had her nose coterised had nose bleed last night and this morning, they told her to use 2 nasal sprays she goes back to see him in a few days. She has not been using the nose spray as she was instructed, advised that if she uses it twice a day like he said the dr told him too then it should help. He said he aggress and that he got her some this morning to use,   He said that its hard to tell when she has one and when she doesn't have them , he said some times she tells you and some times she doesn't. He said that headaches affect her hearing.

## 2025-01-20 ENCOUNTER — Other Ambulatory Visit (HOSPITAL_COMMUNITY): Payer: Self-pay

## 2025-01-20 ENCOUNTER — Telehealth: Payer: Self-pay | Admitting: Neurology

## 2025-01-20 NOTE — Telephone Encounter (Signed)
 Pt's husband Ron called in this morning. Ron stated that they are trying to get the co payment reduce   Rimegepant Sulfate (NURTEC) 75 MG TBDP  the insurance is requesting some addition information.  Pt Please call. Thanks

## 2025-01-20 NOTE — Telephone Encounter (Signed)
 New insurance for the new year so this is a transition paid claim and a PA will ultimately be needed which I can start. However patient is likely to benefit from the Medicare Prescription Payment Plan (MPPP) program. They will need to call their insurance to get set up on this plan to help reduce their monthly cost of medication.   Cost to the patient currently is $655.38 which will go towards the $2000 out of pocket expense they have to pay for the year. If they call their insurance and ask about the (MPPP) they can break that amout up in to smaller amount. The insurance rep can explain it the plan to them more.

## 2025-01-21 ENCOUNTER — Telehealth: Payer: Self-pay

## 2025-01-21 NOTE — Telephone Encounter (Signed)
 Pt needs a PA for her  rivastigmine  3 mg

## 2025-01-21 NOTE — Telephone Encounter (Signed)
 Pt called no answer left a voice mail to call the office back

## 2025-01-25 NOTE — Telephone Encounter (Signed)
 Spoke with pt husband informed him he needs to call member services on the back of the card to get enrolled in Medicare Prescription Payment Plan (MPPP) program.

## 2025-01-25 NOTE — Telephone Encounter (Signed)
 Following up on PA.

## 2025-01-25 NOTE — Telephone Encounter (Signed)
 Spouse left vm wanting to speak with Powell regarding Rx refill for rivastigmine . He stated that its extravagantly high, and would like to try to get an adjustment on the price. He would like to also talk about he Nurtec 75 mg.   PH: [phone]

## 2025-01-27 ENCOUNTER — Telehealth: Payer: Self-pay | Admitting: Neurology

## 2025-01-27 NOTE — Telephone Encounter (Signed)
 PT's husband called again about pricing of Nurtec, explained per Nurse Powell Sermon with pt husband informed him he needs to call member services on the back of the card to get enrolled in Medicare Prescription Payment Plan (MPPP) program.  He would like a call back as he did not remember this information being provided.

## 2025-01-28 NOTE — Telephone Encounter (Signed)
 Pt husband stated that he had been talking to medicare and they told him that Medicare Prescription Payment Plan was for poor people he probably would not qualify for it, he was advised that we have been informed by drug reps to have our patients of all class to apply for this help who are on medicare he said that he would reach back out to get the forms. And that they would talk to Dr Georjean in Feb about options for medication.

## 2025-01-31 ENCOUNTER — Telehealth: Payer: Self-pay | Admitting: Pharmacy Technician

## 2025-01-31 ENCOUNTER — Other Ambulatory Visit (HOSPITAL_COMMUNITY): Payer: Self-pay

## 2025-01-31 NOTE — Telephone Encounter (Signed)
 Pharmacy Patient Advocate Encounter   Received notification from Pt Calls Messages that prior authorization for RIVASTIGMINE  3MG  is required/requested.   Insurance verification completed.   The patient is insured through [employer].   Per test claim: PA required; PA submitted to above mentioned insurance via Latent Key/confirmation #/EOC [facility] Status is pending

## 2025-02-02 ENCOUNTER — Telehealth: Payer: Self-pay | Admitting: Neurology

## 2025-02-02 NOTE — Telephone Encounter (Signed)
 Prentice.  From ( [employer])  called and stated that he needs prior authorization for the prescription called: Rivastigmine . Thanks

## 2025-02-08 ENCOUNTER — Ambulatory Visit: Admitting: Neurology

## 2025-03-01 ENCOUNTER — Ambulatory Visit: Admitting: Rheumatology
